# Patient Record
Sex: Female | Born: 1950 | Race: Asian | Hispanic: No | Marital: Single | Smoking: Current every day smoker
Health system: Southern US, Community
[De-identification: ages and names within clinical notes are randomized; demographics above are authoritative.]

## PROBLEM LIST (undated history)

## (undated) DIAGNOSIS — I2510 Atherosclerotic heart disease of native coronary artery without angina pectoris: Secondary | ICD-10-CM

## (undated) DIAGNOSIS — I5020 Unspecified systolic (congestive) heart failure: Secondary | ICD-10-CM

## (undated) DIAGNOSIS — K743 Primary biliary cirrhosis: Secondary | ICD-10-CM

## (undated) DIAGNOSIS — K7460 Unspecified cirrhosis of liver: Secondary | ICD-10-CM

## (undated) DIAGNOSIS — G510 Bell's palsy: Secondary | ICD-10-CM

## (undated) HISTORY — DX: Primary biliary cirrhosis: K74.3

## (undated) HISTORY — DX: Unspecified systolic (congestive) heart failure: I50.20

## (undated) HISTORY — PX: FRACTURE SURGERY: SHX138

## (undated) HISTORY — PX: CLAVICLE SURGERY: SHX598

---

## 2007-01-09 ENCOUNTER — Emergency Department (HOSPITAL_COMMUNITY): Admission: EM | Admit: 2007-01-09 | Discharge: 2007-01-09 | Payer: Self-pay | Admitting: Emergency Medicine

## 2007-01-13 ENCOUNTER — Ambulatory Visit (HOSPITAL_COMMUNITY): Admission: RE | Admit: 2007-01-13 | Discharge: 2007-01-13 | Payer: Self-pay | Admitting: Orthopedic Surgery

## 2010-12-03 NOTE — Consult Note (Signed)
NAMEEKTA, DANCER NO.:  [id number]   MEDICAL RECORD NO.:  [id number]          PATIENT TYPE:  EMS   LOCATION:  MAJO                         FACILITY:  [facility]   PHYSICIAN:  Artist Pais. Weingold, M.D.DATE OF BIRTH:  02/21/1951   DATE OF CONSULTATION:  DATE OF DISCHARGE:                                 CONSULTATION   Physician requesting consultation is Dr. Berenice Bouton   REASON FOR CONSULTATION:  Ms. Helon is a 60 year old right-hand dominant  female who fell on to her outstretched left upper extremity, presents  today with a displaced fracture distal radias, nondominant, left side.  She is 55.   She has no known drug allergies.   She is currently taking no medications.   No recent hospitalizations or surgery.   FAMILY MEDICAL HISTORY:  Noncontributory.   SOCIAL HISTORY:  Noncontributory.   PHYSICAL EXAMINATION:  GENERAL:  Well-nourished female , pleasant, alert  and oriented x3.  EXTREMITIES:  Examination of her upper extremities, she has pain,  swelling.  She is neurovascularly intact grossly with medial,  radial  and ulnar nerve function.  She complains of intermittent subjective  numbness and tingling in the medial distribution, but she has full  muscle function and sharp, dull sensation is intact.  Again, dorsal  swelling is noted with mild deformity.  Right hand exam is normal by  comparison.   X-ray showed a displaced fracture distal radias.   Patient was given a 2% plain lidocaine hematoma block, was placed in  finger-trap traction, was reduced.  Post reduction film showed accurate  reduction about the AP, but the lateral film still showed some residual  dorsal angulation.  She is discharged with Percocet for pain.  Follow up  in my office on Tuesday.  I am going to recommend to her we intervene  with surgical intervention to realign this displaced distal radias  fracture.  She will call me immediately with any signs of impending  compartment  syndrome; if not, I will see her in my office on Tuesday.      Artist Pais Mina Marble, M.D.  Electronically Signed     MAW/MEDQ  D:  01/09/2007  T:  01/10/2007  Job:  [id number]

## 2021-05-01 ENCOUNTER — Other Ambulatory Visit: Payer: Self-pay

## 2021-05-01 ENCOUNTER — Emergency Department (HOSPITAL_BASED_OUTPATIENT_CLINIC_OR_DEPARTMENT_OTHER): Payer: Self-pay

## 2021-05-01 ENCOUNTER — Emergency Department (HOSPITAL_BASED_OUTPATIENT_CLINIC_OR_DEPARTMENT_OTHER)
Admission: EM | Admit: 2021-05-01 | Discharge: 2021-05-01 | Disposition: A | Discharge status: Home / Self Care | Payer: Self-pay | Attending: Emergency Medicine | Admitting: Emergency Medicine

## 2021-05-01 ENCOUNTER — Encounter (HOSPITAL_BASED_OUTPATIENT_CLINIC_OR_DEPARTMENT_OTHER): Payer: Self-pay

## 2021-05-01 DIAGNOSIS — R188 Other ascites: Secondary | ICD-10-CM

## 2021-05-01 DIAGNOSIS — R339 Retention of urine, unspecified: Secondary | ICD-10-CM | POA: Insufficient documentation

## 2021-05-01 DIAGNOSIS — F17210 Nicotine dependence, cigarettes, uncomplicated: Secondary | ICD-10-CM | POA: Insufficient documentation

## 2021-05-01 DIAGNOSIS — K7031 Alcoholic cirrhosis of liver with ascites: Secondary | ICD-10-CM | POA: Insufficient documentation

## 2021-05-01 HISTORY — DX: Bell's palsy: G51.0

## 2021-05-01 LAB — HEPATIC FUNCTION PANEL
ALT: 20 U/L (ref 0–44)
AST: 65 U/L — ABNORMAL HIGH (ref 15–41)
Albumin: 1.7 g/dL — ABNORMAL LOW (ref 3.5–5.0)
Alkaline Phosphatase: 396 U/L — ABNORMAL HIGH (ref 38–126)
Bilirubin, Direct: 3.2 mg/dL — ABNORMAL HIGH (ref 0.0–0.2)
Indirect Bilirubin: 2.7 mg/dL — ABNORMAL HIGH (ref 0.3–0.9)
Total Bilirubin: 5.9 mg/dL — ABNORMAL HIGH (ref 0.3–1.2)
Total Protein: 7.4 g/dL (ref 6.5–8.1)

## 2021-05-01 LAB — BASIC METABOLIC PANEL
Anion gap: 6 (ref 5–15)
BUN: 13 mg/dL (ref 8–23)
CO2: 22 mmol/L (ref 22–32)
Calcium: 7.7 mg/dL — ABNORMAL LOW (ref 8.9–10.3)
Chloride: 105 mmol/L (ref 98–111)
Creatinine, Ser: 0.77 mg/dL (ref 0.44–1.00)
GFR, Estimated: >60 mL/min (ref >60)
Glucose, Bld: 89 mg/dL (ref 70–99)
Potassium: 3.8 mmol/L (ref 3.5–5.1)
Sodium: 133 mmol/L — ABNORMAL LOW (ref 135–145)

## 2021-05-01 LAB — CBC WITH DIFFERENTIAL/PLATELET
Abs Immature Granulocytes: 0.03 10*3/uL (ref 0.00–0.07)
Basophils Absolute: 0 10*3/uL (ref 0.0–0.1)
Basophils Relative: 1 %
Eosinophils Absolute: 0.1 10*3/uL (ref 0.0–0.5)
Eosinophils Relative: 1 %
HCT: 26.1 % — ABNORMAL LOW (ref 36.0–46.0)
Hemoglobin: 8.5 g/dL — ABNORMAL LOW (ref 12.0–15.0)
Immature Granulocytes: 0 %
Lymphocytes Relative: 25 %
Lymphs Abs: 2 10*3/uL (ref 0.7–4.0)
MCH: 30.7 pg (ref 26.0–34.0)
MCHC: 32.6 g/dL (ref 30.0–36.0)
MCV: 94.2 fL (ref 80.0–100.0)
Monocytes Absolute: 0.5 10*3/uL (ref 0.1–1.0)
Monocytes Relative: 6 %
Neutro Abs: 5.4 10*3/uL (ref 1.7–7.7)
Neutrophils Relative %: 67 %
Platelets: 241 10*3/uL (ref 150–400)
RBC: 2.77 MIL/uL — ABNORMAL LOW (ref 3.87–5.11)
RDW: 17.5 % — ABNORMAL HIGH (ref 11.5–15.5)
WBC: 8.1 10*3/uL (ref 4.0–10.5)
nRBC: 0 % (ref 0.0–0.2)

## 2021-05-01 LAB — URINALYSIS, ROUTINE W REFLEX MICROSCOPIC
Glucose, UA: NEGATIVE mg/dL
Hgb urine dipstick: NEGATIVE
Ketones, ur: NEGATIVE mg/dL
Leukocytes,Ua: NEGATIVE
Nitrite: NEGATIVE
Protein, ur: NEGATIVE mg/dL
Specific Gravity, Urine: 1.02 (ref 1.005–1.030)
pH: 5.5 (ref 5.0–8.0)

## 2021-05-01 MED ORDER — IOHEXOL 300 MG/ML  SOLN
100.0000 mL | Freq: Once | INTRAMUSCULAR | Status: AC | PRN
  Administered 2021-05-01: 100 mL via INTRAVENOUS

## 2021-05-01 NOTE — ED Notes (Signed)
Bladder scan shows >930ml, looks like 1500 or more visually on bladder scanner.

## 2021-05-01 NOTE — ED Notes (Signed)
Standard drainage bag changed to leg bag and Pt and grandson educated on use, draining, and cleaning.    D/c paperwork provided to grandson.  Verbalized all instructions and follow-up.

## 2021-05-01 NOTE — ED Notes (Signed)
.  dc

## 2021-05-01 NOTE — ED Triage Notes (Signed)
Per pt and grandson pt with bilat LE swelling and decreased UO x 1 week-denies taking diuretic-NAD-to triage in w/c

## 2021-05-01 NOTE — ED Notes (Signed)
Patient transported to CT 

## 2021-05-01 NOTE — ED Provider Notes (Signed)
[facility] EMERGENCY DEPARTMENT Provider Note   CSN: [id number] Arrival date & time: 05/01/21  1158     History Chief Complaint  Patient presents with   Leg Swelling   Urinary Retention    Hannah Blake is a 70 y.o. female.  Patient presents to the ED with her grandson. Patient with scant urination x 1 week. Lower abdominal distention. Patient denies pain with urination. No prior episodes of urinary retention. Bladder scan shows > 1 liter of urine. Bilateral lower extremity edema. Denies chest pain, shortness of breath. No back pain or extremity weakness. No significant past medical history. No current medications.  The history is provided by the patient and a relative. No language interpreter was used.  Dysuria Pain severity:  No pain Chronicity:  New Recent urinary tract infections: no   Ineffective treatments:  None tried Urinary symptoms comment:  Scant urine Associated symptoms: no fever, no nausea and no vomiting   Risk factors: no hx of pyelonephritis, no hx of urolithiasis, no recurrent urinary tract infections and no urinary catheter       Past Medical History:  Diagnosis Date   Bell's palsy     There are no problems to display for this patient.   Past Surgical History:  Procedure Laterality Date   CLAVICLE SURGERY       OB History   No obstetric history on file.     No family history on file.  Social History   Tobacco Use   Smoking status: Every Day    Types: Cigarettes   Smokeless tobacco: Never  Vaping Use   Vaping Use: Never used  Substance Use Topics   Alcohol use: Yes    Comment: weekly   Drug use: Never    Home Medications Prior to Admission medications   Not on File    Allergies    Patient has no known allergies.  Review of Systems   Review of Systems  Constitutional:  Negative for fever.  Cardiovascular:  Positive for leg swelling.  Gastrointestinal:  Negative for nausea and vomiting.  Genitourinary:  Positive  for dysuria.  All other systems reviewed and are negative.  Physical Exam Updated Vital Signs BP 116/70 (BP Location: Right Arm)   Pulse 95   Temp 97.8 F (36.6 C) (Oral)   Resp 18   Ht 5\' 2"  (1.575 m)   Wt 55.8 kg   SpO2 95%   BMI 22.50 kg/m   Physical Exam Constitutional:      Appearance: She is not ill-appearing.  HENT:     Head: Normocephalic.     Nose: Nose normal.     Mouth/Throat:     Mouth: Mucous membranes are moist.  Cardiovascular:     Rate and Rhythm: Normal rate and regular rhythm.  Pulmonary:     Effort: Pulmonary effort is normal.     Breath sounds: Normal breath sounds.  Abdominal:     General: There is distension.     Palpations: Abdomen is soft.  Genitourinary:    Vagina: No vaginal discharge.  Musculoskeletal:        General: Swelling present.  Skin:    General: Skin is warm and dry.  Neurological:     Mental Status: She is alert and oriented to person, place, and time.  Psychiatric:        Mood and Affect: Mood normal.        Behavior: Behavior normal.    ED Results / Procedures / Treatments  Labs (all labs ordered are listed, but only abnormal results are displayed) Labs Reviewed  CBC WITH DIFFERENTIAL/PLATELET - Abnormal; Notable for the following components:      Result Value   RBC 2.77 (*)    Hemoglobin 8.5 (*)    HCT 26.1 (*)    RDW 17.5 (*)    All other components within normal limits  BASIC METABOLIC PANEL - Abnormal; Notable for the following components:   Sodium 133 (*)    Calcium 7.7 (*)    All other components within normal limits  URINALYSIS, ROUTINE W REFLEX MICROSCOPIC - Abnormal; Notable for the following components:   Color, Urine AMBER (*)    Bilirubin Urine LARGE (*)    All other components within normal limits    EKG None  Radiology CT ABDOMEN PELVIS W CONTRAST  Result Date: 05/01/2021 CLINICAL DATA:  Urinary tension, decreased urine output for 1 week, BILATERAL lower extremity swelling, abdominal  distension, smoker EXAM: CT ABDOMEN AND PELVIS WITH CONTRAST TECHNIQUE: Multidetector CT imaging of the abdomen and pelvis was performed using the standard protocol following bolus administration of intravenous contrast. Sagittal and coronal MPR images reconstructed from axial data set. CONTRAST:  OMNIPAQUE IOHEXOL 300 MG/ML SOLN IV. No oral contrast. COMPARISON:  None FINDINGS: Lower chest: Minimal at bibasilar atelectasis Hepatobiliary: Slightly irregular contour of the liver suggesting cirrhosis. Single tiny low-attenuation nodule lateral segment LEFT lobe liver 5 mm diameter image 28. Dependent calculi in gallbladder. No additional hepatic mass or biliary dilatation. Pancreas: Normal appearance Spleen: Appears upper normal sized, elongated but thin, 10.6 x 4.8 x 14.6 cm (volume = 390 ml) Adrenals/Urinary Tract: Adrenal glands and kidneys normal appearance. No ureteral calcifications or hydronephrosis. Bladder decompressed by Foley catheter. Stomach/Bowel: Stomach decompressed, grossly unremarkable. Large and small bowel loops normal appearance. Appendix not identified. Vascular/Lymphatic: Atherosclerotic calcifications aorta and iliac arteries without aneurysm. Upper normal sized lymph nodes periportal and aortocaval, nonspecific. No definite adenopathy. Reproductive: Atrophic uterus and RIGHT ovary. LEFT ovary appears enlarged versus RIGHT ovary and contains a 2.9 cm diameter cyst, coexistent solid mass not excluded. Other: Significant ascites throughout abdomen and pelvis. Ascites extends into a paraesophageal hernia, small volume. Scattered subcutaneous edema. Musculoskeletal: Osseous demineralization. Superior endplate compression fractures of T6, T8, L2, and L5, age indeterminate. Levoconvex thoracolumbar scoliosis. IMPRESSION: Question cirrhotic liver with significant ascites throughout abdomen and pelvis. Cholelithiasis. Small paraesophageal hernia containing ascites. Age indeterminate superior  endplate compression fractures of T6, T8, L2, and L5. Asymmetric enlargement of LEFT ovary containing a 2.9 cm diameter cyst, unable to exclude solid soft tissue component/complicated cystic lesion; follow-up sonographic evaluation recommended. Aortic Atherosclerosis (ICD10-I70.0). Electronically Signed   By: Ulyses Southward M.D.   On: 05/01/2021 17:55    Procedures Procedures   Medications Ordered in ED Medications - No data to display  ED Course  I have reviewed the triage vital signs and the nursing notes.  Pertinent labs & imaging results that were available during my care of the patient were reviewed by me and considered in my medical decision making (see chart for details).  Foley catheter placed by nursing staff. Output of 300 cc.    MDM Rules/Calculators/A&P                           Patient presented for abdominal distention and difficulty urinating. Bladder scan revealed > 999 ml. Foley cath placed by nursing staff with mild difficulty, returning 300 cc of discolored urine containing  bilirubin. Patient indicates immediate relief of discomfort. Repeat bladder scan still reveals retention. No indication of UTI. CT scan of abdomen obtained. Results reviewed and reveal likely cirrhotic liver with ascites and gall stones. Patient is only an occasional consumer of alcohol. Patient without abdominal pain,nausea, vomiting. Stable vital signs. Patient does not want to stay for additional lab testing. Discussed with Dr. Rubin Payor. Will add liver function and hepatitis screening labs. Patient to follow-up with urology and gastroenterology. Care instructions and return precautions provided. Patient appear safe for discharge at this time. Final Clinical Impression(s) / ED Diagnoses Final diagnoses:  Urinary retention  Cirrhosis of liver with ascites, unspecified hepatic cirrhosis type [facility])    Rx / DC Orders ED Discharge Orders     None        Felicie Morn, NP 05/01/21 2308    Melene Plan, DO 05/04/21 0700

## 2021-05-01 NOTE — Discharge Instructions (Signed)
Please refer to the attached instructions. It is important to follow up with both urology and gastroenterology. Please contact both services tomorrow to schedule follow up appointments. Be sure to let the urologist office know that you have an indwelling urinary catheter.

## 2021-05-03 LAB — HEPATITIS C ANTIBODY: HCV Ab: 0.2 s/co ratio — AB (ref 0.0–0.9)

## 2021-05-03 LAB — HEPATITIS PANEL, ACUTE
Hep A IgM: NONREACTIVE
Hep B C IgM: NONREACTIVE
Hepatitis B Surface Ag: NONREACTIVE

## 2021-05-20 ENCOUNTER — Emergency Department (HOSPITAL_BASED_OUTPATIENT_CLINIC_OR_DEPARTMENT_OTHER): Payer: Medicare Other

## 2021-05-20 ENCOUNTER — Other Ambulatory Visit: Payer: Self-pay

## 2021-05-20 ENCOUNTER — Inpatient Hospital Stay (HOSPITAL_BASED_OUTPATIENT_CLINIC_OR_DEPARTMENT_OTHER)
Admission: EM | Admit: 2021-05-20 | Discharge: 2021-05-23 | DRG: 432 | Disposition: A | Discharge status: Home Health | Payer: Medicare Other | Attending: Internal Medicine | Admitting: Internal Medicine

## 2021-05-20 ENCOUNTER — Encounter (HOSPITAL_BASED_OUTPATIENT_CLINIC_OR_DEPARTMENT_OTHER): Payer: Self-pay

## 2021-05-20 DIAGNOSIS — E43 Unspecified severe protein-calorie malnutrition: Secondary | ICD-10-CM | POA: Diagnosis present

## 2021-05-20 DIAGNOSIS — D62 Acute posthemorrhagic anemia: Secondary | ICD-10-CM | POA: Diagnosis present

## 2021-05-20 DIAGNOSIS — T83511A Infection and inflammatory reaction due to indwelling urethral catheter, initial encounter: Secondary | ICD-10-CM | POA: Diagnosis present

## 2021-05-20 DIAGNOSIS — R310 Gross hematuria: Secondary | ICD-10-CM | POA: Diagnosis present

## 2021-05-20 DIAGNOSIS — E876 Hypokalemia: Secondary | ICD-10-CM | POA: Diagnosis not present

## 2021-05-20 DIAGNOSIS — I5023 Acute on chronic systolic (congestive) heart failure: Secondary | ICD-10-CM | POA: Diagnosis present

## 2021-05-20 DIAGNOSIS — R2681 Unsteadiness on feet: Secondary | ICD-10-CM

## 2021-05-20 DIAGNOSIS — K5900 Constipation, unspecified: Secondary | ICD-10-CM | POA: Diagnosis present

## 2021-05-20 DIAGNOSIS — N179 Acute kidney failure, unspecified: Secondary | ICD-10-CM | POA: Diagnosis present

## 2021-05-20 DIAGNOSIS — E46 Unspecified protein-calorie malnutrition: Secondary | ICD-10-CM | POA: Insufficient documentation

## 2021-05-20 DIAGNOSIS — E869 Volume depletion, unspecified: Secondary | ICD-10-CM | POA: Diagnosis present

## 2021-05-20 DIAGNOSIS — Y846 Urinary catheterization as the cause of abnormal reaction of the patient, or of later complication, without mention of misadventure at the time of the procedure: Secondary | ICD-10-CM | POA: Diagnosis present

## 2021-05-20 DIAGNOSIS — B9620 Unspecified Escherichia coli [E. coli] as the cause of diseases classified elsewhere: Secondary | ICD-10-CM | POA: Diagnosis present

## 2021-05-20 DIAGNOSIS — K7460 Unspecified cirrhosis of liver: Secondary | ICD-10-CM

## 2021-05-20 DIAGNOSIS — N390 Urinary tract infection, site not specified: Secondary | ICD-10-CM | POA: Diagnosis present

## 2021-05-20 DIAGNOSIS — I509 Heart failure, unspecified: Secondary | ICD-10-CM

## 2021-05-20 DIAGNOSIS — Z6822 Body mass index (BMI) 22.0-22.9, adult: Secondary | ICD-10-CM

## 2021-05-20 DIAGNOSIS — R338 Other retention of urine: Secondary | ICD-10-CM

## 2021-05-20 DIAGNOSIS — G510 Bell's palsy: Secondary | ICD-10-CM | POA: Diagnosis present

## 2021-05-20 DIAGNOSIS — E872 Acidosis, unspecified: Secondary | ICD-10-CM | POA: Diagnosis present

## 2021-05-20 DIAGNOSIS — R5381 Other malaise: Secondary | ICD-10-CM | POA: Diagnosis present

## 2021-05-20 DIAGNOSIS — F17210 Nicotine dependence, cigarettes, uncomplicated: Secondary | ICD-10-CM | POA: Diagnosis present

## 2021-05-20 DIAGNOSIS — Z20822 Contact with and (suspected) exposure to covid-19: Secondary | ICD-10-CM | POA: Diagnosis present

## 2021-05-20 DIAGNOSIS — K7031 Alcoholic cirrhosis of liver with ascites: Principal | ICD-10-CM | POA: Diagnosis present

## 2021-05-20 DIAGNOSIS — I5021 Acute systolic (congestive) heart failure: Secondary | ICD-10-CM

## 2021-05-20 DIAGNOSIS — R188 Other ascites: Secondary | ICD-10-CM

## 2021-05-20 DIAGNOSIS — B1920 Unspecified viral hepatitis C without hepatic coma: Secondary | ICD-10-CM | POA: Diagnosis present

## 2021-05-20 DIAGNOSIS — Z79899 Other long term (current) drug therapy: Secondary | ICD-10-CM

## 2021-05-20 DIAGNOSIS — R319 Hematuria, unspecified: Secondary | ICD-10-CM

## 2021-05-20 LAB — LIPASE, BLOOD: Lipase: 51 U/L (ref 11–51)

## 2021-05-20 LAB — RESP PANEL BY RT-PCR (FLU A&B, COVID) ARPGX2
Influenza A by PCR: NEGATIVE
Influenza B by PCR: NEGATIVE
SARS Coronavirus 2 by RT PCR: NEGATIVE

## 2021-05-20 LAB — COMPREHENSIVE METABOLIC PANEL
ALT: 24 U/L (ref 0–44)
AST: 72 U/L — ABNORMAL HIGH (ref 15–41)
Albumin: <1.5 g/dL — ABNORMAL LOW (ref 3.5–5.0)
Alkaline Phosphatase: 323 U/L — ABNORMAL HIGH (ref 38–126)
Anion gap: 9 (ref 5–15)
BUN: 28 mg/dL — ABNORMAL HIGH (ref 8–23)
CO2: 20 mmol/L — ABNORMAL LOW (ref 22–32)
Calcium: 7.7 mg/dL — ABNORMAL LOW (ref 8.9–10.3)
Chloride: 106 mmol/L (ref 98–111)
Creatinine, Ser: 1.8 mg/dL — ABNORMAL HIGH (ref 0.44–1.00)
GFR, Estimated: 30 mL/min — ABNORMAL LOW (ref >60)
Glucose, Bld: 108 mg/dL — ABNORMAL HIGH (ref 70–99)
Potassium: 3.9 mmol/L (ref 3.5–5.1)
Sodium: 135 mmol/L (ref 135–145)
Total Bilirubin: 6 mg/dL — ABNORMAL HIGH (ref 0.3–1.2)
Total Protein: 7.7 g/dL (ref 6.5–8.1)

## 2021-05-20 LAB — CREATININE, SERUM
Creatinine, Ser: 1.41 mg/dL — ABNORMAL HIGH (ref 0.44–1.00)
GFR, Estimated: 40 mL/min — ABNORMAL LOW (ref >60)

## 2021-05-20 LAB — URINALYSIS, MICROSCOPIC (REFLEX): RBC / HPF: >50 RBC/hpf (ref 0–5)

## 2021-05-20 LAB — CBC WITH DIFFERENTIAL/PLATELET
Abs Immature Granulocytes: 0.09 10*3/uL — ABNORMAL HIGH (ref 0.00–0.07)
Basophils Absolute: 0.1 10*3/uL (ref 0.0–0.1)
Basophils Relative: 0 %
Eosinophils Absolute: 0.1 10*3/uL (ref 0.0–0.5)
Eosinophils Relative: 1 %
HCT: 27.8 % — ABNORMAL LOW (ref 36.0–46.0)
Hemoglobin: 9 g/dL — ABNORMAL LOW (ref 12.0–15.0)
Immature Granulocytes: 1 %
Lymphocytes Relative: 17 %
Lymphs Abs: 2.3 10*3/uL (ref 0.7–4.0)
MCH: 31 pg (ref 26.0–34.0)
MCHC: 32.4 g/dL (ref 30.0–36.0)
MCV: 95.9 fL (ref 80.0–100.0)
Monocytes Absolute: 0.6 10*3/uL (ref 0.1–1.0)
Monocytes Relative: 4 %
Neutro Abs: 10.5 10*3/uL — ABNORMAL HIGH (ref 1.7–7.7)
Neutrophils Relative %: 77 %
Platelets: 184 10*3/uL (ref 150–400)
RBC: 2.9 MIL/uL — ABNORMAL LOW (ref 3.87–5.11)
RDW: 18.5 % — ABNORMAL HIGH (ref 11.5–15.5)
WBC: 13.6 10*3/uL — ABNORMAL HIGH (ref 4.0–10.5)
nRBC: 0 % (ref 0.0–0.2)

## 2021-05-20 LAB — LACTIC ACID, PLASMA
Lactic Acid, Venous: 1.5 mmol/L (ref 0.5–1.9)
Lactic Acid, Venous: 1.4 mmol/L (ref 0.5–1.9)

## 2021-05-20 LAB — URINALYSIS, ROUTINE W REFLEX MICROSCOPIC

## 2021-05-20 LAB — BODY FLUID CELL COUNT WITH DIFFERENTIAL
Eos, Fluid: 0 %
Lymphs, Fluid: 51 %
Monocyte-Macrophage-Serous Fluid: 32 % — ABNORMAL LOW (ref 50–90)
Neutrophil Count, Fluid: 17 % (ref 0–25)
Total Nucleated Cell Count, Fluid: 86 cu mm (ref 0–1000)

## 2021-05-20 LAB — PHOSPHORUS: Phosphorus: 4.2 mg/dL (ref 2.5–4.6)

## 2021-05-20 LAB — CBC
HCT: 27 % — ABNORMAL LOW (ref 36.0–46.0)
Hemoglobin: 8.6 g/dL — ABNORMAL LOW (ref 12.0–15.0)
MCH: 31.5 pg (ref 26.0–34.0)
MCHC: 31.9 g/dL (ref 30.0–36.0)
MCV: 98.9 fL (ref 80.0–100.0)
Platelets: 158 K/uL (ref 150–400)
RBC: 2.73 MIL/uL — ABNORMAL LOW (ref 3.87–5.11)
RDW: 18.8 % — ABNORMAL HIGH (ref 11.5–15.5)
WBC: 13.6 K/uL — ABNORMAL HIGH (ref 4.0–10.5)
nRBC: 0 % (ref 0.0–0.2)

## 2021-05-20 LAB — PROTIME-INR
INR: 1.4 — ABNORMAL HIGH (ref 0.8–1.2)
Prothrombin Time: 16.9 seconds — ABNORMAL HIGH (ref 11.4–15.2)

## 2021-05-20 LAB — BRAIN NATRIURETIC PEPTIDE: B Natriuretic Peptide: 689.8 pg/mL — ABNORMAL HIGH (ref 0.0–100.0)

## 2021-05-20 LAB — MAGNESIUM: Magnesium: 2.2 mg/dL (ref 1.7–2.4)

## 2021-05-20 MED ORDER — LIDOCAINE-EPINEPHRINE (PF) 2 %-1:200000 IJ SOLN
10.0000 mL | Freq: Once | INTRAMUSCULAR | Status: AC
  Administered 2021-05-20: 10 mL via INTRADERMAL
  Filled 2021-05-20: qty 20

## 2021-05-20 MED ORDER — ALBUMIN HUMAN 25 % IV SOLN
12.5000 g | Freq: Four times a day (QID) | INTRAVENOUS | Status: AC
  Administered 2021-05-21 (×3): 12.5 g via INTRAVENOUS
  Filled 2021-05-20 (×3): qty 50

## 2021-05-20 MED ORDER — PROCHLORPERAZINE EDISYLATE 10 MG/2ML IJ SOLN: 10.0000 mg | Freq: Four times a day (QID) | INTRAMUSCULAR | Status: DC | PRN

## 2021-05-20 MED ORDER — MELATONIN 3 MG PO TABS
3.0000 mg | ORAL_TABLET | Freq: Every evening | ORAL | Status: DC | PRN
  Administered 2021-05-21: 3 mg via ORAL
  Filled 2021-05-20: qty 1

## 2021-05-20 MED ORDER — ENSURE ENLIVE PO LIQD
237.0000 mL | Freq: Two times a day (BID) | ORAL | Status: DC
  Administered 2021-05-21 – 2021-05-23 (×4): 237 mL via ORAL

## 2021-05-20 MED ORDER — ENOXAPARIN SODIUM 30 MG/0.3ML IJ SOSY
30.0000 mg | PREFILLED_SYRINGE | INTRAMUSCULAR | Status: DC
  Administered 2021-05-21: 30 mg via SUBCUTANEOUS
  Filled 2021-05-20: qty 0.3

## 2021-05-20 MED ORDER — POLYETHYLENE GLYCOL 3350 17 G PO PACK: 17.0000 g | PACK | Freq: Every day | ORAL | Status: DC | PRN

## 2021-05-20 MED ORDER — SODIUM CHLORIDE 0.9 % IV SOLN
1.0000 g | Freq: Once | INTRAVENOUS | Status: AC
  Administered 2021-05-20: 1 g via INTRAVENOUS
  Filled 2021-05-20: qty 10

## 2021-05-20 MED ORDER — SODIUM CHLORIDE 0.9 % IV BOLUS
500.0000 mL | Freq: Once | INTRAVENOUS | Status: AC
  Administered 2021-05-20: 500 mL via INTRAVENOUS

## 2021-05-20 NOTE — ED Notes (Signed)
2nd Lactic acid not need per provider.

## 2021-05-20 NOTE — Care Plan (Signed)
70 yo F with scant prior healthcare but recent urinary retention with new foley, and recently diagnosed (in the ER?) cirrhosis presents with leg swelling, ascites and gross hematuria.  Looks like no medical care until she went to [facility] 3 weeks ago with urinary retention, sent home with Foley and Urology follow up (hasn't seen them yet).  Also incidentally noted to have cirrhosis, I think during that ER visit, was supposed to see GI.    Now returns with ascites and leg swelling.   In the ER, INR 1.4, Tbili 6 (stable from 3 weeks ago), Cr up to 1.8, albumin <1.5, and WBC 13K.  MELD 25.  Dr. Adela Lank spoke with GI Leonides Schanz who recommended tap, his note says 2L removed.  Cell count and culture sent.  GI will consult when she arrives.  HR 90-100, so lactic acid, Bcx and Ucx drawn, started on antibiotics.  To med surg bed.

## 2021-05-20 NOTE — ED Notes (Signed)
At bedside to establish IV access , obtain blood sep as per MD orders. Very pleasant, alert 70yo asian female on stretcher, appears in no distress upon initial encounter, did note LLE swelling, also noted indwelling catheter in place with leg bag. Alert and oriented, follows commands without issues.

## 2021-05-20 NOTE — ED Notes (Signed)
Resting quietly and comfortably, family at bedside, safety measures in place, awaiting test results and further orders.

## 2021-05-20 NOTE — ED Provider Notes (Addendum)
[place] EMERGENCY DEPARTMENT Provider Note   CSN: [id number] Arrival date & time: 05/20/21  1104     History Chief Complaint  Patient presents with   Hematuria   Leg Swelling    Hannah Blake is a 70 y.o. female.  70 yo  F with a chief complaint of lower extremity edema abdominal cramping.  This been going on for about a week or so.  She has been having some worsening symptoms after being seen in the ED for some leg swelling previously.  She has not followed up.  She does not have a doctor that she sees normally in the office.  She had urinary retention and had a Foley catheter placed.  She has not followed up with urology.  Feels that her urine is now gotten bloody.  Denies fevers denies flank pain.  Pain to the abdomen is usually worse when she sits up.  Feels it feels that makes her swelling worse.  Denies difficulty breathing denies chest pain or pressure.  The history is provided by the patient and a relative.  Hematuria This is a new problem. The current episode started more than 2 days ago. The problem occurs constantly. The problem has not changed since onset.Associated symptoms include abdominal pain. Pertinent negatives include no chest pain, no headaches and no shortness of breath. Nothing aggravates the symptoms. Nothing relieves the symptoms. She has tried nothing for the symptoms. The treatment provided no relief.      Past Medical History:  Diagnosis Date   Bell's palsy     There are no problems to display for this patient.   Past Surgical History:  Procedure Laterality Date   CLAVICLE SURGERY     FRACTURE SURGERY       OB History   No obstetric history on file.     No family history on file.  Social History   Tobacco Use   Smoking status: Every Day    Types: Cigarettes   Smokeless tobacco: Never  Vaping Use   Vaping Use: Never used  Substance Use Topics   Alcohol use: Not Currently    Comment: weekly   Drug use: Never    Home  Medications Prior to Admission medications   Not on File    Allergies    Patient has no known allergies.  Review of Systems   Review of Systems  Constitutional:  Negative for chills and fever.  HENT:  Negative for congestion and rhinorrhea.   Eyes:  Negative for redness and visual disturbance.  Respiratory:  Negative for shortness of breath and wheezing.   Cardiovascular:  Positive for leg swelling. Negative for chest pain and palpitations.  Gastrointestinal:  Positive for abdominal distention and abdominal pain. Negative for nausea and vomiting.  Genitourinary:  Positive for hematuria. Negative for dysuria and urgency.  Musculoskeletal:  Negative for arthralgias and myalgias.  Skin:  Negative for pallor and wound.  Neurological:  Negative for dizziness and headaches.   Physical Exam Updated Vital Signs BP 102/63   Pulse 92   Temp 98.6 F (37 C) (Oral)   Resp 13   Ht 5\' 3"  (1.6 m)   Wt 56.9 kg   SpO2 100%   BMI 22.21 kg/m   Physical Exam Vitals and nursing note reviewed.  Constitutional:      General: She is not in acute distress.    Appearance: She is well-developed. She is not diaphoretic.  HENT:     Head: Normocephalic and atraumatic.  Comments: Left facial nerve palsy Eyes:     Pupils: Pupils are equal, round, and reactive to light.  Cardiovascular:     Rate and Rhythm: Normal rate and regular rhythm.     Heart sounds: No murmur heard.   No friction rub. No gallop.  Pulmonary:     Effort: Pulmonary effort is normal.     Breath sounds: No wheezing or rales.  Abdominal:     General: There is distension.     Palpations: Abdomen is soft.     Tenderness: There is no abdominal tenderness.     Comments: No obvious tenderness.   Musculoskeletal:        General: No tenderness.     Cervical back: Normal range of motion and neck supple.  Skin:    General: Skin is warm and dry.  Neurological:     Mental Status: She is alert and oriented to person, place, and  time.  Psychiatric:        Behavior: Behavior normal.    ED Results / Procedures / Treatments   Labs (all labs ordered are listed, but only abnormal results are displayed) Labs Reviewed  URINALYSIS, ROUTINE W REFLEX MICROSCOPIC - Abnormal; Notable for the following components:      Result Value   Color, Urine RED (*)    APPearance TURBID (*)    Glucose, UA   (*)    Value: TEST NOT REPORTED DUE TO COLOR INTERFERENCE OF URINE PIGMENT   Hgb urine dipstick   (*)    Value: TEST NOT REPORTED DUE TO COLOR INTERFERENCE OF URINE PIGMENT   Bilirubin Urine   (*)    Value: TEST NOT REPORTED DUE TO COLOR INTERFERENCE OF URINE PIGMENT   Ketones, ur   (*)    Value: TEST NOT REPORTED DUE TO COLOR INTERFERENCE OF URINE PIGMENT   Protein, ur   (*)    Value: TEST NOT REPORTED DUE TO COLOR INTERFERENCE OF URINE PIGMENT   Nitrite   (*)    Value: TEST NOT REPORTED DUE TO COLOR INTERFERENCE OF URINE PIGMENT   Leukocytes,Ua   (*)    Value: TEST NOT REPORTED DUE TO COLOR INTERFERENCE OF URINE PIGMENT   All other components within normal limits  CBC WITH DIFFERENTIAL/PLATELET - Abnormal; Notable for the following components:   WBC 13.6 (*)    RBC 2.90 (*)    Hemoglobin 9.0 (*)    HCT 27.8 (*)    RDW 18.5 (*)    Neutro Abs 10.5 (*)    Abs Immature Granulocytes 0.09 (*)    All other components within normal limits  COMPREHENSIVE METABOLIC PANEL - Abnormal; Notable for the following components:   CO2 20 (*)    Glucose, Bld 108 (*)    BUN 28 (*)    Creatinine, Ser 1.80 (*)    Calcium 7.7 (*)    Albumin <1.5 (*)    AST 72 (*)    Alkaline Phosphatase 323 (*)    Total Bilirubin 6.0 (*)    GFR, Estimated 30 (*)    All other components within normal limits  PROTIME-INR - Abnormal; Notable for the following components:   Prothrombin Time 16.9 (*)    INR 1.4 (*)    All other components within normal limits  URINALYSIS, MICROSCOPIC (REFLEX) - Abnormal; Notable for the following components:    Bacteria, UA MANY (*)    All other components within normal limits  BODY FLUID CULTURE W GRAM STAIN  RESP  PANEL BY RT-PCR (FLU A&B, COVID) ARPGX2  LIPASE, BLOOD  LACTATE DEHYDROGENASE, PLEURAL OR PERITONEAL FLUID  GLUCOSE, PLEURAL OR PERITONEAL FLUID  PROTEIN, PLEURAL OR PERITONEAL FLUID  ALBUMIN, PLEURAL OR PERITONEAL FLUID   CYTOLOGY - NON PAP    EKG EKG Interpretation  Date/Time:  Monday May 20 2021 11:28:19 EDT Ventricular Rate:  97 PR Interval:  149 QRS Duration: 99 QT Interval:  417 QTC Calculation: 530 R Axis:   39 Text Interpretation: Sinus rhythm Abnrm T, consider ischemia, anterolateral lds Prolonged QT interval No old tracing to compare Confirmed by Melene Plan [phone]) on 05/20/2021 11:30:08 AM  Radiology DG Chest Port 1 View  Result Date: 05/20/2021 CLINICAL DATA:  Acute kidney injury, hematuria, lower extremity swelling EXAM: PORTABLE CHEST 1 VIEW COMPARISON:  None. FINDINGS: Low lung volumes. Mild enlargement of the cardiopericardial silhouette. Atherosclerotic aortic arch. Otherwise normal mediastinal contour. No pneumothorax. No pleural effusion. Moderate eventration of the right hemidiaphragm. No overt pulmonary edema. Streaky bibasilar lung opacities compatible with atelectasis. IMPRESSION: 1. Low lung volumes with streaky bibasilar lung opacities compatible with atelectasis. Moderate eventration of the right hemidiaphragm. 2. Mild enlargement of the cardiopericardial silhouette without overt pulmonary edema. Electronically Signed   By: Delbert Phenix M.D.   On: 05/20/2021 14:39    Procedures .Paracentesis  Date/Time: 05/20/2021 3:08 PM Performed by: Melene Plan, DO Authorized by: Melene Plan, DO   Consent:    Consent obtained:  Verbal   Consent given by:  Patient and healthcare agent   Risks, benefits, and alternatives were discussed: yes     Risks discussed:  Bleeding, bowel perforation, infection and pain   Alternatives discussed:  No treatment and  delayed treatment Universal protocol:    Procedure explained and questions answered to patient or proxy's satisfaction: yes     Immediately prior to procedure, a time out was called: yes     Patient identity confirmed:  Verbally with patient Pre-procedure details:    Procedure purpose:  Diagnostic   Preparation: Patient was prepped and draped in usual sterile fashion   Anesthesia:    Anesthesia method:  Topical application Procedure details:    Needle gauge:  18   Ultrasound guidance: yes     Puncture site:  R lower quadrant   Fluid removed amount:  2L   Fluid appearance:  Amber   Dressing:  Adhesive bandage Post-procedure details:    Procedure completion:  Tolerated well, no immediate complications    Medications Ordered in ED Medications  sodium chloride 0.9 % bolus 500 mL (has no administration in time range)  lidocaine-EPINEPHrine (XYLOCAINE W/EPI) 2 %-1:200000 (PF) injection 10 mL (10 mLs Intradermal Given by Other 05/20/21 1512)    ED Course  I have reviewed the triage vital signs and the nursing notes.  Pertinent labs & imaging results that were available during my care of the patient were reviewed by me and considered in my medical decision making (see chart for details).  Clinical Course as of 05/20/21 1514  Mon May 20, 2021  1504 New cirrhosis, new AKI, +/- hepatorenal, will be admitted, paracentesis done [MK]    Clinical Course User Index [MK] Kommor, [place], MD   MDM Rules/Calculators/A&P                           70 yo F with what sounds like undiagnosed cirrhosis.  Going on here recently.  No significant tenderness on abdominal exam for me.  Slight jaundice.  Will obtain a laboratory evaluation.  Discussed with G, Dr. Lorenso Courier, recommended paracentesis and then would see the patient in the hospital.  Patient does have an AKI here.  LFTs appear to be similar to her last visit a couple weeks ago.  Paracentesis with serous fluid without obvious cloudiness.  Took  off 2 L without significant issue.  Of note the patient used to be an everyday drinker would have at least 2 beers a day.  Stopped a couple months ago with the swelling.  Bedside ultrasound also did not show any obvious urinary retention.  Less than 200 cc noted in the bladder.   The patients results and plan were reviewed and discussed.   Any x-rays performed were independently reviewed by myself.   Differential diagnosis were considered with the presenting HPI.  Medications  sodium chloride 0.9 % bolus 500 mL (has no administration in time range)  lidocaine-EPINEPHrine (XYLOCAINE W/EPI) 2 %-1:200000 (PF) injection 10 mL (10 mLs Intradermal Given by Other 05/20/21 1512)    Vitals:   05/20/21 1124 05/20/21 1215 05/20/21 1245 05/20/21 1345  BP:  102/65 107/63 102/63  Pulse:  94 93 92  Resp:  14 15 13   Temp: 98.6 F (37 C)     TempSrc: Oral     SpO2:  96% 97% 100%  Weight:      Height:        Final diagnoses:  AKI (acute kidney injury) ([place])  Alcoholic cirrhosis of liver with ascites ([place])    Admission/ observation were discussed with the admitting physician, patient and/or family and they are comfortable with the plan.     Final Clinical Impression(s) / ED Diagnoses Final diagnoses:  AKI (acute kidney injury) ([place])  Alcoholic cirrhosis of liver with ascites [facility])    Rx / DC Orders ED Discharge Orders     None        Deno Etienne, DO 05/20/21 [place], DO 05/20/21 1522

## 2021-05-20 NOTE — ED Notes (Signed)
One (1) set of blood cultures obtained due to limited vascular access. BC x 1 to lab prior to Abx administration

## 2021-05-20 NOTE — ED Triage Notes (Addendum)
Pt arrives with c/o blood in urine X2 days and leg swelling. Pt states her legs are very heavy, visibly swollen. Family reports decreased urine output. Pt has left sided facial droop, family reports this is normal for her. Also c/o abdominal distention.

## 2021-05-20 NOTE — ED Notes (Signed)
Attempted to call report to floor, was told she to call this RN back. Informed [facility] approx arrival in 10 minutes.

## 2021-05-20 NOTE — H&P (Addendum)
History and Physical  Hannah Blake DOB: 03/01/1951 DOA: 05/20/2021  Referring physician: Direct admit from [facility] ED, accepted by Dr. Loleta Books, [facility]. PCP: Pcp, No  Outpatient Specialists: None. Patient coming from: Home.  Chief Complaint: Hematuria, legs swelling  HPI: Hannah Blake is a 70 y.o. female with medical history significant for Bell's palsy diagnosed at [place] ED on 03/17/2021, who presented to [facility] ED with complaints of gross hematuria via Foley catheter and worsening abdominal distention and bilateral lower extremity edema.  Patient was seen in the ED previously on 05/01/2021 for acute urinary retention and bilateral lower extremity edema.  During that visit a Foley catheter was inserted for urine retention greater than 1 L from bladder scan.  She was advised to follow-up with urology.  CT abdomen and pelvis with contrast was done and it showed possible cirrhotic liver with ascites and cholelithiasis.  At that time the patient did not want to stay for additional lab testing, she was advised to follow-up with GI.  2 days prior to this presentation she developed gross hematuria which she noticed in her Foley bag.  She also noted worsening abdominal distention and lower extremity edema.  She presented to the ED for further evaluation and management.  Upon presentation to the ED, she had a paracentesis with 2 L of fluid removed by EDP.  EDP discussed case with GI Dr. Lorenso Courier who will see in consultation.  She denies any abdominal pain, nausea or vomiting.  Endorses constipation.  Patient accepted by Dr. Loleta Books, [facility], as a direct admit to [facility] MedSurg unit.  ED Course:  Temperature 97.5.  BP 109/65, pulse 94, respiratory 20, with saturation 100% on room air.  Lab studies remarkable for BUN 28, creatinine 1.80 from 0.77 at baseline, GFR 30, albumin less than 1.5, AST 72, bilirubin 6.0.  WBC 13.6, hemoglobin 9.0, MCV 95, platelet count 184.  UA positive for pyuria.     Review of Systems: Review of systems as noted in the HPI. All other systems reviewed and are negative.   Past Medical History:  Diagnosis Date   Bell's palsy    Past Surgical History:  Procedure Laterality Date   CLAVICLE SURGERY     FRACTURE SURGERY      Social History:  reports that she has been smoking cigarettes. She has never used smokeless tobacco. She reports that she does not currently use alcohol. She reports that she does not use drugs.  Reports previously occasional alcohol use 1 beer per week.   No Known Allergies  Family history: Per her grandson Hannah Blake, no family history of cancer, or GI disease.  Home medications: None  Physical Exam: BP 109/65 (BP Location: Left Arm)   Pulse 94   Temp (!) 97.5 F (36.4 C) (Oral)   Resp 20   Ht 5\' 3"  (1.6 m)   Wt 56.7 kg   SpO2 100%   BMI 22.14 kg/m   General: 70 y.o. year-old female well developed well nourished in no acute distress.  Alert and oriented x3. Cardiovascular: Regular rate and rhythm with no rubs or gallops.  No thyromegaly or JVD noted.  3+ pitting edema in lower extremities bilaterally all the way up to her thighs.   Respiratory: Clear to auscultation with no wheezes or rales. Good inspiratory effort. Abdomen: Soft mildly distended nontender with bowel sounds present.  Post paracentesis. Muskuloskeletal: No cyanosis or clubbing.  3+ pitting edema noted in lower extremities bilaterally Neuro: CN II-XII intact,  strength, sensation, reflexes Skin: No ulcerative lesions noted or rashes Psychiatry: Judgement and insight appear normal. Mood is appropriate for condition and setting          Labs on Admission:  Basic Metabolic Panel: Recent Labs  Lab 05/20/21 1201  NA 135  K 3.9  CL 106  CO2 20*  GLUCOSE 108*  BUN 28*  CREATININE 1.80*  CALCIUM 7.7*   Liver Function Tests: Recent Labs  Lab 05/20/21 1201  AST 72*  ALT 24  ALKPHOS 323*  BILITOT 6.0*  PROT 7.7  ALBUMIN <1.5*   Recent Labs   Lab 05/20/21 1201  LIPASE 51   No results for input(s): AMMONIA in the last 168 hours. CBC: Recent Labs  Lab 05/20/21 1201  WBC 13.6*  NEUTROABS 10.5*  HGB 9.0*  HCT 27.8*  MCV 95.9  PLT 184   Cardiac Enzymes: No results for input(s): CKTOTAL, CKMB, CKMBINDEX, TROPONINI in the last 168 hours.  BNP (last 3 results) No results for input(s): BNP in the last 8760 hours.  ProBNP (last 3 results) No results for input(s): PROBNP in the last 8760 hours.  CBG: No results for input(s): GLUCAP in the last 168 hours.  Radiological Exams on Admission: DG Chest Port 1 View  Result Date: 05/20/2021 CLINICAL DATA:  Acute kidney injury, hematuria, lower extremity swelling EXAM: PORTABLE CHEST 1 VIEW COMPARISON:  None. FINDINGS: Low lung volumes. Mild enlargement of the cardiopericardial silhouette. Atherosclerotic aortic arch. Otherwise normal mediastinal contour. No pneumothorax. No pleural effusion. Moderate eventration of the right hemidiaphragm. No overt pulmonary edema. Streaky bibasilar lung opacities compatible with atelectasis. IMPRESSION: 1. Low lung volumes with streaky bibasilar lung opacities compatible with atelectasis. Moderate eventration of the right hemidiaphragm. 2. Mild enlargement of the cardiopericardial silhouette without overt pulmonary edema. Electronically Signed   By: Delbert PhenixJason A Poff M.D.   On: 05/20/2021 14:39    EKG: I independently viewed the EKG done and my findings are as followed: Sinus rhythm rate of 97.  Nonspecific ST changes.  QTc 530.  Assessment/Plan Present on Admission:  AKI (acute kidney injury) (HCC)  Active Problems:   AKI (acute kidney injury) (HCC)  AKI, suspect prerenal secondary to intravascular volume depletion Received IV contrast on 05/01/2021. Baseline creatinine 0.77 with GFR greater than 60 Presented with creatinine of 1.80 with GFR of 30 Avoid nephrotoxic agents, dehydration and hypotension. Give IV albumin infusion 12.5 g every 6  hours x 3 doses. Closely monitor urine output Check daily renal function.  Suspected cirrhosis with large ascites status post paracentesis on 05/20/2021 by EDP Dr. Adela LankFloyd. 2 L fluid removed from paracentesis Endorses occasional use of alcohol, about 1 beer per week. Meld score 24, 19.6% estimated 3 months mortality GI consulted by EDP Sodium restriction less than 2 g/day  Elevated liver chemistries in the setting of alcohol use and possible cirrhosis. Alkaline phosphatase 323 AST 72 Total bilirubin 6.0 Repeat CMP in the morning Obtain ammonia level in the morning  Presumptive urinary tract infection with gross hematuria  Follow urine culture for ID and sensitivities Continue Rocephin started in the ED  Acute urinary retention status post Foley catheter placement on 05/01/2021 Foley catheter was placed in the ED on 05/01/2021 for acute urinary retention of 1 L. Urology, Dr. Venetia Constableeines, consulted via epic secure chat.  Non anion gap metabolic acidosis in the setting of acute renal failure Presented with serum bicarb 20, anion gap 9 IV albumin 12.5 g every 6 hours x3 doses. Continue to closely monitor  Severe bilateral lower extremity edema suspect contributed by severe hypoalbuminemia Serum albumin level less than 1.5 BNP and bilateral lower extremity duplex ultrasound ordered to rule out other causes, pending.  Mild enlargement of the cardiopericardial silhouette Prolonged QTC 530 Obtain 2D echo, follow Optimize magnesium and potassium levels Avoid QTC prolonging agents Repeat twelve-lead EKG in the morning  Severe protein calorie malnutrition BMI 22 Severe muscle mass loss Albumin level less than 1.5. Dietitian consult Encourage oral intake.  Generalized weakness/physical debility PT OT assessment Fall precautions  Critical care time: 65 minutes     DVT prophylaxis: Subcu Lovenox daily  Code Status: Full code  Family Communication: Updated her grandson Hannah Blake,  via phone, all questions answered to the best of my ability.  Disposition Plan:  Accepted by Dr Maryfrances Bunnell, [facility], to MedSurg unit  Consults called: GI consulted by EDP.  Urology consulted via secure chat.  Admission status: Inpatient status.  Patient will require least 2 midnights for further evaluation and treatment of present condition.     Status is: Inpatient       Darlin Drop MD [facility] Pager [phone]  If 7PM-7AM, please contact night-coverage [email or web address] Password [email or web address]  05/20/2021, 10:30 PM

## 2021-05-21 ENCOUNTER — Inpatient Hospital Stay (HOSPITAL_COMMUNITY): Payer: Medicare Other

## 2021-05-21 DIAGNOSIS — N390 Urinary tract infection, site not specified: Secondary | ICD-10-CM

## 2021-05-21 DIAGNOSIS — T83511A Infection and inflammatory reaction due to indwelling urethral catheter, initial encounter: Secondary | ICD-10-CM

## 2021-05-21 DIAGNOSIS — E43 Unspecified severe protein-calorie malnutrition: Secondary | ICD-10-CM

## 2021-05-21 DIAGNOSIS — R9431 Abnormal electrocardiogram [ECG] [EKG]: Secondary | ICD-10-CM

## 2021-05-21 DIAGNOSIS — E46 Unspecified protein-calorie malnutrition: Secondary | ICD-10-CM | POA: Insufficient documentation

## 2021-05-21 DIAGNOSIS — R188 Other ascites: Secondary | ICD-10-CM

## 2021-05-21 DIAGNOSIS — K7460 Unspecified cirrhosis of liver: Secondary | ICD-10-CM

## 2021-05-21 DIAGNOSIS — R609 Edema, unspecified: Secondary | ICD-10-CM

## 2021-05-21 DIAGNOSIS — R338 Other retention of urine: Secondary | ICD-10-CM

## 2021-05-21 HISTORY — DX: Unspecified severe protein-calorie malnutrition: E43

## 2021-05-21 LAB — ALBUMIN, PLEURAL OR PERITONEAL FLUID: Albumin, Fluid: <1.5 g/dL

## 2021-05-21 LAB — MAGNESIUM: Magnesium: 2.2 mg/dL (ref 1.7–2.4)

## 2021-05-21 LAB — PHOSPHORUS: Phosphorus: 3.8 mg/dL (ref 2.5–4.6)

## 2021-05-21 LAB — COMPREHENSIVE METABOLIC PANEL
ALT: 21 U/L (ref 0–44)
AST: 54 U/L — ABNORMAL HIGH (ref 15–41)
Albumin: <1.5 g/dL — ABNORMAL LOW (ref 3.5–5.0)
Alkaline Phosphatase: 267 U/L — ABNORMAL HIGH (ref 38–126)
Anion gap: 6 (ref 5–15)
BUN: 29 mg/dL — ABNORMAL HIGH (ref 8–23)
CO2: 21 mmol/L — ABNORMAL LOW (ref 22–32)
Calcium: 7.6 mg/dL — ABNORMAL LOW (ref 8.9–10.3)
Chloride: 110 mmol/L (ref 98–111)
Creatinine, Ser: 1.17 mg/dL — ABNORMAL HIGH (ref 0.44–1.00)
GFR, Estimated: 50 mL/min — ABNORMAL LOW (ref >60)
Glucose, Bld: 104 mg/dL — ABNORMAL HIGH (ref 70–99)
Potassium: 3.9 mmol/L (ref 3.5–5.1)
Sodium: 137 mmol/L (ref 135–145)
Total Bilirubin: 5.5 mg/dL — ABNORMAL HIGH (ref 0.3–1.2)
Total Protein: 6.6 g/dL (ref 6.5–8.1)

## 2021-05-21 LAB — CBC
HCT: 24.4 % — ABNORMAL LOW (ref 36.0–46.0)
Hemoglobin: 7.8 g/dL — ABNORMAL LOW (ref 12.0–15.0)
MCH: 31.3 pg (ref 26.0–34.0)
MCHC: 32 g/dL (ref 30.0–36.0)
MCV: 98 fL (ref 80.0–100.0)
Platelets: 148 10*3/uL — ABNORMAL LOW (ref 150–400)
RBC: 2.49 MIL/uL — ABNORMAL LOW (ref 3.87–5.11)
RDW: 18.6 % — ABNORMAL HIGH (ref 11.5–15.5)
WBC: 13.5 10*3/uL — ABNORMAL HIGH (ref 4.0–10.5)
nRBC: 0 % (ref 0.0–0.2)

## 2021-05-21 LAB — ECHOCARDIOGRAM COMPLETE
Height: 63 in
P 1/2 time: 453 msec
S' Lateral: 2.9 cm
Weight: 2000.01 oz

## 2021-05-21 LAB — AMMONIA: Ammonia: 38 umol/L — ABNORMAL HIGH (ref 9–35)

## 2021-05-21 LAB — LACTATE DEHYDROGENASE, PLEURAL OR PERITONEAL FLUID: LD, Fluid: 52 U/L — ABNORMAL HIGH (ref 3–23)

## 2021-05-21 LAB — PROTEIN, PLEURAL OR PERITONEAL FLUID: Total protein, fluid: <3 g/dL

## 2021-05-21 LAB — GLUCOSE, PLEURAL OR PERITONEAL FLUID: Glucose, Fluid: 95 mg/dL

## 2021-05-21 MED ORDER — CEFTRIAXONE SODIUM 1 G IJ SOLR
1.0000 g | INTRAMUSCULAR | Status: AC
  Administered 2021-05-21 – 2021-05-22 (×2): 1 g via INTRAVENOUS
  Filled 2021-05-21 (×2): qty 10

## 2021-05-21 MED ORDER — ALUM & MAG HYDROXIDE-SIMETH 200-200-20 MG/5ML PO SUSP
30.0000 mL | Freq: Four times a day (QID) | ORAL | Status: DC | PRN
  Administered 2021-05-21 – 2021-05-23 (×3): 30 mL via ORAL
  Filled 2021-05-21 (×3): qty 30

## 2021-05-21 MED ORDER — CHLORHEXIDINE GLUCONATE CLOTH 2 % EX PADS
6.0000 | MEDICATED_PAD | Freq: Every day | CUTANEOUS | Status: DC
  Administered 2021-05-21 – 2021-05-23 (×3): 6 via TOPICAL

## 2021-05-21 MED ORDER — PROSOURCE PLUS PO LIQD
30.0000 mL | Freq: Two times a day (BID) | ORAL | Status: DC
  Administered 2021-05-21 – 2021-05-23 (×4): 30 mL via ORAL
  Filled 2021-05-21 (×4): qty 30

## 2021-05-21 MED ORDER — FUROSEMIDE 10 MG/ML IJ SOLN
20.0000 mg | Freq: Every day | INTRAMUSCULAR | Status: DC
  Administered 2021-05-21 – 2021-05-22 (×2): 20 mg via INTRAVENOUS
  Filled 2021-05-21 (×2): qty 2

## 2021-05-21 MED ORDER — ENOXAPARIN SODIUM 40 MG/0.4ML IJ SOSY
40.0000 mg | PREFILLED_SYRINGE | INTRAMUSCULAR | Status: DC
  Filled 2021-05-21: qty 0.4

## 2021-05-21 MED ORDER — ADULT MULTIVITAMIN W/MINERALS CH
1.0000 | ORAL_TABLET | Freq: Every day | ORAL | Status: DC
  Administered 2021-05-21 – 2021-05-23 (×3): 1 via ORAL
  Filled 2021-05-21 (×3): qty 1

## 2021-05-21 NOTE — Discharge Instructions (Signed)
Tips For Adding Protein Nutrition Therapy  Patients may be advised to increase the protein in their diet but not necessarily the calories as well. However, note that when adding protein to your diet, you will also be adding extra calories. The following suggestions may help add the extra protein while keeping the calories as low as possible.  Tips Add extra egg to one or more meals  Increase the portion of milk to drink and change to skim milk if able  Include [place] yogurt or cottage cheese for snack or part of a meal  Increase portion size of protein entre and decrease portion of starch/bread  Mix protein powder, nut butter, almond/nut milk, non-fat dry milk, or Greek yogurt to shakes and smoothies  Use these ingredients also in baked goods or other recipes Use double the amount of sandwich filling  Add protein foods to all snacks including cheese, nut butters, milk and yogurt Food Tips for Including Protein  Beans Cook and use dried peas, beans, and tofu in soups or add to casseroles, pastas, and grain dishes that also contain cheese or meat  Mash with cheese and milk  Use tofu to make smoothies  Commercial Protein Supplements Use nutritional supplements or protein powder sold at pharmacies and grocery stores  Use protein powder in milk drinks and desserts, such as pudding  Mix with ice cream, milk, and fruit or other flavorings for a high-protein milkshake  Cottage Cheese or [employer] Mix with or use to stuff fruits and vegetables  Add to casseroles, spaghetti, noodles, or egg dishes such as omelets, scrambled eggs, and souffls  Use gelatin, pudding-type desserts, cheesecake, and pancake or waffle batter  Use to stuff crepes, pasta shells, or manicotti  Puree and use as a substitute for sour cream  Eggs, Egg whites, and Egg Yolks Add chopped, hard-cooked eggs to salads and dressings, vegetables, casseroles, and creamed meats  Beat eggs into mashed potatoes, vegetable purees, and  sauces  Add extra egg whites to quiches, scrambled eggs, custards, puddings, pancake batter, or [place] toast wash/batter  Make a rich custard with egg yolks, double strength milk, and sugar  Add extra hard-cooked yolks to deviled egg filling and sandwich spreads  Hard or Semi-Soft Cheese (Cheddar, Barnabas Lister, [place]) Melt on sandwiches, bread, muffins, tortillas, hamburgers, hot dogs, other meats or fish, vegetables, eggs, or desserts such as stewed figs or pies  Grate and add to soups, sauces, casseroles, vegetable dishes, potatoes, rice noodles, or meatloaf  Serve as a snack with crackers or bagels  Ice cream, Yogurt, and Frozen Yogurt Add to milk drinks such as milkshakes  Add to cereals, fruits, gelatin desserts, and pies  Blend or whip with soft or cooked fruits  Sandwich ice cream or frozen yogurt between enriched cake slices, cookies, or graham crackers  Use seasoned yogurt as a dip for fruits, vegetables, or chips  Use yogurt in place of sour cream in casseroles  Meat and Fish Add chopped, cooked meat or fish to vegetables, salads, casseroles, soups, sauces, and biscuit dough  Use in omelets, souffls, quiches, and sandwich fillings  Add chicken and [place] to stuffing  Wrap in pie crust or biscuit dough as turnovers  Add to stuffed baked potatoes  Add pureed meat to soups  Milk Use in beverages and in cooking  Use in preparing foods, such as hot cereal, soups, cocoa, or pudding  Add cream sauces to vegetable and other dishes  Use evaporated milk, evaporated skim milk, or sweetened condensed  milk instead of milk or water in recipes.  Nonfat Dry Milk Add 1/3 cup of nonfat dry milk powdered milk to each cup of regular milk for "double strength" milk  Add to yogurt and milk drinks, such as pasteurized eggnog and milkshakes  Add to scrambled eggs and mashed potatoes  Use in casseroles, meatloaf, hot cereal, breads, muffins, sauces, cream soups, puddings and custards, and other milk-based  desserts  Nuts, Seeds, and Wheat Germ Add to casseroles, breads, muffins, pancakes, cookies, and waffles  Sprinkle on fruit, cereal, ice cream, yogurt, vegetables, salads, and toast as a crunchy topping  Use in place of breadcrumbs  Blend with parsley or spinach, herbs, and cream for a noodle, pasta, or vegetable sauce.  Roll banana in chopped nuts  Peanut Butter Spread on sandwiches, toast, muffins, crackers, waffles, pancakes, and fruit slices  Use as a dip for raw vegetables, such as carrots, cauliflower, and celery  Blend with milk drinks, smoothies, and other beverages  Swirl through soft ice cream or yogurt  Spread on a banana then roll in crushed, dry cereal or chopped nuts   Copyright 2020  Academy of Nutrition and Dietetics. All rights reserved p

## 2021-05-21 NOTE — Evaluation (Signed)
Occupational Therapy Evaluation Patient Details Name: Hannah Blake MRN: [id number] DOB: 07-15-1951 Today's Date: 05/21/2021   History of Present Illness Hannah Blake is a 70 y.o. female with medical history significant for Bell's palsy diagnosed at [facility] ED on 03/17/2021, who presented to [facility] ED with complaints of gross hematuria via Foley catheter and worsening abdominal distention and bilateral lower extremity edema. Upon presentation to the ED, she had a paracentesis with 2 L of fluid removed.   Clinical Impression   Patient evaluated by Occupational Therapy with no further acute OT needs identified. All education has been completed and the patient has no further questions.  See below for any follow-up Occupational Therapy or equipment needs. OT is signing off. Thank you for this referral.       Recommendations for follow up therapy are one component of a multi-disciplinary discharge planning process, led by the attending physician.  Recommendations may be updated based on patient status, additional functional criteria and insurance authorization.   Follow Up Recommendations  No OT follow up    Assistance Recommended at Discharge    Functional Status Assessment  Patient has had a recent decline in their functional status and demonstrates the ability to make significant improvements in function in a reasonable and predictable amount of time.  Equipment Recommendations  Tub/shower seat (Will defer to PT assessment for mobility aid.)    Recommendations for Other Services PT consult     Precautions / Restrictions Precautions Precautions: Fall Restrictions Weight Bearing Restrictions: No      Mobility Bed Mobility               General bed mobility comments: Pt sitting EOB    Transfers Overall transfer level: Needs assistance   Transfers: Sit to/from Stand Sit to Stand: Supervision           General transfer comment: No hands-on assistance, but pt furniture  cruises in room. Pt ambulated in room with Min Guard assist without RW, and with supervision with RW.  Grandson asking of recommendation of cane vs RW and was asked to wait for physical therapy evaluation for final recommendations.      Balance Overall balance assessment: History of Falls;Needs assistance Sitting-balance support: No upper extremity supported Sitting balance-Leahy Scale: Good       Standing balance-Leahy Scale: Fair                             ADL either performed or assessed with clinical judgement   ADL Overall ADL's : At baseline                                       General ADL Comments: Pt is impeeded by abdominal and Bil ankle swelling but otherwise performing ADLs at baseline per grandson.     Vision Baseline Vision/History: 1 Wears glasses;4 Cataracts Ability to See in Adequate Light: 3 Highly impaired Patient Visual Report: No change from baseline Additional Comments: Pt endorses LT eye almost total blindness and RT eye sginificantly impaired.     Perception Perception Perception: Within Functional Limits   Praxis Praxis Praxis: Intact    Pertinent Vitals/Pain Pain Assessment: No/denies pain     Hand Dominance Right   Extremity/Trunk Assessment Upper Extremity Assessment Upper Extremity Assessment: Overall WFL for tasks assessed   Lower Extremity Assessment Lower Extremity Assessment: Defer  to PT evaluation   Cervical / Trunk Assessment Cervical / Trunk Assessment: Normal   Communication Communication Communication: Prefers language other than English   Cognition Arousal/Alertness: Awake/alert Behavior During Therapy: WFL for tasks assessed/performed Overall Cognitive Status: Within Functional Limits for tasks assessed                                       General Comments       Exercises Other Exercises Other Exercises: Pt/grandson educated on simple safety adjustments to home to  compensate for low vision and reduce risk of falls. Grandson receptive and verbalized undertsdanding to all education.  Pt educated on elevation of BLEs ad lib.   Shoulder Instructions      Home Living Family/patient expects to be discharged to:: Private residence Living Arrangements: Children;Other relatives;Other (Comment) (Dtr and 2 grandsons aged 32 and 79) Available Help at Discharge: Available 24 hours/day;Available PRN/intermittently Type of Home: Apartment Home Access: Stairs to enter [employer] of Steps: Full flight to 2nd floor apartment. No elevator. Pt reports that she rarely leaves tha apartment. Entrance Stairs-Rails: Right Home Layout: One level     Bathroom Shower/Tub: Chief Strategy Officer: Standard     Home Equipment: None          Prior Functioning/Environment Prior Level of Function : History of Falls (last six months)             Mobility Comments: Adult Grandson reports that pt has fallen regularly since he was young stating that pt can be "clumsy".  Pt also with significantly impaired vision which may contribute.  Pt furniture cruises at baseline.  Pt does not like beds and sleeps on the floor. Pt has been able to stand from floor independently until about 1-2 months ago with BEll's Plasy onset. Since that time, pt crawls to couch to pull up on for assistance. ADLs Comments: Family assists with cooking/cleaning/laundry as needed and with transportation as pt does not drive. Pt performs her own BADLs with family supervision in home.        OT Problem List: Impaired vision/perception;Impaired balance (sitting and/or standing);Increased edema      OT Treatment/Interventions:      OT Goals(Current goals can be found in the care plan section) Acute Rehab OT Goals Patient Stated Goal: Pt's grandson stated that other than PT recommendations for AD, no concerns or needs related to OT OT Goal Formulation: All assessment and education  complete, DC therapy  OT Frequency:     Barriers to D/C:            Co-evaluation              AM-PAC OT "6 Clicks" Daily Activity     Outcome Measure Help from another person eating meals?: None Help from another person taking care of personal grooming?: A Little Help from another person toileting, which includes using toliet, bedpan, or urinal?: A Little Help from another person bathing (including washing, rinsing, drying)?: A Little Help from another person to put on and taking off regular upper body clothing?: A Little Help from another person to put on and taking off regular lower body clothing?: A Little 6 Click Score: 19   End of Session Equipment Utilized During Treatment: Rolling walker (2 wheels) Nurse Communication: Mobility status  Activity Tolerance: Patient tolerated treatment well Patient left: in chair;with nursing/sitter in room;with family/visitor present;with  chair alarm set;with call bell/phone within reach  OT Visit Diagnosis: History of falling (Z91.81);Repeated falls (R29.6);Low vision, both eyes (H54.2)                Time: [id number] OT Time Calculation (min): 26 min Charges:  OT General Charges $OT Visit: 1 Visit OT Evaluation $OT Eval Low Complexity: 1 Low OT Treatments $Self Care/Home Management : 8-22 mins  Anderson Malta, OT Acute Rehab Services Office: [phone] 05/21/2021  Julien Girt 05/21/2021, 10:09 AM

## 2021-05-21 NOTE — Consult Note (Signed)
Urology Consult   Physician requesting consult: Irene Pap MD  Reason for consult: Gross hematuria   History of Present Illness: Hannah Blake is a 70 y.o. with Bells palsy and recent history of urinary retention and massive abdominal ascites. Patient intially presented to the ED on 10/12 due to abdominal swelling and inability to urinate. On catheter placement there was 300cc urine out, bladder scan remained elevated so she underwent CT imaging which showed massive ascites. She remained with catheter since that time and has yet to follow up with urology. Patient represented to the ED due to abdominal swelling and gross hematuria from her catheter x 2 days. She underwent paracentesis in the ED with removal of 2L fluid. Prior to this her catheter was draining clear yellow. No fevers or chills at home. No nausea or vomitinng.   Catheter draining clear yellow on recheck after catheter exchange this morning.   Past Medical History:  Diagnosis Date   Bell's palsy     Past Surgical History:  Procedure Laterality Date   CLAVICLE SURGERY     FRACTURE SURGERY       Current Hospital Medications:  Home meds:  No current facility-administered medications on file prior to encounter.   No current outpatient medications on file prior to encounter.     Scheduled Meds:  enoxaparin (LOVENOX) injection  30 mg Subcutaneous Q24H   feeding supplement  237 mL Oral BID BM   Continuous Infusions:  albumin human 12.5 g (05/21/21 [id number])   cefTRIAXone (ROCEPHIN)  IV     PRN Meds:.alum & mag hydroxide-simeth, melatonin, polyethylene glycol, prochlorperazine  Allergies: No Known Allergies  No family history on file.  Social History:  reports that she has been smoking cigarettes. She has never used smokeless tobacco. She reports that she does not currently use alcohol. She reports that she does not use drugs.  ROS: A complete review of systems was performed.  All systems are negative except for  pertinent findings as noted.  Physical Exam:  Vital signs in last 24 hours: Temp:  [97.5 F (36.4 C)-98.6 F (37 C)] 98.1 F (36.7 C) (11/01 0329) Pulse Rate:  [83-102] 90 (11/01 0329) Resp:  [11-21] 18 (11/01 0329) BP: (92-117)/(44-84) 105/65 (11/01 0329) SpO2:  [95 %-100 %] 99 % (11/01 0329) Weight:  [56.7 kg-56.9 kg] 56.7 kg (11/01 0236) Constitutional:  Alert and oriented, No acute distress Cardiovascular: Regular rate and rhythm, No JVD Respiratory: Normal respiratory effort, Lungs clear bilaterally GI: Abdomen is soft, nontender, moderately distended GU: No CVA tenderness, 14Fr foley in place with pink tinged urine. Lymphatic: No lymphadenopathy Neurologic: Grossly intact, no focal deficits Psychiatric: Normal mood and affect  Laboratory Data:  Recent Labs    05/20/21 1201 05/20/21 2220 05/21/21 0447  WBC 13.6* 13.6* 13.5*  HGB 9.0* 8.6* 7.8*  HCT 27.8* 27.0* 24.4*  PLT 184 158 148*    Recent Labs    05/20/21 1201 05/20/21 2220 05/21/21 0447  NA 135  --  137  K 3.9  --  3.9  CL 106  --  110  GLUCOSE 108*  --  104*  BUN 28*  --  29*  CALCIUM 7.7*  --  7.6*  CREATININE 1.80* 1.41* 1.17*     Results for orders placed or performed during the hospital encounter of 05/20/21 (from the past 24 hour(s))  Urinalysis, Routine w reflex microscopic     Status: Abnormal   Collection Time: 05/20/21 12:01 PM  Result Value Ref Range  Color, Urine RED (A) YELLOW   APPearance TURBID (A) CLEAR   Specific Gravity, Urine  1.005 - 1.030    TEST NOT REPORTED DUE TO COLOR INTERFERENCE OF URINE PIGMENT   pH  5.0 - 8.0    TEST NOT REPORTED DUE TO COLOR INTERFERENCE OF URINE PIGMENT   Glucose, UA (A) NEGATIVE mg/dL    TEST NOT REPORTED DUE TO COLOR INTERFERENCE OF URINE PIGMENT   Hgb urine dipstick (A) NEGATIVE    TEST NOT REPORTED DUE TO COLOR INTERFERENCE OF URINE PIGMENT   Bilirubin Urine (A) NEGATIVE    TEST NOT REPORTED DUE TO COLOR INTERFERENCE OF URINE PIGMENT    Ketones, ur (A) NEGATIVE mg/dL    TEST NOT REPORTED DUE TO COLOR INTERFERENCE OF URINE PIGMENT   Protein, ur (A) NEGATIVE mg/dL    TEST NOT REPORTED DUE TO COLOR INTERFERENCE OF URINE PIGMENT   Nitrite (A) NEGATIVE    TEST NOT REPORTED DUE TO COLOR INTERFERENCE OF URINE PIGMENT   Leukocytes,Ua (A) NEGATIVE    TEST NOT REPORTED DUE TO COLOR INTERFERENCE OF URINE PIGMENT  CBC with Differential     Status: Abnormal   Collection Time: 05/20/21 12:01 PM  Result Value Ref Range   WBC 13.6 (H) 4.0 - 10.5 K/uL   RBC 2.90 (L) 3.87 - 5.11 MIL/uL   Hemoglobin 9.0 (L) 12.0 - 15.0 g/dL   HCT 27.8 (L) 36.0 - 46.0 %   MCV 95.9 80.0 - 100.0 fL   MCH 31.0 26.0 - 34.0 pg   MCHC 32.4 30.0 - 36.0 g/dL   RDW 18.5 (H) 11.5 - 15.5 %   Platelets 184 150 - 400 K/uL   nRBC 0.0 0.0 - 0.2 %   Neutrophils Relative % 77 %   Neutro Abs 10.5 (H) 1.7 - 7.7 K/uL   Lymphocytes Relative 17 %   Lymphs Abs 2.3 0.7 - 4.0 K/uL   Monocytes Relative 4 %   Monocytes Absolute 0.6 0.1 - 1.0 K/uL   Eosinophils Relative 1 %   Eosinophils Absolute 0.1 0.0 - 0.5 K/uL   Basophils Relative 0 %   Basophils Absolute 0.1 0.0 - 0.1 K/uL   Immature Granulocytes 1 %   Abs Immature Granulocytes 0.09 (H) 0.00 - 0.07 K/uL  Comprehensive metabolic panel     Status: Abnormal   Collection Time: 05/20/21 12:01 PM  Result Value Ref Range   Sodium 135 135 - 145 mmol/L   Potassium 3.9 3.5 - 5.1 mmol/L   Chloride 106 98 - 111 mmol/L   CO2 20 (L) 22 - 32 mmol/L   Glucose, Bld 108 (H) 70 - 99 mg/dL   BUN 28 (H) 8 - 23 mg/dL   Creatinine, Ser 1.80 (H) 0.44 - 1.00 mg/dL   Calcium 7.7 (L) 8.9 - 10.3 mg/dL   Total Protein 7.7 6.5 - 8.1 g/dL   Albumin <1.5 (L) 3.5 - 5.0 g/dL   AST 72 (H) 15 - 41 U/L   ALT 24 0 - 44 U/L   Alkaline Phosphatase 323 (H) 38 - 126 U/L   Total Bilirubin 6.0 (H) 0.3 - 1.2 mg/dL   GFR, Estimated 30 (L) >60 mL/min   Anion gap 9 5 - 15  Lipase, blood     Status: None   Collection Time: 05/20/21 12:01 PM  Result  Value Ref Range   Lipase 51 11 - 51 U/L  Protime-INR     Status: Abnormal   Collection Time: 05/20/21 12:01 PM  Result Value Ref Range   Prothrombin Time 16.9 (H) 11.4 - 15.2 seconds   INR 1.4 (H) 0.8 - 1.2  Urinalysis, Microscopic (reflex)     Status: Abnormal   Collection Time: 05/20/21 12:01 PM  Result Value Ref Range   RBC / HPF >50 0 - 5 RBC/hpf   WBC, UA 21-50 0 - 5 WBC/hpf   Bacteria, UA MANY (A) NONE SEEN   Squamous Epithelial / LPF 0-5 0 - 5  Lactate dehydrogenase (pleural or peritoneal fluid)     Status: Abnormal   Collection Time: 05/20/21  2:54 PM  Result Value Ref Range   LD, Fluid 52 (H) 3 - 23 U/L   Fluid Type-FLDH PERITONEAL CAVITY   Glucose, pleural or peritoneal fluid     Status: None   Collection Time: 05/20/21  2:54 PM  Result Value Ref Range   Glucose, Fluid 95 mg/dL   Fluid Type-FGLU PERITONEAL CAVITY   Protein, pleural or peritoneal fluid     Status: None   Collection Time: 05/20/21  2:54 PM  Result Value Ref Range   Total protein, fluid <3.0 g/dL   Fluid Type-FTP PERITONEAL CAVITY   Albumin, pleural or peritoneal fluid      Status: None   Collection Time: 05/20/21  2:54 PM  Result Value Ref Range   Albumin, Fluid <1.5 g/dL   Fluid Type-FALB PERITONEAL CAVITY   Body fluid cell count with differential     Status: Abnormal   Collection Time: 05/20/21  2:54 PM  Result Value Ref Range   Fluid Type-FCT PERITONEAL CAVITY    Color, Fluid YELLOW    Appearance, Fluid HAZY (A) CLEAR   Total Nucleated Cell Count, Fluid 86 0 - 1,000 cu mm   Neutrophil Count, Fluid 17 0 - 25 %   Lymphs, Fluid 51 %   Monocyte-Macrophage-Serous Fluid 32 (L) 50 - 90 %   Eos, Fluid 0 %  Resp Panel by RT-PCR (Flu A&B, Covid) Nasopharyngeal Swab     Status: None   Collection Time: 05/20/21  3:13 PM   Specimen: Nasopharyngeal Swab; Nasopharyngeal(NP) swabs in vial transport medium  Result Value Ref Range   SARS Coronavirus 2 by RT PCR NEGATIVE NEGATIVE   Influenza A by PCR  NEGATIVE NEGATIVE   Influenza B by PCR NEGATIVE NEGATIVE  Blood culture (routine x 2)     Status: None (Preliminary result)   Collection Time: 05/20/21  4:12 PM   Specimen: Left Antecubital; Blood  Result Value Ref Range   Specimen Description      LEFT ANTECUBITAL Performed at [facility], [address] [place], [place] [address]    Special Requests      BOTTLES DRAWN AEROBIC AND ANAEROBIC Blood Culture adequate volume Performed at [facility], [address] [place], [place] [address]    Culture      NO GROWTH < 12 HOURS Performed at [facility], [address], [place], [place] [address]    Report Status PENDING   Lactic acid, plasma     Status: None   Collection Time: 05/20/21  4:39 PM  Result Value Ref Range   Lactic Acid, Venous 1.5 0.5 - 1.9 mmol/L  Lactic acid, plasma     Status: None   Collection Time: 05/20/21 10:10 PM  Result Value Ref Range   Lactic Acid, Venous 1.4 0.5 - 1.9 mmol/L  CBC     Status: Abnormal   Collection Time:  05/20/21 10:20 PM  Result Value Ref Range   WBC 13.6 (H) 4.0 - 10.5 K/uL   RBC 2.73 (L) 3.87 - 5.11 MIL/uL   Hemoglobin 8.6 (L) 12.0 - 15.0 g/dL   HCT 09.3 (L) 81.8 - 29.9 %   MCV 98.9 80.0 - 100.0 fL   MCH 31.5 26.0 - 34.0 pg   MCHC 31.9 30.0 - 36.0 g/dL   RDW 37.1 (H) 69.6 - 78.9 %   Platelets 158 150 - 400 K/uL   nRBC 0.0 0.0 - 0.2 %  Creatinine, serum     Status: Abnormal   Collection Time: 05/20/21 10:20 PM  Result Value Ref Range   Creatinine, Ser 1.41 (H) 0.44 - 1.00 mg/dL   GFR, Estimated 40 (L) >60 mL/min  Brain natriuretic peptide     Status: Abnormal   Collection Time: 05/20/21 10:20 PM  Result Value Ref Range   B Natriuretic Peptide 689.8 (H) 0.0 - 100.0 pg/mL  Magnesium     Status: None   Collection Time: 05/20/21 10:20 PM  Result Value Ref Range   Magnesium 2.2 1.7 - 2.4 mg/dL  Phosphorus     Status: None   Collection Time: 05/20/21 10:20 PM  Result Value Ref Range   Phosphorus  4.2 2.5 - 4.6 mg/dL  Ammonia     Status: Abnormal   Collection Time: 05/21/21  4:47 AM  Result Value Ref Range   Ammonia 38 (H) 9 - 35 umol/L  CBC     Status: Abnormal   Collection Time: 05/21/21  4:47 AM  Result Value Ref Range   WBC 13.5 (H) 4.0 - 10.5 K/uL   RBC 2.49 (L) 3.87 - 5.11 MIL/uL   Hemoglobin 7.8 (L) 12.0 - 15.0 g/dL   HCT 38.1 (L) 01.7 - 51.0 %   MCV 98.0 80.0 - 100.0 fL   MCH 31.3 26.0 - 34.0 pg   MCHC 32.0 30.0 - 36.0 g/dL   RDW 25.8 (H) 52.7 - 78.2 %   Platelets 148 (L) 150 - 400 K/uL   nRBC 0.0 0.0 - 0.2 %  Comprehensive metabolic panel     Status: Abnormal   Collection Time: 05/21/21  4:47 AM  Result Value Ref Range   Sodium 137 135 - 145 mmol/L   Potassium 3.9 3.5 - 5.1 mmol/L   Chloride 110 98 - 111 mmol/L   CO2 21 (L) 22 - 32 mmol/L   Glucose, Bld 104 (H) 70 - 99 mg/dL   BUN 29 (H) 8 - 23 mg/dL   Creatinine, Ser 4.23 (H) 0.44 - 1.00 mg/dL   Calcium 7.6 (L) 8.9 - 10.3 mg/dL   Total Protein 6.6 6.5 - 8.1 g/dL   Albumin <5.3 (L) 3.5 - 5.0 g/dL   AST 54 (H) 15 - 41 U/L   ALT 21 0 - 44 U/L   Alkaline Phosphatase 267 (H) 38 - 126 U/L   Total Bilirubin 5.5 (H) 0.3 - 1.2 mg/dL   GFR, Estimated 50 (L) >60 mL/min   Anion gap 6 5 - 15  Magnesium     Status: None   Collection Time: 05/21/21  4:47 AM  Result Value Ref Range   Magnesium 2.2 1.7 - 2.4 mg/dL  Phosphorus     Status: None   Collection Time: 05/21/21  4:47 AM  Result Value Ref Range   Phosphorus 3.8 2.5 - 4.6 mg/dL   Recent Results (from the past 240 hour(s))  Resp Panel by RT-PCR (Flu A&B, Covid) Nasopharyngeal  Swab     Status: None   Collection Time: 05/20/21  3:13 PM   Specimen: Nasopharyngeal Swab; Nasopharyngeal(NP) swabs in vial transport medium  Result Value Ref Range Status   SARS Coronavirus 2 by RT PCR NEGATIVE NEGATIVE Final    Comment: (NOTE) SARS-CoV-2 target nucleic acids are NOT DETECTED.  The SARS-CoV-2 RNA is generally detectable in upper respiratory specimens during the  acute phase of infection. The lowest concentration of SARS-CoV-2 viral copies this assay can detect is 138 copies/mL. A negative result does not preclude SARS-Cov-2 infection and should not be used as the sole basis for treatment or other patient management decisions. A negative result may occur with  improper specimen collection/handling, submission of specimen other than nasopharyngeal swab, presence of viral mutation(s) within the areas targeted by this assay, and inadequate number of viral copies(<138 copies/mL). A negative result must be combined with clinical observations, patient history, and epidemiological information. The expected result is Negative.  Fact Sheet for Patients:  EntrepreneurPulse.com.au  Fact Sheet for Healthcare Providers:  IncredibleEmployment.be  This test is no t yet approved or cleared by the [place] FDA and  has been authorized for detection and/or diagnosis of SARS-CoV-2 by FDA under an Emergency Use Authorization (EUA). This EUA will remain  in effect (meaning this test can be used) for the duration of the COVID-19 declaration under Section 564(b)(1) of the Act, 21 U.S.C.section 360bbb-3(b)(1), unless the authorization is terminated  or revoked sooner.       Influenza A by PCR NEGATIVE NEGATIVE Final   Influenza B by PCR NEGATIVE NEGATIVE Final    Comment: (NOTE) The Xpert Xpress SARS-CoV-2/FLU/RSV plus assay is intended as an aid in the diagnosis of influenza from Nasopharyngeal swab specimens and should not be used as a sole basis for treatment. Nasal washings and aspirates are unacceptable for Xpert Xpress SARS-CoV-2/FLU/RSV testing.  Fact Sheet for Patients: EntrepreneurPulse.com.au  Fact Sheet for Healthcare Providers: IncredibleEmployment.be  This test is not yet approved or cleared by the [place] FDA and has been authorized for detection and/or  diagnosis of SARS-CoV-2 by FDA under an Emergency Use Authorization (EUA). This EUA will remain in effect (meaning this test can be used) for the duration of the COVID-19 declaration under Section 564(b)(1) of the Act, 21 U.S.C. section 360bbb-3(b)(1), unless the authorization is terminated or revoked.  Performed at [facility], [place] [address] [place], [place] [address]   Blood culture (routine x 2)     Status: None (Preliminary result)   Collection Time: 05/20/21  4:12 PM   Specimen: Left Antecubital; Blood  Result Value Ref Range Status   Specimen Description   Final    LEFT ANTECUBITAL Performed at [facility], [address] [place], [place] [address]    Special Requests   Final    BOTTLES DRAWN AEROBIC AND ANAEROBIC Blood Culture adequate volume Performed at [facility], [address] [place], [place] [address]    Culture   Final    NO GROWTH < 12 HOURS Performed at [facility], [place] [address], [place], [place] [address]    Report Status PENDING  Incomplete    Renal Function: Recent Labs    05/20/21 1201 05/20/21 2220 05/21/21 0447  CREATININE 1.80* 1.41* 1.17*   Estimated Creatinine Clearance: 36.5 mL/min (A) (by C-G formula based on SCr of 1.17 mg/dL (H)).  Radiologic Imaging: DG Chest Port 1 View  Result Date: 05/20/2021 CLINICAL  DATA:  Acute kidney injury, hematuria, lower extremity swelling EXAM: PORTABLE CHEST 1 VIEW COMPARISON:  None. FINDINGS: Low lung volumes. Mild enlargement of the cardiopericardial silhouette. Atherosclerotic aortic arch. Otherwise normal mediastinal contour. No pneumothorax. No pleural effusion. Moderate eventration of the right hemidiaphragm. No overt pulmonary edema. Streaky bibasilar lung opacities compatible with atelectasis. IMPRESSION: 1. Low lung volumes with streaky bibasilar lung opacities compatible with atelectasis. Moderate eventration of the right hemidiaphragm. 2. Mild enlargement of  the cardiopericardial silhouette without overt pulmonary edema. Electronically Signed   By: Ilona Sorrel M.D.   On: 05/20/2021 14:39    I independently reviewed the above imaging studies.  Impression/Recommendation: 70 y/o female with possible acute urinary retention and new hematuria with indwelling foley in place.   Urinary retention: Unclear if patient truly ever in urinary retention as documentation from initial placement shows 300cc of urine output on placement. Bladder scan was >1L but this was likely due to her ascites.  -Reasonable to attempt voiding trial while inpatient at discretion of primary team.  -If fails inpatient TOV, she will need to discharge with foley until outpatient follow up with urology   Gross hematuria: Started 2 days ago while indwelling foley catheter in place. CT obtained during initial ED visit showed no GU pathology. Etiology most likely from UTI versus foley trauma.  -Please obtain bladder ultrasound to assess for clot/ other intraluminal abnormality  -Follow up urine culture, growing gram negative rods -Catheter now draining clear yellow urine after exchanging it this morning    Xia Stohr 05/21/2021, 7:36 AM

## 2021-05-21 NOTE — Plan of Care (Signed)
Received consult for gross hematuria.  Patient's catheter was draining thin merlot urine. I was not able to easily flush catheter. Given this, existing catheter was removed. There was a small clot at the tip. Otherwise, urine was thin. A new 14Fr foley was replaced. This catheter flushed very easily and there was no additional clot burden. After gentle irrigation the catheter was draining clear pink tinged urine. I elected not to place a hematuria catheter for now.   Plan: -Full consult to follow -Please obtain a bladder ultrasound now to assess for clot burden -Notify urology if catheter stops draining

## 2021-05-21 NOTE — Progress Notes (Signed)
Initial Nutrition Assessment  DOCUMENTATION CODES:   Severe malnutrition in context of chronic illness  INTERVENTION:   -Ensure Enlive po BID, each supplement provides 350 kcal and 20 grams of protein  -Prosource Plus PO BID, each provides 100 kcals and 15g protein  -Multivitamin with minerals daily  -"High Protein" handout in discharge instructions  NUTRITION DIAGNOSIS:   Severe Malnutrition related to chronic illness as evidenced by energy intake < or equal to 75% for > or equal to 1 month, severe fat depletion, severe muscle depletion.  GOAL:   Patient will meet greater than or equal to 90% of their needs  MONITOR:   PO intake, Supplement acceptance, Labs, Weight trends, I & O's  REASON FOR ASSESSMENT:   Consult Assessment of nutrition requirement/status  ASSESSMENT:   70 y.o. female with medical history significant for Bell's palsy diagnosed at [facility] health ED on 03/17/2021, who presented to [facility] ED with complaints of gross hematuria via Foley catheter and worsening abdominal distention and bilateral lower extremity edema.  Patient in room with grandson at bedside. Pt sitting in chair with legs elevated d/t BLE edema. Per grandson, her edema has improved some since her paracentesis yesterday. Pt now waiting to have ultrasound before eating her breakfast.  Pt has had decreased intakes since her diagnosis of Bell's Palsy which has impacted her ability to swallow and chew. Has had difficulty with constipation and urinating so has not been able to tolerate big portions of foods. Typically her diet consists of rice, vegetables and meats like pork and beef. Ensure has been ordered, will order Prosource as well for added protein with little volume.  Per weight records, no weight changes noted.  Medications reviewed.  Labs reviewed.  NUTRITION - FOCUSED PHYSICAL EXAM:  Flowsheet Row Most Recent Value  Orbital Region Moderate depletion  Upper Arm Region Severe depletion   Thoracic and Lumbar Region Unable to assess  [distention]  Buccal Region Severe depletion  [lt sided Bell's Palsy]  Temple Region Severe depletion  Clavicle Bone Region Severe depletion  Clavicle and Acromion Bone Region Severe depletion  Scapular Bone Region Severe depletion  Dorsal Hand Severe depletion  Patellar Region Unable to assess  [edema]  Anterior Thigh Region Unable to assess  Posterior Calf Region Unable to assess  [edema]  Edema (RD Assessment) Moderate  Hair Reviewed  Eyes Reviewed  Mouth Reviewed  Skin Reviewed  [dry, jaundice]  Nails Reviewed       Diet Order:   Diet Order             Diet regular Room service appropriate? Yes; Fluid consistency: Thin  Diet effective now                   EDUCATION NEEDS:   Education needs have been addressed  Skin:  Skin Assessment: Reviewed RN Assessment  Last BM:  11/1  Height:   Ht Readings from Last 1 Encounters:  05/20/21 5\' 3"  (1.6 m)    Weight:   Wt Readings from Last 1 Encounters:  05/21/21 56.7 kg    BMI:  Body mass index is 22.14 kg/m.  Estimated Nutritional Needs:   Kcal:  1600-1800  Protein:  80-90g  Fluid:  1.6L/day   13/01/22, MS, RD, LDN Inpatient Clinical Dietitian Contact information available via Amion

## 2021-05-21 NOTE — Progress Notes (Signed)
PROGRESS NOTE    Hannah Blake  [id number] DOB: 1950-10-16 DOA: 05/20/2021 PCP: Merryl Hacker, No   Brief Narrative: Hannah Blake is a 70 y.o. female with a history of Bell's Palsy. Patient presented secondary to gross hematuria in setting of foley catheter. Evidence of significant fluid overload in setting of cirrhosis.   Assessment & Plan:   Principal Problem:   AKI (acute kidney injury) ([place]) Active Problems:   Protein-calorie malnutrition, severe   Cirrhosis of liver with ascites (H[facility])   UTI (urinary tract infection) due to urinary indwelling Foley catheter ([place])   Acute urinary retention   AKI Thought secondary to intravascular volume depletion. Patient given IV fluids on admission. Creatinine improved. Overall though, patient is fluid overloaded. Appears to be resolved.  Cirrhosis with ascites Fluid overload Patient underwent paracentesis in the ED on 10/31. [facility] consulted on admission. Ascites not consistent with SBP. Cytology pending. -Lasix 20 mg IV daily -2 g sodium diet with fluid restriction  Possible CAUTI Foley catheter present on admission. Urine culture obtained on admission and is significant for GNR. Ceftriaxone IV initiated on admission -Continue Ceftriaxone IV -Follow-up urine culture  Gross hematuria Urology consulted on admission.   Acute urinary retension Patient presented with a foley catheter which was previously placed on 10/12 by Urology. Replaced 11/1.  Non-anion gap metabolic acidosis Mild. In setting of AKI.  Bilateral lower extremity edema Severe. Likely related to presumed cirrhosis. Albumin is also undetectable on metabolic panel. Patient given albumin 12.5 g q6 hours x3. -Diuretic as mentioned above  Possible cardiomegaly Transthoracic Echocardiogram ordered and is pending  Prolonged QTc QTc of 530 msec on admission.  Severe malnutrition Dietitian consulted. Recommendations pending.  Bells Palsy Left sided. Stable.   DVT  prophylaxis: Lovenox Code Status:   Code Status: Full Code Family Communication: Grandson at bedside Disposition Plan: Discharge home likely in several days pending improvement of edema, AKI, transition to oral antibiotics, specialist recommendations for discharge   Consultants:  Gastroenterology Urology  Procedures:  FOLEY (11/1 >>  Antimicrobials: Ceftriaxone IV    Subjective: Interpreter: Lucianne Lei [phone]  Patient reports some increased abdominal distension. No chest pain or dyspnea.   Objective: Vitals:   05/21/21 0028 05/21/21 0236 05/21/21 0329 05/21/21 0737  BP: 104/65  105/65 98/65  Pulse: 94  90 96  Resp: 18  18   Temp: 97.6 F (36.4 C)  98.1 F (36.7 C) 97.9 F (36.6 C)  TempSrc: Axillary  Axillary Oral  SpO2: 96%  99%   Weight:  56.7 kg    Height:        Intake/Output Summary (Last 24 hours) at 05/21/2021 0743 Last data filed at 05/21/2021 0330 Gross per 24 hour  Intake 600 ml  Output 2350 ml  Net -1750 ml   Filed Weights   05/20/21 1118 05/20/21 2129 05/21/21 0236  Weight: 56.9 kg 56.7 kg 56.7 kg    Examination:  General exam: Appears calm and comfortable Respiratory system: Clear to auscultation. Respiratory effort normal. Cardiovascular system: S1 & S2 heard, RRR. 1/6 systolic murmur Gastrointestinal system: Abdomen is distended, soft and nontender. No organomegaly or masses felt. Normal bowel sounds heard. Central nervous system: Alert and oriented. No focal neurological deficits. Musculoskeletal: 2+ pitting edema up to hips. No calf tenderness Skin: No cyanosis. No rashes Psychiatry: Judgement and insight appear normal. Mood & affect appropriate.     Data Reviewed: I have personally reviewed following labs and imaging studies  CBC Lab Results  Component  Value Date   WBC 13.5 (H) 05/21/2021   RBC 2.49 (L) 05/21/2021   HGB 7.8 (L) 05/21/2021   HCT 24.4 (L) 05/21/2021   MCV 98.0 05/21/2021   MCH 31.3 05/21/2021   PLT 148 (L) 05/21/2021    MCHC 32.0 05/21/2021   RDW 18.6 (H) 05/21/2021   LYMPHSABS 2.3 05/20/2021   MONOABS 0.6 05/20/2021   EOSABS 0.1 05/20/2021   BASOSABS 0.1 [id number]     Last metabolic panel Lab Results  Component Value Date   NA 137 05/21/2021   K 3.9 05/21/2021   CL 110 05/21/2021   CO2 21 (L) 05/21/2021   BUN 29 (H) 05/21/2021   CREATININE 1.17 (H) 05/21/2021   GLUCOSE 104 (H) 05/21/2021   GFRNONAA 50 (L) 05/21/2021   CALCIUM 7.6 (L) 05/21/2021   PHOS 3.8 05/21/2021   PROT 6.6 05/21/2021   ALBUMIN <1.5 (L) 05/21/2021   BILITOT 5.5 (H) 05/21/2021   ALKPHOS 267 (H) 05/21/2021   AST 54 (H) 05/21/2021   ALT 21 05/21/2021   ANIONGAP 6 05/21/2021    CBG (last 3)  No results for input(s): GLUCAP in the last 72 hours.   GFR: Estimated Creatinine Clearance: 36.5 mL/min (A) (by C-G formula based on SCr of 1.17 mg/dL (H)).  Coagulation Profile: Recent Labs  Lab 05/20/21 1201  INR 1.4*    Recent Results (from the past 240 hour(s))  Resp Panel by RT-PCR (Flu A&B, Covid) Nasopharyngeal Swab     Status: None   Collection Time: 05/20/21  3:13 PM   Specimen: Nasopharyngeal Swab; Nasopharyngeal(NP) swabs in vial transport medium  Result Value Ref Range Status   SARS Coronavirus 2 by RT PCR NEGATIVE NEGATIVE Final    Comment: (NOTE) SARS-CoV-2 target nucleic acids are NOT DETECTED.  The SARS-CoV-2 RNA is generally detectable in upper respiratory specimens during the acute phase of infection. The lowest concentration of SARS-CoV-2 viral copies this assay can detect is 138 copies/mL. A negative result does not preclude SARS-Cov-2 infection and should not be used as the sole basis for treatment or other patient management decisions. A negative result may occur with  improper specimen collection/handling, submission of specimen other than nasopharyngeal swab, presence of viral mutation(s) within the areas targeted by this assay, and inadequate number of viral copies(<138 copies/mL). A  negative result must be combined with clinical observations, patient history, and epidemiological information. The expected result is Negative.  Fact Sheet for Patients:  EntrepreneurPulse.com.au  Fact Sheet for Healthcare Providers:  IncredibleEmployment.be  This test is no t yet approved or cleared by the [place] FDA and  has been authorized for detection and/or diagnosis of SARS-CoV-2 by FDA under an Emergency Use Authorization (EUA). This EUA will remain  in effect (meaning this test can be used) for the duration of the COVID-19 declaration under Section 564(b)(1) of the Act, 21 U.S.C.section 360bbb-3(b)(1), unless the authorization is terminated  or revoked sooner.       Influenza A by PCR NEGATIVE NEGATIVE Final   Influenza B by PCR NEGATIVE NEGATIVE Final    Comment: (NOTE) The Xpert Xpress SARS-CoV-2/FLU/RSV plus assay is intended as an aid in the diagnosis of influenza from Nasopharyngeal swab specimens and should not be used as a sole basis for treatment. Nasal washings and aspirates are unacceptable for Xpert Xpress SARS-CoV-2/FLU/RSV testing.  Fact Sheet for Patients: EntrepreneurPulse.com.au  Fact Sheet for Healthcare Providers: IncredibleEmployment.be  This test is not yet approved or cleared by the [place] FDA and has been  authorized for detection and/or diagnosis of SARS-CoV-2 by FDA under an Emergency Use Authorization (EUA). This EUA will remain in effect (meaning this test can be used) for the duration of the COVID-19 declaration under Section 564(b)(1) of the Act, 21 U.S.C. section 360bbb-3(b)(1), unless the authorization is terminated or revoked.  Performed at [facility], [address], [place], [place] [address]   Blood culture (routine x 2)     Status: None (Preliminary result)   Collection Time: 05/20/21  4:12 PM   Specimen: Left Antecubital; Blood   Result Value Ref Range Status   Specimen Description   Final    LEFT ANTECUBITAL Performed at [facility], [address] [place], [place] [address]    Special Requests   Final    BOTTLES DRAWN AEROBIC AND ANAEROBIC Blood Culture adequate volume Performed at [facility], [address] [place], [place] [address]    Culture   Final    NO GROWTH < 12 HOURS Performed at [facility], [address] [place], [place] [address]    Report Status PENDING  Incomplete        Radiology Studies: DG Chest Port 1 View  Result Date: 05/20/2021 CLINICAL DATA:  Acute kidney injury, hematuria, lower extremity swelling EXAM: PORTABLE CHEST 1 VIEW COMPARISON:  None. FINDINGS: Low lung volumes. Mild enlargement of the cardiopericardial silhouette. Atherosclerotic aortic arch. Otherwise normal mediastinal contour. No pneumothorax. No pleural effusion. Moderate eventration of the right hemidiaphragm. No overt pulmonary edema. Streaky bibasilar lung opacities compatible with atelectasis. IMPRESSION: 1. Low lung volumes with streaky bibasilar lung opacities compatible with atelectasis. Moderate eventration of the right hemidiaphragm. 2. Mild enlargement of the cardiopericardial silhouette without overt pulmonary edema. Electronically Signed   By: Delbert Phenix M.D.   On: 05/20/2021 14:39        Scheduled Meds:  enoxaparin (LOVENOX) injection  30 mg Subcutaneous Q24H   feeding supplement  237 mL Oral BID BM   Continuous Infusions:  albumin human 12.5 g (05/21/21 3335)   cefTRIAXone (ROCEPHIN)  IV       LOS: 1 day     Jacquelin Hawking, MD [facility] 05/21/2021, 7:43 AM  If 7PM-7AM, please contact night-coverage [email or web address]

## 2021-05-21 NOTE — Progress Notes (Signed)
   05/21/21 1930  Assess: MEWS Score  Temp 98.2 F (36.8 C)  BP 98/63 (Nurse notified Miguel Rota 05/21/21)  Pulse Rate (!) 106 (Nurse notified McKayla Moore 05/21/21)  Resp 16  SpO2 97 %  O2 Device Room Air  Assess: MEWS Score  MEWS Temp 0  MEWS Systolic 1  MEWS Pulse 1  MEWS RR 0  MEWS LOC 0  MEWS Score 2  MEWS Score Color Yellow  Assess: if the MEWS score is Yellow or Red  Were vital signs taken at a resting state? Yes  Focused Assessment No change from prior assessment  Does the patient meet 2 or more of the SIRS criteria? No  MEWS guidelines implemented *See Row Information* Yes  Treat  MEWS Interventions Other (Comment) (no interventions, patient stable and no change from prior assessment)  Take Vital Signs  Increase Vital Sign Frequency  Yellow: Q 2hr X 2 then Q 4hr X 2, if remains yellow, continue Q 4hrs  Escalate  MEWS: Escalate Yellow: discuss with charge nurse/RN and consider discussing with provider and RRT  Notify: Charge Nurse/RN  Name of Charge Nurse/RN Notified Orlena Sheldon RN  Date Charge Nurse/RN Notified 05/21/21  Time Charge Nurse/RN Notified 1935  Document  Patient Outcome Other (Comment) (no changes from prior assessment, patient is stable)  Progress note created (see row info) Yes  Assess: SIRS CRITERIA  SIRS Temperature  0  SIRS Pulse 1  SIRS Respirations  0  SIRS WBC 0  SIRS Score Sum  1   Patient in yellow MEWS due to elevated heart rate. No interventions needed. Patient is stable and there is no change from prior assessment, patient alert and oriented x 4. Yellow MEWS guidelines implemented and will continue to monitor patient.

## 2021-05-21 NOTE — Evaluation (Signed)
Physical Therapy Evaluation Patient Details Name: Hannah Blake MRN: [id number] DOB: [birth year] Today's Date: 05/21/2021  History of Present Illness  70 y.o. female with medical history significant for Bell's palsy diagnosed at [facility] ED on 03/17/2021, who presented to [facility] ED with complaints of gross hematuria via Foley catheter and worsening abdominal distention and bilateral lower extremity edema. Upon presentation to the ED, she had a paracentesis with 2 L of fluid removed.  Clinical Impression  On eval, pt required Min A for mobility. She walked ~120 feet with 1 HHA. Pt is unsteady and at risk for falls. Pt presents with general weakness, decreased activity tolerance, and impaired gait and balance. Family was present during session and the assisted with translating as needed. Will plan to follow and progress activity as tolerated. PT recommendation is for HHPT f/u and RW for ambulation safety.        Recommendations for follow up therapy are one component of a multi-disciplinary discharge planning process, led by the attending physician.  Recommendations may be updated based on patient status, additional functional criteria and insurance authorization.  Follow Up Recommendations Home health PT    Assistance Recommended at Discharge Intermittent Supervision/Assistance  Functional Status Assessment Patient has had a recent decline in their functional status and demonstrates the ability to make significant improvements in function in a reasonable and predictable amount of time.  Equipment Recommendations  Rolling walker (2 wheels)    Recommendations for Other Services       Precautions / Restrictions Precautions Precautions: Fall Restrictions Weight Bearing Restrictions: No      Mobility  Bed Mobility Overal bed mobility: Needs Assistance Bed Mobility: Supine to Sit;Sit to Supine     Supine to sit: Min assist Sit to supine: Min assist   General bed mobility comments:  family assisted    Transfers Overall transfer level: Needs assistance Equipment used: None Transfers: Sit to/from Stand Sit to Stand: Min guard           General transfer comment: Min guard for safety. Unsteady.    Ambulation/Gait Ambulation/Gait assistance: Min assist Gait Distance (Feet): 120 Feet Assistive device: 1 person hand held assist Gait Pattern/deviations: Step-through pattern;Decreased stride length     General Gait Details: Unsteady. 1 HHA support provided vs "furniture walking". Pt tolerated distance well. Denied dizziness  Stairs            Wheelchair Mobility    Modified Rankin (Stroke Patients Only)       Balance Overall balance assessment: Needs assistance;History of Falls Sitting-balance support: No upper extremity supported Sitting balance-Leahy Scale: Good     Standing balance support: During functional activity Standing balance-Leahy Scale: Poor                               Pertinent Vitals/Pain Pain Assessment: No/denies pain    Home Living Family/patient expects to be discharged to:: Private residence Living Arrangements: Children;Other relatives;Other (Comment) (daughter; 2 grandsons) Available Help at Discharge: Available 24 hours/day;Available PRN/intermittently Type of Home: House Home Access: Stairs to enter Entrance Stairs-Rails: Right Entrance Stairs-Number of Steps: Full flight to 2nd floor apartment. No elevator. Pt reports that she rarely leaves tha apartment.   Home Layout: One level Home Equipment: None      Prior Function Prior Level of Function : History of Falls (last six months)             Mobility Comments: Adult Grandson  reports that pt has fallen regularly since he was young stating that pt can be "clumsy".  Pt also with significantly impaired vision which may contribute.  Pt furniture cruises at baseline.  Pt does not like beds and sleeps on the floor. Pt has been able to stand from  floor independently until about 1-2 months ago with BEll's Plasy onset. Since that time, pt crawls to couch to pull up on for assistance. ADLs Comments: Family assists with cooking/cleaning/laundry as needed and with transportation as pt does not drive. Pt performs her own BADLs with family supervision in home.     Hand Dominance   Dominant Hand: Right    Extremity/Trunk Assessment   Upper Extremity Assessment Upper Extremity Assessment: Defer to OT evaluation    Lower Extremity Assessment Lower Extremity Assessment: Generalized weakness (bil LE edema)    Cervical / Trunk Assessment Cervical / Trunk Assessment: Normal  Communication   Communication: Prefers language other than English ([place] understands and speaks a little English;family tends to translate)  Cognition Arousal/Alertness: Awake/alert Behavior During Therapy: WFL for tasks assessed/performed Overall Cognitive Status: Difficult to assess                                          General Comments      Exercises Other Exercises Other Exercises: Pt/grandson educated on simple safety adjustments to home to compensate for low vision and reduce risk of falls. Grandson receptive and verbalized undertsdanding to all education.  Pt educated on elevation of BLEs ad lib.   Assessment/Plan    PT Assessment Patient needs continued PT services  PT Problem List Decreased strength;Decreased mobility;Decreased activity tolerance;Decreased balance;Decreased knowledge of use of DME       PT Treatment Interventions DME instruction;Gait training;Therapeutic activities;Patient/family education;Therapeutic exercise;Balance training;Functional mobility training    PT Goals (Current goals can be found in the Care Plan section)  Acute Rehab PT Goals Patient Stated Goal: to feel better PT Goal Formulation: With patient/family Time For Goal Achievement: 06/04/21 Potential to Achieve Goals: Good    Frequency  Min 3X/week   Barriers to discharge        Co-evaluation               AM-PAC PT "6 Clicks" Mobility  Outcome Measure Help needed turning from your back to your side while in a flat bed without using bedrails?: A Little Help needed moving from lying on your back to sitting on the side of a flat bed without using bedrails?: A Little Help needed moving to and from a bed to a chair (including a wheelchair)?: A Little Help needed standing up from a chair using your arms (e.g., wheelchair or bedside chair)?: A Little Help needed to walk in hospital room?: A Little Help needed climbing 3-5 steps with a railing? : A Little 6 Click Score: 18    End of Session Equipment Utilized During Treatment: Gait belt Activity Tolerance: Patient tolerated treatment well Patient left: in bed;with call bell/phone within reach;with family/visitor present   PT Visit Diagnosis: Unsteadiness on feet (R26.81);History of falling (Z91.81);Muscle weakness (generalized) (M62.81);Difficulty in walking, not elsewhere classified (R26.2)    Time: 0350-0938 PT Time Calculation (min) (ACUTE ONLY): 15 min   Charges:   PT Evaluation $PT Eval Moderate Complexity: 1 Mod        Faye Ramsay, PT Acute Rehabilitation  Office: [phone] Pager: [phone]

## 2021-05-21 NOTE — Progress Notes (Signed)
  Echocardiogram 2D Echocardiogram has been performed.  Hannah Blake 05/21/2021, 1:10 PM

## 2021-05-21 NOTE — Progress Notes (Signed)
Bilateral lower extremity venous duplex has been completed. Preliminary results can be found in CV Proc through chart review.   05/21/21 8:54 AM Olen Cordial RVT

## 2021-05-22 DIAGNOSIS — I5021 Acute systolic (congestive) heart failure: Secondary | ICD-10-CM

## 2021-05-22 LAB — CBC
HCT: 22.1 % — ABNORMAL LOW (ref 36.0–46.0)
Hemoglobin: 7.1 g/dL — ABNORMAL LOW (ref 12.0–15.0)
MCH: 30.7 pg (ref 26.0–34.0)
MCHC: 32.1 g/dL (ref 30.0–36.0)
MCV: 95.7 fL (ref 80.0–100.0)
Platelets: 131 10*3/uL — ABNORMAL LOW (ref 150–400)
RBC: 2.31 MIL/uL — ABNORMAL LOW (ref 3.87–5.11)
RDW: 18.7 % — ABNORMAL HIGH (ref 11.5–15.5)
WBC: 10.4 10*3/uL (ref 4.0–10.5)
nRBC: 0 % (ref 0.0–0.2)

## 2021-05-22 LAB — CYTOLOGY - NON PAP

## 2021-05-22 LAB — URINE CULTURE: Culture: >=100000 — AB

## 2021-05-22 LAB — BASIC METABOLIC PANEL
Anion gap: 5 (ref 5–15)
BUN: 25 mg/dL — ABNORMAL HIGH (ref 8–23)
CO2: 22 mmol/L (ref 22–32)
Calcium: 7.3 mg/dL — ABNORMAL LOW (ref 8.9–10.3)
Chloride: 106 mmol/L (ref 98–111)
Creatinine, Ser: 1.28 mg/dL — ABNORMAL HIGH (ref 0.44–1.00)
GFR, Estimated: 45 mL/min — ABNORMAL LOW (ref >60)
Glucose, Bld: 94 mg/dL (ref 70–99)
Potassium: 3.3 mmol/L — ABNORMAL LOW (ref 3.5–5.1)
Sodium: 133 mmol/L — ABNORMAL LOW (ref 135–145)

## 2021-05-22 LAB — PATHOLOGIST SMEAR REVIEW

## 2021-05-22 MED ORDER — FUROSEMIDE 40 MG PO TABS
40.0000 mg | ORAL_TABLET | Freq: Every day | ORAL | Status: DC
  Administered 2021-05-22 – 2021-05-23 (×2): 40 mg via ORAL
  Filled 2021-05-22 (×2): qty 1

## 2021-05-22 MED ORDER — CEFAZOLIN SODIUM-DEXTROSE 1-4 GM/50ML-% IV SOLN
1.0000 g | Freq: Three times a day (TID) | INTRAVENOUS | Status: DC
  Filled 2021-05-22 (×2): qty 50

## 2021-05-22 MED ORDER — FUROSEMIDE 8 MG/ML PO SOLN: 40.0000 mg | Freq: Every day | ORAL | Status: DC

## 2021-05-22 MED ORDER — SPIRONOLACTONE 25 MG PO TABS
50.0000 mg | ORAL_TABLET | Freq: Every day | ORAL | Status: DC
  Administered 2021-05-22 – 2021-05-23 (×2): 50 mg via ORAL
  Filled 2021-05-22 (×2): qty 2

## 2021-05-22 NOTE — Progress Notes (Addendum)
PROGRESS NOTE    Hannah Blake  IHK:[id number] DOB: 1950/11/09 DOA: 05/20/2021 PCP: Oneita Hurt, No   Brief Narrative: Hannah Blake is a 70 y.o. female with a history of Bell's Palsy. Patient presented secondary to gross hematuria in setting of foley catheter. Evidence of significant fluid overload in setting of cirrhosis.   Assessment & Plan:   Principal Problem:   AKI (acute kidney injury) (HCC) Active Problems:   Protein-calorie malnutrition, severe   Cirrhosis of liver with ascites (HCC)   UTI (urinary tract infection) due to urinary indwelling Foley catheter (HCC)   Acute urinary retention   AKI Thought secondary to intravascular volume depletion. Patient given IV fluids on admission. Creatinine improved. Overall though, patient is fluid overloaded. .  Cirrhosis with ascites Fluid overload Patient underwent paracentesis in the ED on 10/31. [facility] consulted on admission. Ascites not consistent with SBP. Associated bilirubin of 5.5 and INR of 1.4. Cytology pending. -Lasix 20 mg IV daily -2 g sodium diet with fluid restriction -Follow-up cytology -GI consulted; recommendations pending -Strict in/out  CAUTI Foley catheter present on admission. Urine culture obtained on admission and is significant for E. Coli. Ceftriaxone IV initiated on admission -Continue Ceftriaxone IV; will need prolonged course for complicated UTI  Gross hematuria Urology consulted on admission. Hematuria is improved this morning  Acute anemia Recent baseline of 8.5 from 10/12. Hemoglobin down to 7.1 in setting of hematuria. -CBC in AM -Transfuse for hemoglobin <7 -Repeat CBC if recurrent hematuria  Acute urinary retension Patient presented with a foley catheter which was previously placed on 10/12 by Urology. Replaced 11/1.  Non-anion gap metabolic acidosis Mild. In setting of AKI.  Bilateral lower extremity edema Severe. Likely related to presumed cirrhosis. Albumin is also undetectable on  metabolic panel. Patient given albumin 12.5 g q6 hours x3. -Diuretic as mentioned above  HFrEF Transthoracic Echocardiogram with reduced EF. Unsure of exact percentage at this time. -Cardiology consult for recommendations prior to discharge/outpatient follow-up  Prolonged QTc QTc of 530 msec on admission.  Severe malnutrition Dietitian consulted. Recommendations pending.  Bells Palsy Left sided. Stable.   DVT prophylaxis: Lovenox Code Status:   Code Status: Full Code Family Communication: Grandson at bedside Disposition Plan: Discharge home likely in several days pending improvement of edema, AKI, transition to oral antibiotics, specialist recommendations for discharge   Consultants:  [facility] gastroenterology Urology  Procedures:  FOLEY (11/1 >> TRANSTHORACIC ECHOCARDIOGRAM (05/21/2021) IMPRESSIONS     1. LVEF is 45 to 50% with hypokinesis/akinesis of the distal  inferior,distal lateral,distal inferoseptal walls . Left ventricular  ejection fraction, by estimation, is 40 to 45%. The left ventricle has  mildly decreased function. Indeterminate diastolic  filling due to E-A fusion.   2. Right ventricular systolic function is normal. The right ventricular  size is normal.   3. Left atrial size was mildly dilated.   4. The mitral valve is normal in structure. Trivial mitral valve  regurgitation.   5. The aortic valve is normal in structure. Aortic valve regurgitation is  mild.   Antimicrobials: Ceftriaxone IV    Subjective: Interpreter: Morey Hummingbird #[id number]  Patient reports some increased abdominal distension. No chest pain or dyspnea.   Objective: Vitals:   05/21/21 2330 05/22/21 0330 05/22/21 0335 05/22/21 0736  BP: 98/62 (!) 95/47  101/64  Pulse: 98 89  97  Resp: 16 16  18   Temp: 98.1 F (36.7 C) (!) 97.4 F (36.3 C)  98.1 F (36.7 C)  TempSrc: Oral Oral  Oral  SpO2: 97% 97%  97%  Weight:   56.5 kg   Height:        Intake/Output Summary (Last 24 hours) at  05/22/2021 1027 Last data filed at 05/21/2021 1500 Gross per 24 hour  Intake 121.73 ml  Output --  Net 121.73 ml    Filed Weights   05/20/21 2129 05/21/21 0236 05/22/21 0335  Weight: 56.7 kg 56.7 kg 56.5 kg    Examination:  General exam: Appears calm and comfortable Respiratory system: Clear to auscultation. Respiratory effort normal. Cardiovascular system: S1 & S2 heard, RRR. Systolic murmur Gastrointestinal system: Abdomen is nondistended, soft and nontender. No organomegaly or masses felt. Normal bowel sounds heard. Central nervous system: Alert and oriented. No focal neurological deficits. Musculoskeletal: 2+ BLE edema. No calf tenderness Skin: No cyanosis. No rashes Psychiatry: Judgement and insight appear normal. Mood & affect appropriate.     Data Reviewed: I have personally reviewed following labs and imaging studies  CBC Lab Results  Component Value Date   WBC 10.4 05/22/2021   RBC 2.31 (L) 05/22/2021   HGB 7.1 (L) 05/22/2021   HCT 22.1 (L) 05/22/2021   MCV 95.7 05/22/2021   MCH 30.7 05/22/2021   PLT 131 (L) 05/22/2021   MCHC 32.1 05/22/2021   RDW 18.7 (H) 05/22/2021   LYMPHSABS 2.3 05/20/2021   MONOABS 0.6 05/20/2021   EOSABS 0.1 05/20/2021   BASOSABS 0.1 [id number]     Last metabolic panel Lab Results  Component Value Date   NA 133 (L) 05/22/2021   K 3.3 (L) 05/22/2021   CL 106 05/22/2021   CO2 22 05/22/2021   BUN 25 (H) 05/22/2021   CREATININE 1.28 (H) 05/22/2021   GLUCOSE 94 05/22/2021   GFRNONAA 45 (L) 05/22/2021   CALCIUM 7.3 (L) 05/22/2021   PHOS 3.8 05/21/2021   PROT 6.6 05/21/2021   ALBUMIN <1.5 (L) 05/21/2021   BILITOT 5.5 (H) 05/21/2021   ALKPHOS 267 (H) 05/21/2021   AST 54 (H) 05/21/2021   ALT 21 05/21/2021   ANIONGAP 5 05/22/2021    CBG (last 3)  No results for input(s): GLUCAP in the last 72 hours.   GFR: Estimated Creatinine Clearance: 33.3 mL/min (A) (by C-G formula based on SCr of 1.28 mg/dL (H)).  Coagulation  Profile: Recent Labs  Lab 05/20/21 1201  INR 1.4*     Recent Results (from the past 240 hour(s))  Urine Culture     Status: Abnormal   Collection Time: 05/20/21 12:01 PM   Specimen: Urine, Random  Result Value Ref Range Status   Specimen Description   Final    URINE, RANDOM Performed at [facility], [address] [place],  [address]    Special Requests   Final    NONE Performed at [facility], [address] [place], [place] [address]    Culture >=100,000 COLONIES/mL ESCHERICHIA COLI (A)  Final   Report Status 05/22/2021 FINAL  Final   Organism ID, Bacteria ESCHERICHIA COLI (A)  Final      Susceptibility   Escherichia coli - MIC*    AMPICILLIN >=32 RESISTANT Resistant     CEFAZOLIN 8 SENSITIVE Sensitive     CEFEPIME <=0.12 SENSITIVE Sensitive     CEFTRIAXONE <=0.25 SENSITIVE Sensitive     CIPROFLOXACIN <=0.25 SENSITIVE Sensitive     GENTAMICIN <=1 SENSITIVE Sensitive     IMIPENEM <=0.25 SENSITIVE Sensitive     NITROFURANTOIN <=16 SENSITIVE Sensitive     TRIMETH/SULFA <=  20 SENSITIVE Sensitive     AMPICILLIN/SULBACTAM >=32 RESISTANT Resistant     PIP/TAZO 64 INTERMEDIATE Intermediate     * >=100,000 COLONIES/mL ESCHERICHIA COLI  Resp Panel by RT-PCR (Flu A&B, Covid) Nasopharyngeal Swab     Status: None   Collection Time: 05/20/21  3:13 PM   Specimen: Nasopharyngeal Swab; Nasopharyngeal(NP) swabs in vial transport medium  Result Value Ref Range Status   SARS Coronavirus 2 by RT PCR NEGATIVE NEGATIVE Final    Comment: (NOTE) SARS-CoV-2 target nucleic acids are NOT DETECTED.  The SARS-CoV-2 RNA is generally detectable in upper respiratory specimens during the acute phase of infection. The lowest concentration of SARS-CoV-2 viral copies this assay can detect is 138 copies/mL. A negative result does not preclude SARS-Cov-2 infection and should not be used as the sole basis for treatment or other patient management decisions. A negative  result may occur with  improper specimen collection/handling, submission of specimen other than nasopharyngeal swab, presence of viral mutation(s) within the areas targeted by this assay, and inadequate number of viral copies(<138 copies/mL). A negative result must be combined with clinical observations, patient history, and epidemiological information. The expected result is Negative.  Fact Sheet for Patients:  BloggerCourse.comhttps://www.fda.gov/media/152166/download  Fact Sheet for Healthcare Providers:  SeriousBroker.ithttps://www.fda.gov/media/152162/download  This test is no t yet approved or cleared by the [place] FDA and  has been authorized for detection and/or diagnosis of SARS-CoV-2 by FDA under an Emergency Use Authorization (EUA). This EUA will remain  in effect (meaning this test can be used) for the duration of the COVID-19 declaration under Section 564(b)(1) of the Act, 21 U.S.C.section 360bbb-3(b)(1), unless the authorization is terminated  or revoked sooner.       Influenza A by PCR NEGATIVE NEGATIVE Final   Influenza B by PCR NEGATIVE NEGATIVE Final    Comment: (NOTE) The Xpert Xpress SARS-CoV-2/FLU/RSV plus assay is intended as an aid in the diagnosis of influenza from Nasopharyngeal swab specimens and should not be used as a sole basis for treatment. Nasal washings and aspirates are unacceptable for Xpert Xpress SARS-CoV-2/FLU/RSV testing.  Fact Sheet for Patients: BloggerCourse.comhttps://www.fda.gov/media/152166/download  Fact Sheet for Healthcare Providers: SeriousBroker.ithttps://www.fda.gov/media/152162/download  This test is not yet approved or cleared by the [place] FDA and has been authorized for detection and/or diagnosis of SARS-CoV-2 by FDA under an Emergency Use Authorization (EUA). This EUA will remain in effect (meaning this test can be used) for the duration of the COVID-19 declaration under Section 564(b)(1) of the Act, 21 U.S.C. section 360bbb-3(b)(1), unless the authorization is  terminated or revoked.  Performed at [facility], [address] [place], [place] [address]   Blood culture (routine x 2)     Status: None (Preliminary result)   Collection Time: 05/20/21  4:12 PM   Specimen: Left Antecubital; Blood  Result Value Ref Range Status   Specimen Description   Final    LEFT ANTECUBITAL Performed at [facility] [place], [address] [place], [place] [address]    Special Requests   Final    BOTTLES DRAWN AEROBIC AND ANAEROBIC Blood Culture adequate volume Performed at [facility] [place], [address] [place], [place] [address]    Culture   Final    NO GROWTH 2 DAYS Performed at [facility], [address] [place], [place] [address]    Report Status PENDING  Incomplete  Blood culture (routine x 2)     Status: None (Preliminary result)  Collection Time: 05/20/21 10:10 PM   Specimen: BLOOD RIGHT WRIST  Result Value Ref Range Status   Specimen Description   Final    BLOOD RIGHT WRIST Performed at [facility] [address], [place], [place] [address]    Special Requests   Final    BOTTLES DRAWN AEROBIC ONLY Blood Culture adequate volume Performed at [facility] [address], [place], [place] [address]    Culture   Final    NO GROWTH 1 DAY Performed at [facility], [place] [address], [place], [place] [address]    Report Status PENDING  Incomplete         Radiology Studies: DG Chest Port 1 View  Result Date: 05/20/2021 CLINICAL DATA:  Acute kidney injury, hematuria, lower extremity swelling EXAM: PORTABLE CHEST 1 VIEW COMPARISON:  None. FINDINGS: Low lung volumes. Mild enlargement of the cardiopericardial silhouette. Atherosclerotic aortic arch. Otherwise normal mediastinal contour. No pneumothorax. No pleural effusion. Moderate eventration of the right hemidiaphragm. No overt pulmonary edema. Streaky bibasilar lung opacities compatible with atelectasis. IMPRESSION: 1.  Low lung volumes with streaky bibasilar lung opacities compatible with atelectasis. Moderate eventration of the right hemidiaphragm. 2. Mild enlargement of the cardiopericardial silhouette without overt pulmonary edema. Electronically Signed   By: Ilona Sorrel M.D.   On: 05/20/2021 14:39   ECHOCARDIOGRAM COMPLETE  Result Date: 05/21/2021    ECHOCARDIOGRAM REPORT   Patient Name:   ANGELIGUE [facility]APPLEYEA [facility] Date of Exam: 05/21/2021 Medical Rec #:  [id number]  Height:       63.0 in Accession #:    [id number] Weight:       125.0 lb Date of Birth:  August 03, 1950  BSA:          1.584 m Patient Age:    22 years   BP:           104/63 mmHg Patient Gender: F          HR:           99 bpm. Exam Location:  Inpatient Procedure: 2D Echo, Cardiac Doppler and Color Doppler Indications:    Abnormal ECG R94.31  History:        Patient has no prior history of Echocardiogram examinations.                 Arrythmias:Prolonged QTC. Suspected cirrhosis with large ascites                 status post paracentesis. Severe bilateral lower extremity                 edema.  Sonographer:    Darlina Sicilian RDCS Referring Phys: [id number] [place]  1. LVEF is 45 to 50% with hypokinesis/akinesis of the distal inferior,distal lateral,distal inferoseptal walls . Left ventricular ejection fraction, by estimation, is 40 to 45%. The left ventricle has mildly decreased function. Indeterminate diastolic filling due to E-A fusion.  2. Right ventricular systolic function is normal. The right ventricular size is normal.  3. Left atrial size was mildly dilated.  4. The mitral valve is normal in structure. Trivial mitral valve regurgitation.  5. The aortic valve is normal in structure. Aortic valve regurgitation is mild. FINDINGS  Left Ventricle: LVEF is 45 to 50% with hypokinesis/akinesis of the distal inferior,distal lateral,distal inferoseptal walls. Left ventricular ejection fraction, by estimation, is 40 to 45%. The left ventricle has mildly decreased  function. The left ventricular internal cavity size was normal  in size. There is no left ventricular hypertrophy. Indeterminate diastolic filling due to E-A fusion. Right Ventricle: The right ventricular size is normal. Right vetricular wall thickness was not assessed. Right ventricular systolic function is normal. Left Atrium: Left atrial size was mildly dilated. Right Atrium: Right atrial size was normal in size. Pericardium: Trivial pericardial effusion is present. Mitral Valve: The mitral valve is normal in structure. Trivial mitral valve regurgitation. Tricuspid Valve: The tricuspid valve is normal in structure. Tricuspid valve regurgitation is trivial. Aortic Valve: The aortic valve is normal in structure. Aortic valve regurgitation is mild. Aortic regurgitation PHT measures 453 msec. Pulmonic Valve: The pulmonic valve was normal in structure. Pulmonic valve regurgitation is not visualized. Aorta: The aortic root and ascending aorta are structurally normal, with no evidence of dilitation. IAS/Shunts: No atrial level shunt detected by color flow Doppler.  LEFT VENTRICLE PLAX 2D LVIDd:         4.00 cm LVIDs:         2.90 cm LV PW:         0.60 cm LV IVS:        1.00 cm LVOT diam:     2.10 cm LV SV:         53 LV SV Index:   34 LVOT Area:     3.46 cm  RIGHT VENTRICLE RV S prime:     15.50 cm/s TAPSE (M-mode): 1.8 cm LEFT ATRIUM             Index        RIGHT ATRIUM          Index LA diam:        2.90 cm 1.83 cm/m   RA Area:     6.11 cm LA Vol (A2C):   19.4 ml 12.25 ml/m  RA Volume:   8.33 ml  5.26 ml/m LA Vol (A4C):   21.9 ml 13.83 ml/m LA Biplane Vol: 22.6 ml 14.27 ml/m  AORTIC VALVE LVOT Vmax:   83.90 cm/s LVOT Vmean:  57.400 cm/s LVOT VTI:    0.154 m AI PHT:      453 msec  AORTA Ao Root diam: 3.50 cm Ao STJ diam:  2.6 cm Ao Asc diam:  3.70 cm  SHUNTS Systemic VTI:  0.15 m Systemic Diam: 2.10 cm Dorris Carnes MD Electronically signed by Dorris Carnes MD Signature Date/Time: 05/21/2021/1:27:00 PM    Final     VAS [place] LOWER EXTREMITY VENOUS (DVT)  Result Date: 05/21/2021  Lower Venous DVT Study Patient Name:  KISSIE DANT [facility]  Date of Exam:   05/21/2021 Medical Rec #: [id number]   Accession #:    [id number] Date of Birth: 1951-05-05   Patient Gender: F Patient Age:   57 years Exam Location:  [facility] Procedure:      VAS [place] LOWER EXTREMITY VENOUS (DVT) Referring Phys: Archie Patten HALL --------------------------------------------------------------------------------  Indications: Edema.  Risk Factors: None identified. Limitations: Poor ultrasound/tissue interface. Comparison Study: No prior studies. Performing Technologist: Oliver Hum RVT  Examination Guidelines: A complete evaluation includes B-mode imaging, spectral Doppler, color Doppler, and power Doppler as needed of all accessible portions of each vessel. Bilateral testing is considered an integral part of a complete examination. Limited examinations for reoccurring indications may be performed as noted. The reflux portion of the exam is performed with the patient in reverse Trendelenburg.  +---------+---------------+---------+-----------+----------+--------------+ RIGHT    CompressibilityPhasicitySpontaneityPropertiesThrombus Aging +---------+---------------+---------+-----------+----------+--------------+ CFV      Full  Yes      Yes                                 +---------+---------------+---------+-----------+----------+--------------+ SFJ      Full                                                        +---------+---------------+---------+-----------+----------+--------------+ FV Prox  Full                                                        +---------+---------------+---------+-----------+----------+--------------+ FV Mid   Full                                                        +---------+---------------+---------+-----------+----------+--------------+ FV DistalFull                                                         +---------+---------------+---------+-----------+----------+--------------+ PFV      Full                                                        +---------+---------------+---------+-----------+----------+--------------+ POP      Full           Yes      Yes                                 +---------+---------------+---------+-----------+----------+--------------+ PTV      Full                                                        +---------+---------------+---------+-----------+----------+--------------+ PERO     Full                                                        +---------+---------------+---------+-----------+----------+--------------+   +---------+---------------+---------+-----------+----------+--------------+ LEFT     CompressibilityPhasicitySpontaneityPropertiesThrombus Aging +---------+---------------+---------+-----------+----------+--------------+ CFV      Full           Yes      Yes                                 +---------+---------------+---------+-----------+----------+--------------+ SFJ  Full                                                        +---------+---------------+---------+-----------+----------+--------------+ FV Prox  Full                                                        +---------+---------------+---------+-----------+----------+--------------+ FV Mid   Full                                                        +---------+---------------+---------+-----------+----------+--------------+ FV DistalFull                                                        +---------+---------------+---------+-----------+----------+--------------+ PFV      Full                                                        +---------+---------------+---------+-----------+----------+--------------+ POP      Full           Yes      Yes                                  +---------+---------------+---------+-----------+----------+--------------+ PTV      Full                                                        +---------+---------------+---------+-----------+----------+--------------+ PERO     Full                                                        +---------+---------------+---------+-----------+----------+--------------+     Summary: RIGHT: - There is no evidence of deep vein thrombosis in the lower extremity.  - No cystic structure found in the popliteal fossa.  LEFT: - There is no evidence of deep vein thrombosis in the lower extremity.  - No cystic structure found in the popliteal fossa.  *See table(s) above for measurements and observations. Electronically signed by Deitra Mayo MD on 05/21/2021 at 2:01:37 PM.    Final         Scheduled Meds:  (feeding supplement) PROSource Plus  30 mL Oral BID BM   Chlorhexidine Gluconate Cloth  6 each Topical Daily   feeding supplement  237 mL Oral BID BM  furosemide  20 mg Intravenous Daily   multivitamin with minerals  1 tablet Oral Daily   Continuous Infusions:  cefTRIAXone (ROCEPHIN)  IV 1 g (05/21/21 1646)     LOS: 2 days     Cordelia Poche, MD [facility] 05/22/2021, 10:27 AM  If 7PM-7AM, please contact night-coverage [email or web address]

## 2021-05-22 NOTE — Consult Note (Addendum)
Referring Provider: Dr. Irene Pap   Primary Care Physician:  Pcp, No Primary Gastroenterologist:  Althia Forts  Reason for Consultation:  Suspected Cirrhosis with ascites  HPI: Hannah Blake is a 70 y.o. female with medical history significant for Bell's palsy diagnosed at [place] ED on 03/17/2021, who presented to [facility] ED with complaints of gross hematuria via Foley catheter and worsening abdominal distention and bilateral lower extremity edema.   A CT scan on at previous admission on 05/01/2021 was significant for Slightly irregular contour of the liver suggesting cirrhosis with ascites and cholelithiasis.  She was discharged and instructed to follow up with GI.  At current hospitalization she presented with worsening abdominal distention and lower extremity edema.   [employer] interpreter, Chrissie Noa [phone], was used, grandson in room. Patient states she is never had an episode like this prior. 2 months ago she was diagnosed with Bell's palsy, weight at that time was 107, then patient states she started to have swelling in abdomen and legs weight went to 125.  Patient will have epigastric discomfort, with bloating, may be better with food. Patient denies nausea, vomiting, dysphagia. Patient was having difficulty urinating and having bowel movement prior to this admission.  Stool appeared to be more ash colored, denies melena or hematochezia. Patient was having some wheezing and shortness of breath while her abdomen was swollen.  Denies chest pain. Patient stopped drinking 2 months ago however prior to that was drinking at least 1-2 beers for over 30 years. Patient denies history of blood transfusion, IV drug use, family history of liver disease. Patient is never had an endoscopy or colonoscopy. Denies family history of colon cancer.  Upon presentation to the ED, she had a paracentesis with 2 L of fluid removed by EDP.  Ascites not consistent with SBP. Patient was started on Lasix 20mg  IV  daily and given sodium restricted diet. Endorses occasional use of alcohol, about 1 beer per week.  Meld score 24, 19.6% estimated 3 months mortality.   Past Medical History:  Diagnosis Date   Bell's palsy     Past Surgical History:  Procedure Laterality Date   CLAVICLE SURGERY     FRACTURE SURGERY      Prior to Admission medications   Not on File    Scheduled Meds:  (feeding supplement) PROSource Plus  30 mL Oral BID BM   Chlorhexidine Gluconate Cloth  6 each Topical Daily   enoxaparin (LOVENOX) injection  40 mg Subcutaneous Q24H   feeding supplement  237 mL Oral BID BM   furosemide  20 mg Intravenous Daily   multivitamin with minerals  1 tablet Oral Daily   Continuous Infusions:  cefTRIAXone (ROCEPHIN)  IV 1 g (05/21/21 1646)   PRN Meds:.alum & mag hydroxide-simeth, melatonin, polyethylene glycol, prochlorperazine  Allergies as of 05/20/2021   (No Known Allergies)    No family history on file.  Social History   Socioeconomic History   Marital status: Single    Spouse name: Not on file   Number of children: Not on file   Years of education: Not on file   Highest education level: Not on file  Occupational History   Not on file  Tobacco Use   Smoking status: Every Day    Types: Cigarettes   Smokeless tobacco: Never  Vaping Use   Vaping Use: Never used  Substance and Sexual Activity   Alcohol use: Not Currently    Comment: weekly   Drug use: Never   Sexual activity:  Not on file  Other Topics Concern   Not on file  Social History Narrative   Not on file   Social Determinants of Health   Financial Resource Strain: Not on file  Food Insecurity: Not on file  Transportation Needs: Not on file  Physical Activity: Not on file  Stress: Not on file  Social Connections: Not on file  Intimate Partner Violence: Not on file    Review of Systems:  Review of Systems  Constitutional:  Negative for chills, fever and weight loss.  Respiratory:  Positive for  wheezing. Negative for cough.   Cardiovascular:  Negative for chest pain.  Gastrointestinal:  Positive for constipation. Negative for abdominal pain, blood in stool, diarrhea, heartburn, melena, nausea and vomiting.  Genitourinary:  Positive for hematuria.  Musculoskeletal:  Negative for falls.  Skin:  Negative for rash.  Neurological:  Negative for loss of consciousness.  Psychiatric/Behavioral:  Negative for memory loss.     Physical Exam: Vital signs: Vitals:   05/22/21 0330 05/22/21 0736  BP: (!) 95/47 101/64  Pulse: 89 97  Resp: 16 18  Temp: (!) 97.4 F (36.3 C) 98.1 F (36.7 C)  SpO2: 97% 97%   Last BM Date: 05/21/21  General Alert, in NAD. + scleral icterus. Lung: Right lower lobe coarse breath sounds, no wheezing Heart: Regular rate and rhythm; no murmurs,  Abdomen: Soft. Nontender. distended. sluggish bowel sounds. No rebound or guarding. + shifting dullness in the flanks.  Skin: + jaundice. No palmar erythema or spider angioma. Neuro Alert and oriented x4;  grossly normal neurologically; no asterixis or clonus.  GI:  Lab Results: Recent Labs    05/20/21 2220 05/21/21 0447 05/22/21 0454  WBC 13.6* 13.5* 10.4  HGB 8.6* 7.8* 7.1*  HCT 27.0* 24.4* 22.1*  PLT 158 148* 131*   BMET Recent Labs    05/20/21 1201 05/20/21 2220 05/21/21 0447 05/22/21 0454  NA 135  --  137 133*  K 3.9  --  3.9 3.3*  CL 106  --  110 106  CO2 20*  --  21* 22  GLUCOSE 108*  --  104* 94  BUN 28*  --  29* 25*  CREATININE 1.80* 1.41* 1.17* 1.28*  CALCIUM 7.7*  --  7.6* 7.3*   LFT Recent Labs    05/21/21 0447  PROT 6.6  ALBUMIN <1.5*  AST 54*  ALT 21  ALKPHOS 267*  BILITOT 5.5*   PT/INR Recent Labs    05/20/21 1201  LABPROT 16.9*  INR 1.4*    Studies/Results: DG Chest Port 1 View  Result Date: 05/20/2021 CLINICAL DATA:  Acute kidney injury, hematuria, lower extremity swelling EXAM: PORTABLE CHEST 1 VIEW COMPARISON:  None. FINDINGS: Low lung volumes. Mild  enlargement of the cardiopericardial silhouette. Atherosclerotic aortic arch. Otherwise normal mediastinal contour. No pneumothorax. No pleural effusion. Moderate eventration of the right hemidiaphragm. No overt pulmonary edema. Streaky bibasilar lung opacities compatible with atelectasis. IMPRESSION: 1. Low lung volumes with streaky bibasilar lung opacities compatible with atelectasis. Moderate eventration of the right hemidiaphragm. 2. Mild enlargement of the cardiopericardial silhouette without overt pulmonary edema. Electronically Signed   By: Delbert Phenix M.D.   On: 05/20/2021 14:39   ECHOCARDIOGRAM COMPLETE  Result Date: 05/21/2021    ECHOCARDIOGRAM REPORT   Patient Name:   SHEYLA ZAFFINO [facility] Date of Exam: 05/21/2021 Medical Rec #:  [id number]  Height:       63.0 in Accession #:    [id number] Weight:  125.0 lb Date of Birth:  1951-01-19  BSA:          1.584 m Patient Age:    33 years   BP:           104/63 mmHg Patient Gender: F          HR:           99 bpm. Exam Location:  Inpatient Procedure: 2D Echo, Cardiac Doppler and Color Doppler Indications:    Abnormal ECG R94.31  History:        Patient has no prior history of Echocardiogram examinations.                 Arrythmias:Prolonged QTC. Suspected cirrhosis with large ascites                 status post paracentesis. Severe bilateral lower extremity                 edema.  Sonographer:    Darlina Sicilian RDCS Referring Phys: [id number] [place]  1. LVEF is 45 to 50% with hypokinesis/akinesis of the distal inferior,distal lateral,distal inferoseptal walls . Left ventricular ejection fraction, by estimation, is 40 to 45%. The left ventricle has mildly decreased function. Indeterminate diastolic filling due to E-A fusion.  2. Right ventricular systolic function is normal. The right ventricular size is normal.  3. Left atrial size was mildly dilated.  4. The mitral valve is normal in structure. Trivial mitral valve regurgitation.  5. The aortic  valve is normal in structure. Aortic valve regurgitation is mild. FINDINGS  Left Ventricle: LVEF is 45 to 50% with hypokinesis/akinesis of the distal inferior,distal lateral,distal inferoseptal walls. Left ventricular ejection fraction, by estimation, is 40 to 45%. The left ventricle has mildly decreased function. The left ventricular internal cavity size was normal in size. There is no left ventricular hypertrophy. Indeterminate diastolic filling due to E-A fusion. Right Ventricle: The right ventricular size is normal. Right vetricular wall thickness was not assessed. Right ventricular systolic function is normal. Left Atrium: Left atrial size was mildly dilated. Right Atrium: Right atrial size was normal in size. Pericardium: Trivial pericardial effusion is present. Mitral Valve: The mitral valve is normal in structure. Trivial mitral valve regurgitation. Tricuspid Valve: The tricuspid valve is normal in structure. Tricuspid valve regurgitation is trivial. Aortic Valve: The aortic valve is normal in structure. Aortic valve regurgitation is mild. Aortic regurgitation PHT measures 453 msec. Pulmonic Valve: The pulmonic valve was normal in structure. Pulmonic valve regurgitation is not visualized. Aorta: The aortic root and ascending aorta are structurally normal, with no evidence of dilitation. IAS/Shunts: No atrial level shunt detected by color flow Doppler.  LEFT VENTRICLE PLAX 2D LVIDd:         4.00 cm LVIDs:         2.90 cm LV PW:         0.60 cm LV IVS:        1.00 cm LVOT diam:     2.10 cm LV SV:         53 LV SV Index:   34 LVOT Area:     3.46 cm  RIGHT VENTRICLE RV S prime:     15.50 cm/s TAPSE (M-mode): 1.8 cm LEFT ATRIUM             Index        RIGHT ATRIUM          Index LA diam:  2.90 cm 1.83 cm/m   RA Area:     6.11 cm LA Vol (A2C):   19.4 ml 12.25 ml/m  RA Volume:   8.33 ml  5.26 ml/m LA Vol (A4C):   21.9 ml 13.83 ml/m LA Biplane Vol: 22.6 ml 14.27 ml/m  AORTIC VALVE LVOT Vmax:   83.90  cm/s LVOT Vmean:  57.400 cm/s LVOT VTI:    0.154 m AI PHT:      453 msec  AORTA Ao Root diam: 3.50 cm Ao STJ diam:  2.6 cm Ao Asc diam:  3.70 cm  SHUNTS Systemic VTI:  0.15 m Systemic Diam: 2.10 cm Dorris Carnes MD Electronically signed by Dorris Carnes MD Signature Date/Time: 05/21/2021/1:27:00 PM    Final    VAS [place] LOWER EXTREMITY VENOUS (DVT)  Result Date: 05/21/2021  Lower Venous DVT Study Patient Name:  AMER JAYNES [facility]  Date of Exam:   05/21/2021 Medical Rec #: [id number]   Accession #:    [id number] Date of Birth: 1951/06/07   Patient Gender: F Patient Age:   32 years Exam Location:  [facility] Procedure:      VAS [place] LOWER EXTREMITY VENOUS (DVT) Referring Phys: Archie Patten HALL --------------------------------------------------------------------------------  Indications: Edema.  Risk Factors: None identified. Limitations: Poor ultrasound/tissue interface. Comparison Study: No prior studies. Performing Technologist: Oliver Hum RVT  Examination Guidelines: A complete evaluation includes B-mode imaging, spectral Doppler, color Doppler, and power Doppler as needed of all accessible portions of each vessel. Bilateral testing is considered an integral part of a complete examination. Limited examinations for reoccurring indications may be performed as noted. The reflux portion of the exam is performed with the patient in reverse Trendelenburg.  +---------+---------------+---------+-----------+----------+--------------+ RIGHT    CompressibilityPhasicitySpontaneityPropertiesThrombus Aging +---------+---------------+---------+-----------+----------+--------------+ CFV      Full           Yes      Yes                                 +---------+---------------+---------+-----------+----------+--------------+ SFJ      Full                                                        +---------+---------------+---------+-----------+----------+--------------+ FV Prox  Full                                                         +---------+---------------+---------+-----------+----------+--------------+ FV Mid   Full                                                        +---------+---------------+---------+-----------+----------+--------------+ FV DistalFull                                                        +---------+---------------+---------+-----------+----------+--------------+ PFV  Full                                                        +---------+---------------+---------+-----------+----------+--------------+ POP      Full           Yes      Yes                                 +---------+---------------+---------+-----------+----------+--------------+ PTV      Full                                                        +---------+---------------+---------+-----------+----------+--------------+ PERO     Full                                                        +---------+---------------+---------+-----------+----------+--------------+   +---------+---------------+---------+-----------+----------+--------------+ LEFT     CompressibilityPhasicitySpontaneityPropertiesThrombus Aging +---------+---------------+---------+-----------+----------+--------------+ CFV      Full           Yes      Yes                                 +---------+---------------+---------+-----------+----------+--------------+ SFJ      Full                                                        +---------+---------------+---------+-----------+----------+--------------+ FV Prox  Full                                                        +---------+---------------+---------+-----------+----------+--------------+ FV Mid   Full                                                        +---------+---------------+---------+-----------+----------+--------------+ FV DistalFull                                                         +---------+---------------+---------+-----------+----------+--------------+ PFV      Full                                                        +---------+---------------+---------+-----------+----------+--------------+  POP      Full           Yes      Yes                                 +---------+---------------+---------+-----------+----------+--------------+ PTV      Full                                                        +---------+---------------+---------+-----------+----------+--------------+ PERO     Full                                                        +---------+---------------+---------+-----------+----------+--------------+     Summary: RIGHT: - There is no evidence of deep vein thrombosis in the lower extremity.  - No cystic structure found in the popliteal fossa.  LEFT: - There is no evidence of deep vein thrombosis in the lower extremity.  - No cystic structure found in the popliteal fossa.  *See table(s) above for measurements and observations. Electronically signed by Deitra Mayo MD on 05/21/2021 at 2:01:37 PM.    Final     Impression and Plan Decompensated cirrhosis WBC 10.4 HGB 7.1 MCV 95.7 Platelets 131 AST 54 ALT 21 Alkphos 267 TBili 5.5 GFR 45  INR 1.4 MELD-Na score: 22 at 05/22/2021  4:54 AM MELD score: 19 at 05/22/2021  4:54 AM Calculated from: Serum Creatinine: 1.28 mg/dL at 05/22/2021  4:54 AM Serum Sodium: 133 mmol/L at 05/22/2021  4:54 AM Total Bilirubin: 5.5 mg/dL at 05/21/2021  4:47 AM INR(ratio): 1.4 at 05/20/2021 12:01 PM Age: 69 years Get labs to evaluate allergy, possibly due to prolonged history of alcohol use 1-2 beers per day over 30 years.  Also get PCR hepatitis C with genotype since patient had indeterminate hepatitis C antibody.  Ascites: No SBP on analysis, likely from cirrhosis. Pending cell cytology Will start furosemide 40 mg daily along with spironolactone 50 mg   No evidence of Hepatic Encephalopathy:   13  Patient has never had EGD No evidence of melena/hematochezia/hematemesis. Can set up outpatient.      LOS: 2 days   Vladimir Crofts  PA-C 05/22/2021, 8:32 AM  Contact #  [phone]

## 2021-05-22 NOTE — Consult Note (Addendum)
Cardiology Consultation:   Patient ID: Hannah Blake MRN: [id number]; DOB: April 06, 1951  Admit date: 05/20/2021 Date of Consult: 05/22/2021  PCP:  Oneita Hurt No   [facility] Providers Cardiologist:  Little Ishikawa, MD      (NEW)     Patient Profile:   Hannah Blake is a 70 y.o. female with a hx of Bell's palsy, tobacco and alcohol abuse who is being seen 05/22/2021 for the evaluation of CHF at the request of Dr. Caleb Popp.   Patient was diagnosed with Bell's palsy August 2022 at [facility]. She was seen in ER 10/12 with abdominal swelling and unable to urinate.  She was diagnosed with urinary retention.  She was placed on Foley catheter and discharged with plan to follow-up with urology in outpatient setting.  Also found to have liver cirrhosis with significant ascites on CT of abdomen pelvis.  Patient preferred outpatient GI evaluation.  History of Present Illness:   Ms. Ivancic presented 10/31 with worsening abdominal swelling, lower extremity edema and gross hematuria in Foley bag.  In ER, patient underwent paracentesis with 2 L of fluid removal.  Urine drug screen positive for pyuria.  Hemoglobin 9.0.  Albumin less than 1.5.  Creatinine of 1.8.  Patient was admitted and given gentle hydration initially.  He was also seen by urology.  The patient was given IV Lasix yesterday and today. Scr improved to 1.8>>1.41>>1.17>>1.28 today.  Patient was seen by GI this morning and started on p.o. Lasix 40 mg daily and spironolactone 50 mg daily.  Her breathing has improved since admitted.  Net INO -2.8 L.  Family member at bedside.  History obtained from reviewing chart and interpreter.  Patient reports longstanding tobacco smoking.  She used to smoke approximately 1 pack/day but recently cut back to about 1/4 pack/day.  She drinks alcohol.  Unable to tell how much.  Denies chest pain, palpitation, dizziness, syncope or melena.  Echo 05/21/2021 1. LVEF is 45 to 50% with hypokinesis/akinesis of the distal   inferior,distal lateral,distal inferoseptal walls . Left ventricular  ejection fraction, by estimation, is 40 to 45%. The left ventricle has  mildly decreased function. Indeterminate diastolic  filling due to E-A fusion.   2. Right ventricular systolic function is normal. The right ventricular  size is normal.   3. Left atrial size was mildly dilated.   4. The mitral valve is normal in structure. Trivial mitral valve  regurgitation.   5. The aortic valve is normal in structure. Aortic valve regurgitation is  mild.   Past Medical History:  Diagnosis Date   Bell's palsy     Past Surgical History:  Procedure Laterality Date   CLAVICLE SURGERY     FRACTURE SURGERY       Inpatient Medications: Scheduled Meds:  (feeding supplement) PROSource Plus  30 mL Oral BID BM   Chlorhexidine Gluconate Cloth  6 each Topical Daily   feeding supplement  237 mL Oral BID BM   furosemide  40 mg Oral Daily   multivitamin with minerals  1 tablet Oral Daily   spironolactone  50 mg Oral Daily   Continuous Infusions:  cefTRIAXone (ROCEPHIN)  IV 1 g (05/21/21 1646)   PRN Meds: alum & mag hydroxide-simeth, melatonin, polyethylene glycol, prochlorperazine  Allergies:   No Known Allergies  Social History:   Social History   Socioeconomic History   Marital status: Single    Spouse name: Not on file   Number of children: Not on file  Years of education: Not on file   Highest education level: Not on file  Occupational History   Not on file  Tobacco Use   Smoking status: Every Day    Types: Cigarettes   Smokeless tobacco: Never  Vaping Use   Vaping Use: Never used  Substance and Sexual Activity   Alcohol use: Not Currently    Comment: weekly   Drug use: Never   Sexual activity: Not on file  Other Topics Concern   Not on file  Social History Narrative   Not on file   Social Determinants of Health   Financial Resource Strain: Not on file  Food Insecurity: Not on file   Transportation Needs: Not on file  Physical Activity: Not on file  Stress: Not on file  Social Connections: Not on file  Intimate Partner Violence: Not on file    Family History:   Patient denies family history of CAD  ROS:  Please see the history of present illness.  All other ROS reviewed and negative.     Physical Exam/Data:   Vitals:   05/21/21 2330 05/22/21 0330 05/22/21 0335 05/22/21 0736  BP: 98/62 (!) 95/47  101/64  Pulse: 98 89  97  Resp: $Remo'16 16  18  'BXkmd$ Temp: 98.1 F (36.7 C) (!) 97.4 F (36.3 C)  98.1 F (36.7 C)  TempSrc: Oral Oral  Oral  SpO2: 97% 97%  97%  Weight:   56.5 kg   Height:        Intake/Output Summary (Last 24 hours) at 05/22/2021 1245 Last data filed at 05/22/2021 0853 Gross per 24 hour  Intake 121.73 ml  Output 2400 ml  Net -2278.27 ml   Last 3 Weights 05/22/2021 05/21/2021 05/20/2021  Weight (lbs) 124 lb 9 oz 125 lb 125 lb  Weight (kg) 56.5 kg 56.7 kg 56.7 kg     Body mass index is 22.06 kg/m.  General:  Thin frail female in no acute distress HEENT: normal Neck: no JVD Vascular: No carotid bruits; Distal pulses 2+ bilaterally Cardiac:  normal S1, S2; RRR; no murmur  Lungs:  clear to auscultation bilaterally, no wheezing, rhonchi or rales  Abd: soft, nontender, no hepatomegaly  Ext: Trace to 1 +edema Musculoskeletal:  No deformities, BUE and BLE strength normal and equal Skin: warm and dry  Neuro: no focal abnormalities noted, bell's palsy Psych:  Normal affect   EKG:  The EKG was personally reviewed and demonstrates:  SR, TWI in lead I and aVL Telemetry:  Telemetry was personally reviewed and demonstrates:  Not on tele   Relevant CV Studies: As above  Laboratory Data:  High Sensitivity Troponin:  No results for input(s): TROPONINIHS in the last 720 hours.   Chemistry Recent Labs  Lab 05/20/21 1201 05/20/21 2220 05/21/21 0447 05/22/21 0454  NA 135  --  137 133*  K 3.9  --  3.9 3.3*  CL 106  --  110 106  CO2 20*  --  21* 22   GLUCOSE 108*  --  104* 94  BUN 28*  --  29* 25*  CREATININE 1.80* 1.41* 1.17* 1.28*  CALCIUM 7.7*  --  7.6* 7.3*  MG  --  2.2 2.2  --   GFRNONAA 30* 40* 50* 45*  ANIONGAP 9  --  6 5    Recent Labs  Lab 05/20/21 1201 05/21/21 0447  PROT 7.7 6.6  ALBUMIN <1.5* <1.5*  AST 72* 54*  ALT 24 21  ALKPHOS 323* 267*  BILITOT  6.0* 5.5*   Lipids No results for input(s): CHOL, TRIG, HDL, LABVLDL, LDLCALC, CHOLHDL in the last 168 hours.  Hematology Recent Labs  Lab 05/20/21 2220 05/21/21 0447 05/22/21 0454  WBC 13.6* 13.5* 10.4  RBC 2.73* 2.49* 2.31*  HGB 8.6* 7.8* 7.1*  HCT 27.0* 24.4* 22.1*  MCV 98.9 98.0 95.7  MCH 31.5 31.3 30.7  MCHC 31.9 32.0 32.1  RDW 18.8* 18.6* 18.7*  PLT 158 148* 131*   Thyroid No results for input(s): TSH, FREET4 in the last 168 hours.  BNP Recent Labs  Lab 05/20/21 2220  BNP 689.8*    DDimer No results for input(s): DDIMER in the last 168 hours.   Radiology/Studies:  DG Chest Port 1 View  Result Date: 05/20/2021 CLINICAL DATA:  Acute kidney injury, hematuria, lower extremity swelling EXAM: PORTABLE CHEST 1 VIEW COMPARISON:  None. FINDINGS: Low lung volumes. Mild enlargement of the cardiopericardial silhouette. Atherosclerotic aortic arch. Otherwise normal mediastinal contour. No pneumothorax. No pleural effusion. Moderate eventration of the right hemidiaphragm. No overt pulmonary edema. Streaky bibasilar lung opacities compatible with atelectasis. IMPRESSION: 1. Low lung volumes with streaky bibasilar lung opacities compatible with atelectasis. Moderate eventration of the right hemidiaphragm. 2. Mild enlargement of the cardiopericardial silhouette without overt pulmonary edema. Electronically Signed   By: Ilona Sorrel M.D.   On: 05/20/2021 14:39   ECHOCARDIOGRAM COMPLETE  Result Date: 05/21/2021    ECHOCARDIOGRAM REPORT   Patient Name:   MARKEISHA MANCIAS [facility] Date of Exam: 05/21/2021 Medical Rec #:  [id number]  Height:       63.0 in Accession #:    [id number]  Weight:       125.0 lb Date of Birth:  [birth year]  BSA:          1.584 m Patient Age:    36 years   BP:           104/63 mmHg Patient Gender: F          HR:           99 bpm. Exam Location:  Inpatient Procedure: 2D Echo, Cardiac Doppler and Color Doppler Indications:    Abnormal ECG R94.31  History:        Patient has no prior history of Echocardiogram examinations.                 Arrythmias:Prolonged QTC. Suspected cirrhosis with large ascites                 status post paracentesis. Severe bilateral lower extremity                 edema.  Sonographer:    Darlina Sicilian RDCS Referring Phys: [id number] [place]  1. LVEF is 45 to 50% with hypokinesis/akinesis of the distal inferior,distal lateral,distal inferoseptal walls . Left ventricular ejection fraction, by estimation, is 40 to 45%. The left ventricle has mildly decreased function. Indeterminate diastolic filling due to E-A fusion.  2. Right ventricular systolic function is normal. The right ventricular size is normal.  3. Left atrial size was mildly dilated.  4. The mitral valve is normal in structure. Trivial mitral valve regurgitation.  5. The aortic valve is normal in structure. Aortic valve regurgitation is mild. FINDINGS  Left Ventricle: LVEF is 45 to 50% with hypokinesis/akinesis of the distal inferior,distal lateral,distal inferoseptal walls. Left ventricular ejection fraction, by estimation, is 40 to 45%. The left ventricle has mildly decreased function. The left ventricular internal cavity size was normal in  size. There is no left ventricular hypertrophy. Indeterminate diastolic filling due to E-A fusion. Right Ventricle: The right ventricular size is normal. Right vetricular wall thickness was not assessed. Right ventricular systolic function is normal. Left Atrium: Left atrial size was mildly dilated. Right Atrium: Right atrial size was normal in size. Pericardium: Trivial pericardial effusion is present. Mitral Valve: The mitral  valve is normal in structure. Trivial mitral valve regurgitation. Tricuspid Valve: The tricuspid valve is normal in structure. Tricuspid valve regurgitation is trivial. Aortic Valve: The aortic valve is normal in structure. Aortic valve regurgitation is mild. Aortic regurgitation PHT measures 453 msec. Pulmonic Valve: The pulmonic valve was normal in structure. Pulmonic valve regurgitation is not visualized. Aorta: The aortic root and ascending aorta are structurally normal, with no evidence of dilitation. IAS/Shunts: No atrial level shunt detected by color flow Doppler.  LEFT VENTRICLE PLAX 2D LVIDd:         4.00 cm LVIDs:         2.90 cm LV PW:         0.60 cm LV IVS:        1.00 cm LVOT diam:     2.10 cm LV SV:         53 LV SV Index:   34 LVOT Area:     3.46 cm  RIGHT VENTRICLE RV S prime:     15.50 cm/s TAPSE (M-mode): 1.8 cm LEFT ATRIUM             Index        RIGHT ATRIUM          Index LA diam:        2.90 cm 1.83 cm/m   RA Area:     6.11 cm LA Vol (A2C):   19.4 ml 12.25 ml/m  RA Volume:   8.33 ml  5.26 ml/m LA Vol (A4C):   21.9 ml 13.83 ml/m LA Biplane Vol: 22.6 ml 14.27 ml/m  AORTIC VALVE LVOT Vmax:   83.90 cm/s LVOT Vmean:  57.400 cm/s LVOT VTI:    0.154 m AI PHT:      453 msec  AORTA Ao Root diam: 3.50 cm Ao STJ diam:  2.6 cm Ao Asc diam:  3.70 cm  SHUNTS Systemic VTI:  0.15 m Systemic Diam: 2.10 cm Dorris Carnes MD Electronically signed by Dorris Carnes MD Signature Date/Time: 05/21/2021/1:27:00 PM    Final    VAS [place] LOWER EXTREMITY VENOUS (DVT)  Result Date: 05/21/2021  Lower Venous DVT Study Patient Name:  SHAKETA SERAFIN [facility]  Date of Exam:   05/21/2021 Medical Rec #: [id number]   Accession #:    [id number] Date of Birth: Dec 20, 1950   Patient Gender: F Patient Age:   51 years Exam Location:  [facility] Procedure:      VAS [place] LOWER EXTREMITY VENOUS (DVT) Referring Phys: Archie Patten HALL --------------------------------------------------------------------------------  Indications: Edema.  Risk Factors:  None identified. Limitations: Poor ultrasound/tissue interface. Comparison Study: No prior studies. Performing Technologist: Oliver Hum RVT  Examination Guidelines: A complete evaluation includes B-mode imaging, spectral Doppler, color Doppler, and power Doppler as needed of all accessible portions of each vessel. Bilateral testing is considered an integral part of a complete examination. Limited examinations for reoccurring indications may be performed as noted. The reflux portion of the exam is performed with the patient in reverse Trendelenburg.  +---------+---------------+---------+-----------+----------+--------------+ RIGHT    CompressibilityPhasicitySpontaneityPropertiesThrombus Aging +---------+---------------+---------+-----------+----------+--------------+ CFV      Full  Yes      Yes                                 +---------+---------------+---------+-----------+----------+--------------+ SFJ      Full                                                        +---------+---------------+---------+-----------+----------+--------------+ FV Prox  Full                                                        +---------+---------------+---------+-----------+----------+--------------+ FV Mid   Full                                                        +---------+---------------+---------+-----------+----------+--------------+ FV DistalFull                                                        +---------+---------------+---------+-----------+----------+--------------+ PFV      Full                                                        +---------+---------------+---------+-----------+----------+--------------+ POP      Full           Yes      Yes                                 +---------+---------------+---------+-----------+----------+--------------+ PTV      Full                                                         +---------+---------------+---------+-----------+----------+--------------+ PERO     Full                                                        +---------+---------------+---------+-----------+----------+--------------+   +---------+---------------+---------+-----------+----------+--------------+ LEFT     CompressibilityPhasicitySpontaneityPropertiesThrombus Aging +---------+---------------+---------+-----------+----------+--------------+ CFV      Full           Yes      Yes                                 +---------+---------------+---------+-----------+----------+--------------+ SFJ  Full                                                        +---------+---------------+---------+-----------+----------+--------------+ FV Prox  Full                                                        +---------+---------------+---------+-----------+----------+--------------+ FV Mid   Full                                                        +---------+---------------+---------+-----------+----------+--------------+ FV DistalFull                                                        +---------+---------------+---------+-----------+----------+--------------+ PFV      Full                                                        +---------+---------------+---------+-----------+----------+--------------+ POP      Full           Yes      Yes                                 +---------+---------------+---------+-----------+----------+--------------+ PTV      Full                                                        +---------+---------------+---------+-----------+----------+--------------+ PERO     Full                                                        +---------+---------------+---------+-----------+----------+--------------+     Summary: RIGHT: - There is no evidence of deep vein thrombosis in the lower extremity.  - No cystic structure found in  the popliteal fossa.  LEFT: - There is no evidence of deep vein thrombosis in the lower extremity.  - No cystic structure found in the popliteal fossa.  *See table(s) above for measurements and observations. Electronically signed by Deitra Mayo MD on 05/21/2021 at 2:01:37 PM.    Final      Assessment and Plan:   Acute combined CHF - BNP 698 -Echocardiogram showed LV function of 40 to 45% with hypokinesis/akinesis of the distal inferior, distal lateral and distal inferior septal walls.  Intermediate diastolic filling  pressure.  Normal RV function. -Patient underwent paracentesis with 2 L fluid removal -Initially treated with fluids and then IV diuresis with 2.8 L negative I& O -Now on Lasix 40 mg daily and spironolactone 50 mg daily - EKG with TWI in lead I and aVL -Review meds and plan with MD  2.  Liver cirrhosis with ascites -Per GI  3. Anemia - Results for BIJOU, EASLER (MRN [id number]) as of 05/22/2021 13:35  Ref. Range 05/01/2021 15:10 05/20/2021 12:01 05/20/2021 22:20 05/21/2021 04:47 05/22/2021 04:54  Hemoglobin Latest Ref Range: 12.0 - 15.0 g/dL 8.5 (L) 9.0 (L) 8.6 (L) 7.8 (L) 7.1 (L)  - Per Primary    Dr. Alverda Skeans to see.     For questions or updates, please contact [place] Please consult [email or web address] for contact info under    Jarrett Soho, PA  05/22/2021 12:45 PM   Patient seen and examined.  Agree with above documentation.  Ms. Langenderfer is a 70 year old female with a history of Bell's palsy, tobacco use, alcohol use who we are consulted by Dr. Lonny Prude for evaluation of heart failure.  She presented to the ED 10/12 with inability to urinate and abdominal distention.  Foley catheter was placed and she was discharged with urology follow-up.  CT abdomen pelvis concerning for liver cirrhosis.  She was referred to GI as an outpatient.  Subsequently presented 10/31 with worsening abdominal and lower extremity swelling.  Also having hematuria.  Labs are  notable for albumin less than 1.5, AST 72, ALT 24, alk phos 323, creatinine 1.8 (increased from 0.77 on 10/12), WBC 13.6, hemoglobin 9.0, BNP 690, lactate 1.4, platelets 184, INR 1.4.  EKG shows sinus rhythm, rate 97, T wave inversions in leads I, aVL.  Chest x-ray showed atelectasis, enlargement of cardiopericardial silhouette without overt pulmonary edema.  She underwent paracentesis with 2 L of fluid removed.  Renal function improved with diuresis.  She was seen by GI and switched to p.o. Lasix 40 mg daily and spironolactone 50 mg daily today.  Echocardiogram showed EF 45 to 50% with hypokinesis/akinesis of distal inferior/inferoseptal/lateral wall, normal RV function, mild AI.  On exam, patient is alert and oriented, regular rate and rhythm, no murmurs, lungs CTAB, trace LE edema.  For her volume overload, suspect multifactorial with cirrhosis and heart failure contributing.  She has diuresed well and currently appears fairly euvolemic.  Continue p.o. diuretics per GI.  For her systolic heart failure, there are regional wall motion abnormalities, concerning for ischemic cardiomyopathy.  She is not a good candidate for cardiac catheterization at this time given her anemia.  In addition, she denies any chest pain.  Recommend medical management for now, can consider ischemic evaluation as outpatient once anemia improved.  GI planning outpatient EGD/colonoscopy.  Given her soft BP, will hold off on further GDMT at this time, will add as tolerated.  Donato Heinz, MD

## 2021-05-23 ENCOUNTER — Encounter (HOSPITAL_COMMUNITY): Payer: Self-pay | Admitting: Internal Medicine

## 2021-05-23 ENCOUNTER — Inpatient Hospital Stay (HOSPITAL_COMMUNITY): Payer: Medicare Other

## 2021-05-23 LAB — CBC
HCT: 21.9 % — ABNORMAL LOW (ref 36.0–46.0)
Hemoglobin: 7.1 g/dL — ABNORMAL LOW (ref 12.0–15.0)
MCH: 31.7 pg (ref 26.0–34.0)
MCHC: 32.4 g/dL (ref 30.0–36.0)
MCV: 97.8 fL (ref 80.0–100.0)
Platelets: 131 10*3/uL — ABNORMAL LOW (ref 150–400)
RBC: 2.24 MIL/uL — ABNORMAL LOW (ref 3.87–5.11)
RDW: 19.2 % — ABNORMAL HIGH (ref 11.5–15.5)
WBC: 9.6 10*3/uL (ref 4.0–10.5)
nRBC: 0 % (ref 0.0–0.2)

## 2021-05-23 LAB — ENA+DNA/DS+ANTICH+CENTRO+JO...
Anti JO-1: <0.2 AI (ref 0.0–0.9)
Centromere Ab Screen: <0.2 AI (ref 0.0–0.9)
Chromatin Ab SerPl-aCnc: 0.2 AI (ref 0.0–0.9)
ENA SM Ab Ser-aCnc: <0.2 AI (ref 0.0–0.9)
Ribonucleic Protein: 2.2 AI — ABNORMAL HIGH (ref 0.0–0.9)
SSA (Ro) (ENA) Antibody, IgG: <0.2 AI (ref 0.0–0.9)
SSB (La) (ENA) Antibody, IgG: <0.2 AI (ref 0.0–0.9)
Scleroderma (Scl-70) (ENA) Antibody, IgG: <0.2 AI (ref 0.0–0.9)
ds DNA Ab: 1 IU/mL (ref 0–9)

## 2021-05-23 LAB — BASIC METABOLIC PANEL
Anion gap: 7 (ref 5–15)
BUN: 23 mg/dL (ref 8–23)
CO2: 25 mmol/L (ref 22–32)
Calcium: 7.4 mg/dL — ABNORMAL LOW (ref 8.9–10.3)
Chloride: 101 mmol/L (ref 98–111)
Creatinine, Ser: 1.08 mg/dL — ABNORMAL HIGH (ref 0.44–1.00)
GFR, Estimated: 55 mL/min — ABNORMAL LOW (ref >60)
Glucose, Bld: 99 mg/dL (ref 70–99)
Potassium: 3 mmol/L — ABNORMAL LOW (ref 3.5–5.1)
Sodium: 133 mmol/L — ABNORMAL LOW (ref 135–145)

## 2021-05-23 LAB — MITOCHONDRIAL ANTIBODIES: Mitochondrial M2 Ab, IgG: 105.8 Units — ABNORMAL HIGH (ref 0.0–20.0)

## 2021-05-23 LAB — HCV INTERPRETATION

## 2021-05-23 LAB — ANTI-SMOOTH MUSCLE ANTIBODY, IGG: F-Actin IgG: 22 Units — ABNORMAL HIGH (ref 0–19)

## 2021-05-23 LAB — HCV AB W REFLEX TO QUANT PCR: HCV Ab: 0.1 s/co ratio (ref 0.0–0.9)

## 2021-05-23 LAB — ANA W/REFLEX IF POSITIVE: Anti Nuclear Antibody (ANA): POSITIVE — AB

## 2021-05-23 MED ORDER — POTASSIUM CHLORIDE CRYS ER 20 MEQ PO TBCR: 20.0000 meq | EXTENDED_RELEASE_TABLET | Freq: Every day | ORAL | 0 refills | Status: DC

## 2021-05-23 MED ORDER — FUROSEMIDE 40 MG PO TABS: 40.0000 mg | ORAL_TABLET | Freq: Every day | ORAL | Status: DC

## 2021-05-23 MED ORDER — SPIRONOLACTONE 50 MG PO TABS: 50.0000 mg | ORAL_TABLET | Freq: Every day | ORAL | 0 refills | Status: DC

## 2021-05-23 MED ORDER — POTASSIUM CHLORIDE CRYS ER 20 MEQ PO TBCR
40.0000 meq | EXTENDED_RELEASE_TABLET | Freq: Once | ORAL | Status: AC
  Administered 2021-05-23: 40 meq via ORAL
  Filled 2021-05-23: qty 2

## 2021-05-23 MED ORDER — CEPHALEXIN 500 MG PO CAPS: 500.0000 mg | ORAL_CAPSULE | Freq: Three times a day (TID) | ORAL | 0 refills | Status: AC

## 2021-05-23 NOTE — Progress Notes (Addendum)
Progress Note  Patient Name: Hannah Blake Date of Encounter: 05/23/2021  [facility] Cardiologist: Donato Heinz, MD   Subjective   [place] interpreter used Pt says breathing well, at home she moves slowly around the house. Has been up to BR but that is all.   Inpatient Medications    Scheduled Meds:  (feeding supplement) PROSource Plus  30 mL Oral BID BM   Chlorhexidine Gluconate Cloth  6 each Topical Daily   feeding supplement  237 mL Oral BID BM   furosemide  40 mg Oral Daily   multivitamin with minerals  1 tablet Oral Daily   spironolactone  50 mg Oral Daily   Continuous Infusions:   ceFAZolin (ANCEF) IV     PRN Meds: alum & mag hydroxide-simeth, melatonin, polyethylene glycol, prochlorperazine   Vital Signs    Vitals:   05/22/21 1310 05/22/21 1939 05/23/21 0132 05/23/21 0353  BP: (!) 96/55 (!) 91/57  (!) 93/54  Pulse: 88 92  99  Resp: 20 18  18   Temp: 97.8 F (36.6 C) 98 F (36.7 C)  99 F (37.2 C)  TempSrc: Oral Axillary  Oral  SpO2: 97% 99%  98%  Weight:  59.1 kg 59.1 kg   Height:        Intake/Output Summary (Last 24 hours) at 05/23/2021 1038 Last data filed at 05/23/2021 [id number] Gross per 24 hour  Intake 716 ml  Output 3600 ml  Net -2884 ml   Last 3 Weights 05/23/2021 05/22/2021 05/22/2021  Weight (lbs) 130 lb 4.7 oz 130 lb 4.7 oz 124 lb 9 oz  Weight (kg) 59.1 kg 59.1 kg 56.5 kg      Telemetry    Not on - Personally Reviewed  ECG    None today - Personally Reviewed  Physical Exam   GEN:  Frail, elderly female, No acute distress.   Neck: JVD to jaw w/ respirophasic variations Cardiac: RRR, no murmurs, rubs, or gallops.  Respiratory: rales bases bilaterally. GI: firm on R, nontender, +distended, +liver enlargement MS: trace LE edema; No deformity. Neuro:  Nonfocal  Psych: Normal affect   Labs    High Sensitivity Troponin:  No results for input(s): TROPONINIHS in the last 720 hours.   Chemistry Recent Labs  Lab  05/20/21 1201 05/20/21 2220 05/21/21 0447 05/22/21 0454 05/23/21 0459  NA 135  --  137 133* 133*  K 3.9  --  3.9 3.3* 3.0*  CL 106  --  110 106 101  CO2 20*  --  21* 22 25  GLUCOSE 108*  --  104* 94 99  BUN 28*  --  29* 25* 23  CREATININE 1.80* 1.41* 1.17* 1.28* 1.08*  CALCIUM 7.7*  --  7.6* 7.3* 7.4*  MG  --  2.2 2.2  --   --   PROT 7.7  --  6.6  --   --   ALBUMIN <1.5*  --  <1.5*  --   --   AST 72*  --  54*  --   --   ALT 24  --  21  --   --   ALKPHOS 323*  --  267*  --   --   BILITOT 6.0*  --  5.5*  --   --   GFRNONAA 30* 40* 50* 45* 55*  ANIONGAP 9  --  6 5 7     Lipids No results for input(s): CHOL, TRIG, HDL, LABVLDL, LDLCALC, CHOLHDL in the last 168 hours.  Hematology Recent  Labs  Lab 05/21/21 0447 05/22/21 0454 05/23/21 0459  WBC 13.5* 10.4 9.6  RBC 2.49* 2.31* 2.24*  HGB 7.8* 7.1* 7.1*  HCT 24.4* 22.1* 21.9*  MCV 98.0 95.7 97.8  MCH 31.3 30.7 31.7  MCHC 32.0 32.1 32.4  RDW 18.6* 18.7* 19.2*  PLT 148* 131* 131*   Thyroid No results for input(s): TSH, FREET4 in the last 168 hours.  BNP Recent Labs  Lab 05/20/21 2220  BNP 689.8*    DDimer No results for input(s): DDIMER in the last 168 hours.   Urinalysis    Component Value Date/Time   COLORURINE RED (A) 05/20/2021 1201   APPEARANCEUR TURBID (A) 05/20/2021 1201   LABSPEC  05/20/2021 1201    TEST NOT REPORTED DUE TO COLOR INTERFERENCE OF URINE PIGMENT   PHURINE  05/20/2021 1201    TEST NOT REPORTED DUE TO COLOR INTERFERENCE OF URINE PIGMENT   GLUCOSEU (A) 05/20/2021 1201    TEST NOT REPORTED DUE TO COLOR INTERFERENCE OF URINE PIGMENT   HGBUR (A) 05/20/2021 1201    TEST NOT REPORTED DUE TO COLOR INTERFERENCE OF URINE PIGMENT   BILIRUBINUR (A) 05/20/2021 1201    TEST NOT REPORTED DUE TO COLOR INTERFERENCE OF URINE PIGMENT   KETONESUR (A) 05/20/2021 1201    TEST NOT REPORTED DUE TO COLOR INTERFERENCE OF URINE PIGMENT   PROTEINUR (A) 05/20/2021 1201    TEST NOT REPORTED DUE TO COLOR INTERFERENCE OF  URINE PIGMENT   NITRITE (A) 05/20/2021 1201    TEST NOT REPORTED DUE TO COLOR INTERFERENCE OF URINE PIGMENT   LEUKOCYTESUR (A) 05/20/2021 1201    TEST NOT REPORTED DUE TO COLOR INTERFERENCE OF URINE PIGMENT   Radiology    ECHOCARDIOGRAM COMPLETE  Result Date: 05/21/2021    ECHOCARDIOGRAM REPORT   Patient Name:   Hannah Blake [facility] Date of Exam: 05/21/2021 Medical Rec #:  [id number]  Height:       63.0 in Accession #:    [id number] Weight:       125.0 lb Date of Birth:  1951/01/31  BSA:          1.584 m Patient Age:    70 years   BP:           104/63 mmHg Patient Gender: F          HR:           99 bpm. Exam Location:  Inpatient Procedure: 2D Echo, Cardiac Doppler and Color Doppler Indications:    Abnormal ECG R94.31  History:        Patient has no prior history of Echocardiogram examinations.                 Arrythmias:Prolonged QTC. Suspected cirrhosis with large ascites                 status post paracentesis. Severe bilateral lower extremity                 edema.  Sonographer:    Leta Jungling RDCS Referring Phys: [id number] CAROLE N HALL IMPRESSIONS  1. LVEF is 45 to 50% with hypokinesis/akinesis of the distal inferior,distal lateral,distal inferoseptal walls . Left ventricular ejection fraction, by estimation, is 40 to 45%. The left ventricle has mildly decreased function. Indeterminate diastolic filling due to E-A fusion.  2. Right ventricular systolic function is normal. The right ventricular size is normal.  3. Left atrial size was mildly dilated.  4. The mitral valve is normal in structure. Trivial  mitral valve regurgitation.  5. The aortic valve is normal in structure. Aortic valve regurgitation is mild. FINDINGS  Left Ventricle: LVEF is 45 to 50% with hypokinesis/akinesis of the distal inferior,distal lateral,distal inferoseptal walls. Left ventricular ejection fraction, by estimation, is 40 to 45%. The left ventricle has mildly decreased function. The left ventricular internal cavity size was normal in  size. There is no left ventricular hypertrophy. Indeterminate diastolic filling due to E-A fusion. Right Ventricle: The right ventricular size is normal. Right vetricular wall thickness was not assessed. Right ventricular systolic function is normal. Left Atrium: Left atrial size was mildly dilated. Right Atrium: Right atrial size was normal in size. Pericardium: Trivial pericardial effusion is present. Mitral Valve: The mitral valve is normal in structure. Trivial mitral valve regurgitation. Tricuspid Valve: The tricuspid valve is normal in structure. Tricuspid valve regurgitation is trivial. Aortic Valve: The aortic valve is normal in structure. Aortic valve regurgitation is mild. Aortic regurgitation PHT measures 453 msec. Pulmonic Valve: The pulmonic valve was normal in structure. Pulmonic valve regurgitation is not visualized. Aorta: The aortic root and ascending aorta are structurally normal, with no evidence of dilitation. IAS/Shunts: No atrial level shunt detected by color flow Doppler.  LEFT VENTRICLE PLAX 2D LVIDd:         4.00 cm LVIDs:         2.90 cm LV PW:         0.60 cm LV IVS:        1.00 cm LVOT diam:     2.10 cm LV SV:         53 LV SV Index:   34 LVOT Area:     3.46 cm  RIGHT VENTRICLE RV S prime:     15.50 cm/s TAPSE (M-mode): 1.8 cm LEFT ATRIUM             Index        RIGHT ATRIUM          Index LA diam:        2.90 cm 1.83 cm/m   RA Area:     6.11 cm LA Vol (A2C):   19.4 ml 12.25 ml/m  RA Volume:   8.33 ml  5.26 ml/m LA Vol (A4C):   21.9 ml 13.83 ml/m LA Biplane Vol: 22.6 ml 14.27 ml/m  AORTIC VALVE LVOT Vmax:   83.90 cm/s LVOT Vmean:  57.400 cm/s LVOT VTI:    0.154 m AI PHT:      453 msec  AORTA Ao Root diam: 3.50 cm Ao STJ diam:  2.6 cm Ao Asc diam:  3.70 cm  SHUNTS Systemic VTI:  0.15 m Systemic Diam: 2.10 cm Dorris Carnes MD Electronically signed by Dorris Carnes MD Signature Date/Time: 05/21/2021/1:27:00 PM    Final     Cardiac Studies   Echo 05/21/2021 1. LVEF is 45 to 50% with  hypokinesis/akinesis of the distal  inferior,distal lateral,distal inferoseptal walls . Left ventricular  ejection fraction, by estimation, is 40 to 45%. The left ventricle has  mildly decreased function. Indeterminate diastolic filling due to E-A fusion.   2. Right ventricular systolic function is normal. The right ventricular  size is normal.   3. Left atrial size was mildly dilated.   4. The mitral valve is normal in structure. Trivial mitral valve  regurgitation.   5. The aortic valve is normal in structure. Aortic valve regurgitation is  mild.   Patient Profile     70 y.o. female  hx of Bell's  palsy, tobacco and alcohol abuse who was admitted 10/31 with worsening abdominal swelling, lower extremity edema and gross hematuria in Foley bag.  In ER, patient underwent paracentesis with 2 L of fluid removal.   Cards saw 11/02 for CHF.  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 Ascites - s/p paracentesis w/ 2 L out in the ER -mild ascites on [place]  3. Hypokalemia - Kdur 40 meq ordered, will order another 40 meq  Otherwise, per IM  [facility] will sign off.   Medication Recommendations:  No changes Other recommendations (labs, testing, etc):  BMET within 1 week Follow up as an outpatient: We will schedule follow-up in [facility]    For questions or updates, please contact [place] Please consult [email or web address] for contact info under        Signed, Rosaria Ferries, PA-C  05/23/2021, 10:38 AM    Patient seen and examined.  Agree with above documentation.  On exam, patient is alert and oriented, regular rate and rhythm, no murmurs, lungs CTAB, no LE edema.  Volume status appears improved, continue diuretics per GI.  BP remains soft, no room to add GDMT.  Hemoglobin remains low.  She denies any chest pain.  Recommend outpatient follow-up (she prefers to  follow-up in [facility], will arrange).  Given regional wall motion abnormalities on echo, can consider ischemic evaluation once hemoglobin improved.  Can fold in GDMT as BP tolerates.  Donato Heinz, MD

## 2021-05-23 NOTE — Discharge Summary (Addendum)
1  Physician Discharge Summary  Hannah Blake:[id number] DOB: 1951-01-17 DOA: 05/20/2021  PCP: Oneita Hurt, No  Admit date: 05/20/2021 Discharge date: 05/23/2021  Discharge disposition: Home   Recommendations for Outpatient Follow-Up:   Follow-up with PCP within 1 week of discharge Follow-up with gastroenterologist as scheduled on June 26, 2021 Outpatient follow-up with cardiologist (office will call to schedule) Follow-up with urologist for voiding trial.   Discharge Diagnosis:   Principal Problem:   AKI (acute kidney injury) (HCC) Active Problems:   Protein-calorie malnutrition, severe   Cirrhosis of liver with ascites (HCC)   UTI (urinary tract infection) due to urinary indwelling Foley catheter (HCC)   Acute urinary retention   Acute systolic heart failure (HCC)    Discharge Condition: Stable.  Diet recommendation:  Diet Order             Diet - low sodium heart healthy           Diet 2 gram sodium Room service appropriate? Yes; Fluid consistency: Thin; Fluid restriction: 1500 mL Fluid  Diet effective now                     Code Status: Full Code     Hospital Course:   Ms. [name] is a 70 year old woman with medical history significant for Bell's palsy diagnosed in August 2022, recent placement of Foley catheter for urinary retention (about 3 weeks prior to admission), who presented to the hospital because of gross hematuria, abdominal distention and bilateral lower extremity edema.  Recent CT abdomen pelvis on 05/01/2021 showed liver cirrhosis, cholelithiasis and ascites.  Foley catheter was replaced in the ED and 300 cc of urine was removed.  She was admitted to the hospital for AKI, catheter associated UTI, liver cirrhosis complicated by fluid overload including ascites.  She underwent paracentesis in the ED.  There was no evidence of SBP.  Initially, she was given IV fluids for volume depletion in the ED.  She was treated with empiric IV antibiotics, IV  Lasix.  She also had hypokalemia that was repleted.  2D echo showed EF estimated at 45 to 50% and indeterminate diastolic dysfunction.  Probably acute on chronic systolic CHF contributed to fluid overload.  Cardiology was consulted and outpatient follow-up for ischemic work-up was recommended.  Discharge plan was discussed with patient and her grandson, Casimiro Needle, at the bedside.  Medical Consultants:   Urologist Cardiologist Gastroenterologist   Discharge Exam:    Vitals:   05/22/21 1310 05/22/21 1939 05/23/21 0132 05/23/21 0353  BP: (!) 96/55 (!) 91/57  (!) 93/54  Pulse: 88 92  99  Resp: 20 18  18   Temp: 97.8 F (36.6 C) 98 F (36.7 C)  99 F (37.2 C)  TempSrc: Oral Axillary  Oral  SpO2: 97% 99%  98%  Weight:  59.1 kg 59.1 kg   Height:         GEN: NAD SKIN: Warm and dry EYES: No pallor or icterus ENT: MMM CV: RRR PULM: CTA B ABD: soft, ND, NT, +BS CNS: AAO x 3, left-sided Bell's palsy EXT: No edema or tenderness   The results of significant diagnostics from this hospitalization (including imaging, microbiology, ancillary and laboratory) are listed below for reference.     Procedures and Diagnostic Studies:   DG Chest Port 1 View  Result Date: 05/20/2021 CLINICAL DATA:  Acute kidney injury, hematuria, lower extremity swelling EXAM: PORTABLE CHEST 1 VIEW COMPARISON:  None. FINDINGS: Low lung volumes. Mild enlargement  of the cardiopericardial silhouette. Atherosclerotic aortic arch. Otherwise normal mediastinal contour. No pneumothorax. No pleural effusion. Moderate eventration of the right hemidiaphragm. No overt pulmonary edema. Streaky bibasilar lung opacities compatible with atelectasis. IMPRESSION: 1. Low lung volumes with streaky bibasilar lung opacities compatible with atelectasis. Moderate eventration of the right hemidiaphragm. 2. Mild enlargement of the cardiopericardial silhouette without overt pulmonary edema. Electronically Signed   By: Delbert Phenix M.D.    On: 05/20/2021 14:39   ECHOCARDIOGRAM COMPLETE  Result Date: 05/21/2021    ECHOCARDIOGRAM REPORT   Patient Name:   Hannah Blake [facility] Date of Exam: 05/21/2021 Medical Rec #:  [id number]  Height:       63.0 in Accession #:    [id number] Weight:       125.0 lb Date of Birth:  31-Jul-1950  BSA:          1.584 m Patient Age:    70 years   BP:           104/63 mmHg Patient Gender: F          HR:           99 bpm. Exam Location:  Inpatient Procedure: 2D Echo, Cardiac Doppler and Color Doppler Indications:    Abnormal ECG R94.31  History:        Patient has no prior history of Echocardiogram examinations.                 Arrythmias:Prolonged QTC. Suspected cirrhosis with large ascites                 status post paracentesis. Severe bilateral lower extremity                 edema.  Sonographer:    Leta Jungling RDCS Referring Phys: [id number] CAROLE N HALL IMPRESSIONS  1. LVEF is 45 to 50% with hypokinesis/akinesis of the distal inferior,distal lateral,distal inferoseptal walls . Left ventricular ejection fraction, by estimation, is 40 to 45%. The left ventricle has mildly decreased function. Indeterminate diastolic filling due to E-A fusion.  2. Right ventricular systolic function is normal. The right ventricular size is normal.  3. Left atrial size was mildly dilated.  4. The mitral valve is normal in structure. Trivial mitral valve regurgitation.  5. The aortic valve is normal in structure. Aortic valve regurgitation is mild. FINDINGS  Left Ventricle: LVEF is 45 to 50% with hypokinesis/akinesis of the distal inferior,distal lateral,distal inferoseptal walls. Left ventricular ejection fraction, by estimation, is 40 to 45%. The left ventricle has mildly decreased function. The left ventricular internal cavity size was normal in size. There is no left ventricular hypertrophy. Indeterminate diastolic filling due to E-A fusion. Right Ventricle: The right ventricular size is normal. Right vetricular wall thickness was not assessed.  Right ventricular systolic function is normal. Left Atrium: Left atrial size was mildly dilated. Right Atrium: Right atrial size was normal in size. Pericardium: Trivial pericardial effusion is present. Mitral Valve: The mitral valve is normal in structure. Trivial mitral valve regurgitation. Tricuspid Valve: The tricuspid valve is normal in structure. Tricuspid valve regurgitation is trivial. Aortic Valve: The aortic valve is normal in structure. Aortic valve regurgitation is mild. Aortic regurgitation PHT measures 453 msec. Pulmonic Valve: The pulmonic valve was normal in structure. Pulmonic valve regurgitation is not visualized. Aorta: The aortic root and ascending aorta are structurally normal, with no evidence of dilitation. IAS/Shunts: No atrial level shunt detected by color flow Doppler.  LEFT VENTRICLE PLAX 2D LVIDd:  4.00 cm LVIDs:         2.90 cm LV PW:         0.60 cm LV IVS:        1.00 cm LVOT diam:     2.10 cm LV SV:         53 LV SV Index:   34 LVOT Area:     3.46 cm  RIGHT VENTRICLE RV S prime:     15.50 cm/s TAPSE (M-mode): 1.8 cm LEFT ATRIUM             Index        RIGHT ATRIUM          Index LA diam:        2.90 cm 1.83 cm/m   RA Area:     6.11 cm LA Vol (A2C):   19.4 ml 12.25 ml/m  RA Volume:   8.33 ml  5.26 ml/m LA Vol (A4C):   21.9 ml 13.83 ml/m LA Biplane Vol: 22.6 ml 14.27 ml/m  AORTIC VALVE LVOT Vmax:   83.90 cm/s LVOT Vmean:  57.400 cm/s LVOT VTI:    0.154 m AI PHT:      453 msec  AORTA Ao Root diam: 3.50 cm Ao STJ diam:  2.6 cm Ao Asc diam:  3.70 cm  SHUNTS Systemic VTI:  0.15 m Systemic Diam: 2.10 cm Dorris Carnes MD Electronically signed by Dorris Carnes MD Signature Date/Time: 05/21/2021/1:27:00 PM    Final    VAS [place] LOWER EXTREMITY VENOUS (DVT)  Result Date: 05/21/2021  Lower Venous DVT Study Patient Name:  JANELLY FAZEL [facility]  Date of Exam:   05/21/2021 Medical Rec #: [id number]   Accession #:    [id number] Date of Birth: 31-Dec-1950   Patient Gender: F Patient Age:   40 years Exam  Location:  [facility] Procedure:      VAS [place] LOWER EXTREMITY VENOUS (DVT) Referring Phys: Archie Patten HALL --------------------------------------------------------------------------------  Indications: Edema.  Risk Factors: None identified. Limitations: Poor ultrasound/tissue interface. Comparison Study: No prior studies. Performing Technologist: Oliver Hum RVT  Examination Guidelines: A complete evaluation includes B-mode imaging, spectral Doppler, color Doppler, and power Doppler as needed of all accessible portions of each vessel. Bilateral testing is considered an integral part of a complete examination. Limited examinations for reoccurring indications may be performed as noted. The reflux portion of the exam is performed with the patient in reverse Trendelenburg.  +---------+---------------+---------+-----------+----------+--------------+ RIGHT    CompressibilityPhasicitySpontaneityPropertiesThrombus Aging +---------+---------------+---------+-----------+----------+--------------+ CFV      Full           Yes      Yes                                 +---------+---------------+---------+-----------+----------+--------------+ SFJ      Full                                                        +---------+---------------+---------+-----------+----------+--------------+ FV Prox  Full                                                        +---------+---------------+---------+-----------+----------+--------------+  FV Mid   Full                                                        +---------+---------------+---------+-----------+----------+--------------+ FV DistalFull                                                        +---------+---------------+---------+-----------+----------+--------------+ PFV      Full                                                        +---------+---------------+---------+-----------+----------+--------------+ POP      Full            Yes      Yes                                 +---------+---------------+---------+-----------+----------+--------------+ PTV      Full                                                        +---------+---------------+---------+-----------+----------+--------------+ PERO     Full                                                        +---------+---------------+---------+-----------+----------+--------------+   +---------+---------------+---------+-----------+----------+--------------+ LEFT     CompressibilityPhasicitySpontaneityPropertiesThrombus Aging +---------+---------------+---------+-----------+----------+--------------+ CFV      Full           Yes      Yes                                 +---------+---------------+---------+-----------+----------+--------------+ SFJ      Full                                                        +---------+---------------+---------+-----------+----------+--------------+ FV Prox  Full                                                        +---------+---------------+---------+-----------+----------+--------------+ FV Mid   Full                                                        +---------+---------------+---------+-----------+----------+--------------+  FV DistalFull                                                        +---------+---------------+---------+-----------+----------+--------------+ PFV      Full                                                        +---------+---------------+---------+-----------+----------+--------------+ POP      Full           Yes      Yes                                 +---------+---------------+---------+-----------+----------+--------------+ PTV      Full                                                        +---------+---------------+---------+-----------+----------+--------------+ PERO     Full                                                         +---------+---------------+---------+-----------+----------+--------------+     Summary: RIGHT: - There is no evidence of deep vein thrombosis in the lower extremity.  - No cystic structure found in the popliteal fossa.  LEFT: - There is no evidence of deep vein thrombosis in the lower extremity.  - No cystic structure found in the popliteal fossa.  *See table(s) above for measurements and observations. Electronically signed by Deitra Mayo MD on 05/21/2021 at 2:01:37 PM.    Final      Labs:   Basic Metabolic Panel: Recent Labs  Lab 05/20/21 1201 05/20/21 2220 05/21/21 0447 05/22/21 0454 05/23/21 0459  NA 135  --  137 133* 133*  K 3.9  --  3.9 3.3* 3.0*  CL 106  --  110 106 101  CO2 20*  --  21* 22 25  GLUCOSE 108*  --  104* 94 99  BUN 28*  --  29* 25* 23  CREATININE 1.80* 1.41* 1.17* 1.28* 1.08*  CALCIUM 7.7*  --  7.6* 7.3* 7.4*  MG  --  2.2 2.2  --   --   PHOS  --  4.2 3.8  --   --    GFR Estimated Creatinine Clearance: 39.5 mL/min (A) (by C-G formula based on SCr of 1.08 mg/dL (H)). Liver Function Tests: Recent Labs  Lab 05/20/21 1201 05/21/21 0447  AST 72* 54*  ALT 24 21  ALKPHOS 323* 267*  BILITOT 6.0* 5.5*  PROT 7.7 6.6  ALBUMIN <1.5* <1.5*   Recent Labs  Lab 05/20/21 1201  LIPASE 51   Recent Labs  Lab 05/21/21 0447  AMMONIA 38*   Coagulation profile Recent Labs  Lab 05/20/21 1201  INR 1.4*    CBC: Recent Labs  Lab 05/20/21  1201 05/20/21 2220 05/21/21 0447 05/22/21 0454 05/23/21 0459  WBC 13.6* 13.6* 13.5* 10.4 9.6  NEUTROABS 10.5*  --   --   --   --   HGB 9.0* 8.6* 7.8* 7.1* 7.1*  HCT 27.8* 27.0* 24.4* 22.1* 21.9*  MCV 95.9 98.9 98.0 95.7 97.8  PLT 184 158 148* 131* 131*   Cardiac Enzymes: No results for input(s): CKTOTAL, CKMB, CKMBINDEX, TROPONINI in the last 168 hours. BNP: Invalid input(s): POCBNP CBG: No results for input(s): GLUCAP in the last 168 hours. D-Dimer No results for input(s): DDIMER in the last 72  hours. Hgb A1c No results for input(s): HGBA1C in the last 72 hours. Lipid Profile No results for input(s): CHOL, HDL, LDLCALC, TRIG, CHOLHDL, LDLDIRECT in the last 72 hours. Thyroid function studies No results for input(s): TSH, T4TOTAL, T3FREE, THYROIDAB in the last 72 hours.  Invalid input(s): FREET3 Anemia work up No results for input(s): VITAMINB12, FOLATE, FERRITIN, TIBC, IRON, RETICCTPCT in the last 72 hours. Microbiology Recent Results (from the past 240 hour(s))  Urine Culture     Status: Abnormal   Collection Time: 05/20/21 12:01 PM   Specimen: Urine, Random  Result Value Ref Range Status   Specimen Description   Final    URINE, RANDOM Performed at [facility], [address] [place], [place] [address]    Special Requests   Final    NONE Performed at [facility], [address] [place], [place] [address]    Culture >=100,000 COLONIES/mL ESCHERICHIA COLI (A)  Final   Report Status 05/22/2021 FINAL  Final   Organism ID, Bacteria ESCHERICHIA COLI (A)  Final      Susceptibility   Escherichia coli - MIC*    AMPICILLIN >=32 RESISTANT Resistant     CEFAZOLIN 8 SENSITIVE Sensitive     CEFEPIME <=0.12 SENSITIVE Sensitive     CEFTRIAXONE <=0.25 SENSITIVE Sensitive     CIPROFLOXACIN <=0.25 SENSITIVE Sensitive     GENTAMICIN <=1 SENSITIVE Sensitive     IMIPENEM <=0.25 SENSITIVE Sensitive     NITROFURANTOIN <=16 SENSITIVE Sensitive     TRIMETH/SULFA <=20 SENSITIVE Sensitive     AMPICILLIN/SULBACTAM >=32 RESISTANT Resistant     PIP/TAZO 64 INTERMEDIATE Intermediate     * >=100,000 COLONIES/mL ESCHERICHIA COLI  Resp Panel by RT-PCR (Flu A&B, Covid) Nasopharyngeal Swab     Status: None   Collection Time: 05/20/21  3:13 PM   Specimen: Nasopharyngeal Swab; Nasopharyngeal(NP) swabs in vial transport medium  Result Value Ref Range Status   SARS Coronavirus 2 by RT PCR NEGATIVE NEGATIVE Final    Comment: (NOTE) SARS-CoV-2 target nucleic acids are NOT  DETECTED.  The SARS-CoV-2 RNA is generally detectable in upper respiratory specimens during the acute phase of infection. The lowest concentration of SARS-CoV-2 viral copies this assay can detect is 138 copies/mL. A negative result does not preclude SARS-Cov-2 infection and should not be used as the sole basis for treatment or other patient management decisions. A negative result may occur with  improper specimen collection/handling, submission of specimen other than nasopharyngeal swab, presence of viral mutation(s) within the areas targeted by this assay, and inadequate number of viral copies(<138 copies/mL). A negative result must be combined with clinical observations, patient history, and epidemiological information. The expected result is Negative.  Fact Sheet for Patients:  EntrepreneurPulse.com.au  Fact Sheet for Healthcare Providers:  IncredibleEmployment.be  This test is no t yet approved or cleared by the [place] and  has been authorized for detection and/or diagnosis of SARS-CoV-2 by FDA under an Emergency Use Authorization (EUA). This EUA will remain  in effect (meaning this test can be used) for the duration of the COVID-19 declaration under Section 564(b)(1) of the Act, 21 U.S.C.section 360bbb-3(b)(1), unless the authorization is terminated  or revoked sooner.       Influenza A by PCR NEGATIVE NEGATIVE Final   Influenza B by PCR NEGATIVE NEGATIVE Final    Comment: (NOTE) The Xpert Xpress SARS-CoV-2/FLU/RSV plus assay is intended as an aid in the diagnosis of influenza from Nasopharyngeal swab specimens and should not be used as a sole basis for treatment. Nasal washings and aspirates are unacceptable for Xpert Xpress SARS-CoV-2/FLU/RSV testing.  Fact Sheet for Patients: EntrepreneurPulse.com.au  Fact Sheet for Healthcare Providers: IncredibleEmployment.be  This test is not yet  approved or cleared by the [place] FDA and has been authorized for detection and/or diagnosis of SARS-CoV-2 by FDA under an Emergency Use Authorization (EUA). This EUA will remain in effect (meaning this test can be used) for the duration of the COVID-19 declaration under Section 564(b)(1) of the Act, 21 U.S.C. section 360bbb-3(b)(1), unless the authorization is terminated or revoked.  Performed at [facility], [place] [address] [place], [place] [address]   Blood culture (routine x 2)     Status: None (Preliminary result)   Collection Time: 05/20/21  4:12 PM   Specimen: Left Antecubital; Blood  Result Value Ref Range Status   Specimen Description   Final    LEFT ANTECUBITAL Performed at [facility], [address] [place], [place] [address]    Special Requests   Final    BOTTLES DRAWN AEROBIC AND ANAEROBIC Blood Culture adequate volume Performed at [facility], [address] [place], [place] [address]    Culture   Final    NO GROWTH 3 DAYS Performed at [facility], [place] [address] [place], [place] [address]    Report Status PENDING  Incomplete  Blood culture (routine x 2)     Status: None (Preliminary result)   Collection Time: 05/20/21 10:10 PM   Specimen: BLOOD RIGHT WRIST  Result Value Ref Range Status   Specimen Description   Final    BLOOD RIGHT WRIST Performed at [place] [address] [place], [place] [address]    Special Requests   Final    BOTTLES DRAWN AEROBIC ONLY Blood Culture adequate volume Performed at [place] [address] [place], [place] [address]    Culture   Final    NO GROWTH 2 DAYS Performed at [place] [address] [place],  [address]    Report Status PENDING  Incomplete     Discharge Instructions:   Discharge Instructions     Diet - low sodium heart healthy   Complete by: As directed    Discharge instructions   Complete  by: As directed    Follow-up with gastroenterologist as scheduled on June 26, 2021   Face-to-face encounter (required for Medicare/Medicaid patients)   Complete by: As directed    I Jennye Boroughs certify that this patient is under my care and that I, or a nurse practitioner or physician's assistant working with me, had a face-to-face encounter that meets the physician face-to-face encounter requirements with this patient on 05/23/2021. The encounter with the patient was in whole, or in part for the following medical condition(s) which  is the primary reason for home health care (List medical condition): Unsteady gait, CHF, liver cirrhosis   The encounter with the patient was in whole, or in part, for the following medical condition, which is the primary reason for home health care: Unsteady gait, CHF, liver cirrhosis   I certify that, based on my findings, the following services are medically necessary home health services: Physical therapy   Reason for Medically Necessary Home Health Services: Therapy- Personnel officer, Public librarian   My clinical findings support the need for the above services: Unsafe ambulation due to balance issues   Further, I certify that my clinical findings support that this patient is homebound due to: Unsafe ambulation due to balance issues   For home use only DME Shower stool   Complete by: As directed    Home Health   Complete by: As directed    To provide the following care/treatments:  PT OT     Increase activity slowly   Complete by: As directed       Allergies as of 05/23/2021   No Known Allergies      Medication List     TAKE these medications    cephALEXin 500 MG capsule Commonly known as: KEFLEX Take 1 capsule (500 mg total) by mouth 3 (three) times daily for 4 days.   furosemide 40 MG tablet Commonly known as: LASIX Take 1 tablet (40 mg total) by mouth daily.   potassium chloride SA 20 MEQ tablet Commonly known as:  KLOR-CON Take 1 tablet (20 mEq total) by mouth daily for 3 days. Start taking on: May 24, 2021   spironolactone 50 MG tablet Commonly known as: ALDACTONE Take 1 tablet (50 mg total) by mouth daily.               Durable Medical Equipment  (From admission, onward)           Start     Ordered   05/23/21 1239  DME Gilford Rile  Once       Question Answer Comment  Walker: With [place] Wheels   Patient needs a walker to treat with the following condition Unsteady gait      05/23/21 1238   05/23/21 0000  For home use only DME Shower stool        05/23/21 1238            Follow-up [employer]. Go on 05/27/2021.   Specialty: Internal Medicine Why: Monday at [facility] for your hospital follow up appointment.  It is imperative you go to this appointment so that you can get the home health services.  Call to reschedule if this time does not work for you Contact information: [place] [id number] [place] [id number] [place], [facility] Follow up.   Specialty: Home Health Services Why: They will come see you on Tuesday after you go to the Monday appointment Contact information: [place] [address] [phone]                   If you experience worsening of your admission symptoms, develop shortness of breath, life threatening emergency, suicidal or homicidal thoughts you must seek medical attention immediately by calling 911 or calling your MD immediately  if symptoms less severe.   You must read complete instructions/literature along with all the possible adverse  reactions/side effects for all the medicines you take and that have been prescribed to you. Take any new medicines after you have completely understood and accept all the possible adverse reactions/side effects.    Please note   You were cared for by a hospitalist during your hospital stay.  If you have any questions about your discharge medications or the care you received while you were in the hospital after you are discharged, you can call the unit and asked to speak with the hospitalist on call if the hospitalist that took care of you is not available. Once you are discharged, your primary care physician will handle any further medical issues. Please note that NO REFILLS for any discharge medications will be authorized once you are discharged, as it is imperative that you return to your primary care physician (or establish a relationship with a primary care physician if you do not have one) for your aftercare needs so that they can reassess your need for medications and monitor your lab values.       Time coordinating discharge: 36 minutes  Signed:  Farrell Pantaleo  [facility] 05/23/2021, 12:38 PM   Pager on [email or web address]. If 7PM-7AM, please contact night-coverage at [email or web address]

## 2021-05-23 NOTE — Consult Note (Addendum)
Urology Progress Note  Physician requesting consult: Irene Pap MD  Reason for consult: Gross hematuria   History of Present Illness: Hannah Blake is a 70 y.o. with Bells palsy and recent history of urinary retention and massive abdominal ascites. Patient intially presented to the ED on 10/12 due to abdominal swelling and inability to urinate. On catheter placement there was 300cc urine out, bladder scan remained elevated so she underwent CT imaging which showed massive ascites. She remained with catheter since that time and has yet to follow up with urology. Patient represented to the ED due to abdominal swelling and gross hematuria from her catheter x 2 days. She underwent paracentesis in the ED with removal of 2L fluid. Prior to this her catheter was draining clear yellow. No fevers or chills at home. No nausea or vomitinng.   Catheter draining clear yellow on recheck after catheter exchange this morning.   Interval 11/3: -Urine remains clear yellow, no further hematuria after catheter exchange  -Creatinine improved to 1.08  -Urine culture growing E.coli -Patient feeling better, abdominal pain improved    Past Medical History:  Diagnosis Date   Bell's palsy     Past Surgical History:  Procedure Laterality Date   [facility] Medications:  Home meds:  No current facility-administered medications on file prior to encounter.   No current outpatient medications on file prior to encounter.     Scheduled Meds:  (feeding supplement) PROSource Plus  30 mL Oral BID BM   Chlorhexidine Gluconate Cloth  6 each Topical Daily   feeding supplement  237 mL Oral BID BM   furosemide  40 mg Oral Daily   multivitamin with minerals  1 tablet Oral Daily   spironolactone  50 mg Oral Daily   Continuous Infusions:   ceFAZolin (ANCEF) IV     PRN Meds:.alum & mag hydroxide-simeth, melatonin, polyethylene glycol, prochlorperazine  Allergies: No  Known Allergies  History reviewed. No pertinent family history.  Social History:  reports that she has been smoking cigarettes. She has never used smokeless tobacco. She reports that she does not currently use alcohol. She reports that she does not use drugs.  ROS: A complete review of systems was performed.  All systems are negative except for pertinent findings as noted.  Physical Exam:  Vital signs in last 24 hours: Temp:  [98 F (36.7 C)-99 F (37.2 C)] 99 F (37.2 C) (11/03 0353) Pulse Rate:  [92-99] 99 (11/03 0353) Resp:  [18] 18 (11/03 0353) BP: (91-93)/(54-57) 93/54 (11/03 0353) SpO2:  [98 %-99 %] 98 % (11/03 0353) Weight:  [59.1 kg] 59.1 kg (11/03 0132) Constitutional:  Alert and oriented, No acute distress Cardiovascular: Regular rate and rhythm, No JVD Respiratory: Normal respiratory effort, Lungs clear bilaterally GI: Abdomen is soft, nontender, moderately distended GU: No CVA tenderness, 14Fr foley in place with clear yellow urine  Lymphatic: No lymphadenopathy Neurologic: Grossly intact, no focal deficits Psychiatric: Normal mood and affect  Laboratory Data:  Recent Labs    05/20/21 2220 05/21/21 0447 05/22/21 0454 05/23/21 0459  WBC 13.6* 13.5* 10.4 9.6  HGB 8.6* 7.8* 7.1* 7.1*  HCT 27.0* 24.4* 22.1* 21.9*  PLT 158 148* 131* 131*     Recent Labs    05/20/21 2220 05/21/21 0447 05/22/21 0454 05/23/21 0459  NA  --  137 133* 133*  K  --  3.9 3.3* 3.0*  CL  --  110  106 101  GLUCOSE  --  104* 94 99  BUN  --  29* 25* 23  CALCIUM  --  7.6* 7.3* 7.4*  CREATININE 1.41* 1.17* 1.28* 1.08*      Results for orders placed or performed during the hospital encounter of 05/20/21 (from the past 24 hour(s))  Basic metabolic panel     Status: Abnormal   Collection Time: 05/23/21  4:59 AM  Result Value Ref Range   Sodium 133 (L) 135 - 145 mmol/L   Potassium 3.0 (L) 3.5 - 5.1 mmol/L   Chloride 101 98 - 111 mmol/L   CO2 25 22 - 32 mmol/L   Glucose, Bld 99 70  - 99 mg/dL   BUN 23 8 - 23 mg/dL   Creatinine, Ser 8.46 (H) 0.44 - 1.00 mg/dL   Calcium 7.4 (L) 8.9 - 10.3 mg/dL   GFR, Estimated 55 (L) >60 mL/min   Anion gap 7 5 - 15  CBC     Status: Abnormal   Collection Time: 05/23/21  4:59 AM  Result Value Ref Range   WBC 9.6 4.0 - 10.5 K/uL   RBC 2.24 (L) 3.87 - 5.11 MIL/uL   Hemoglobin 7.1 (L) 12.0 - 15.0 g/dL   HCT 96.2 (L) 95.2 - 84.1 %   MCV 97.8 80.0 - 100.0 fL   MCH 31.7 26.0 - 34.0 pg   MCHC 32.4 30.0 - 36.0 g/dL   RDW 32.4 (H) 40.1 - 02.7 %   Platelets 131 (L) 150 - 400 K/uL   nRBC 0.0 0.0 - 0.2 %   Recent Results (from the past 240 hour(s))  Urine Culture     Status: Abnormal   Collection Time: 05/20/21 12:01 PM   Specimen: Urine, Random  Result Value Ref Range Status   Specimen Description   Final    URINE, RANDOM Performed at [facility], [address] [place], [place] [address]    Special Requests   Final    NONE Performed at [facility], [address] [place], [place] [address]    Culture >=100,000 COLONIES/mL ESCHERICHIA COLI (A)  Final   Report Status 05/22/2021 FINAL  Final   Organism ID, Bacteria ESCHERICHIA COLI (A)  Final      Susceptibility   Escherichia coli - MIC*    AMPICILLIN >=32 RESISTANT Resistant     CEFAZOLIN 8 SENSITIVE Sensitive     CEFEPIME <=0.12 SENSITIVE Sensitive     CEFTRIAXONE <=0.25 SENSITIVE Sensitive     CIPROFLOXACIN <=0.25 SENSITIVE Sensitive     GENTAMICIN <=1 SENSITIVE Sensitive     IMIPENEM <=0.25 SENSITIVE Sensitive     NITROFURANTOIN <=16 SENSITIVE Sensitive     TRIMETH/SULFA <=20 SENSITIVE Sensitive     AMPICILLIN/SULBACTAM >=32 RESISTANT Resistant     PIP/TAZO 64 INTERMEDIATE Intermediate     * >=100,000 COLONIES/mL ESCHERICHIA COLI  Resp Panel by RT-PCR (Flu A&B, Covid) Nasopharyngeal Swab     Status: None   Collection Time: 05/20/21  3:13 PM   Specimen: Nasopharyngeal Swab; Nasopharyngeal(NP) swabs in vial transport medium  Result Value Ref  Range Status   SARS Coronavirus 2 by RT PCR NEGATIVE NEGATIVE Final    Comment: (NOTE) SARS-CoV-2 target nucleic acids are NOT DETECTED.  The SARS-CoV-2 RNA is generally detectable in upper respiratory specimens during the acute phase of infection. The lowest concentration of SARS-CoV-2 viral copies this assay can detect is 138 copies/mL. A negative result does not preclude SARS-Cov-2 infection and  should not be used as the sole basis for treatment or other patient management decisions. A negative result may occur with  improper specimen collection/handling, submission of specimen other than nasopharyngeal swab, presence of viral mutation(s) within the areas targeted by this assay, and inadequate number of viral copies(<138 copies/mL). A negative result must be combined with clinical observations, patient history, and epidemiological information. The expected result is Negative.  Fact Sheet for Patients:  EntrepreneurPulse.com.au  Fact Sheet for Healthcare Providers:  IncredibleEmployment.be  This test is no t yet approved or cleared by the [place] FDA and  has been authorized for detection and/or diagnosis of SARS-CoV-2 by FDA under an Emergency Use Authorization (EUA). This EUA will remain  in effect (meaning this test can be used) for the duration of the COVID-19 declaration under Section 564(b)(1) of the Act, 21 U.S.C.section 360bbb-3(b)(1), unless the authorization is terminated  or revoked sooner.       Influenza A by PCR NEGATIVE NEGATIVE Final   Influenza B by PCR NEGATIVE NEGATIVE Final    Comment: (NOTE) The Xpert Xpress SARS-CoV-2/FLU/RSV plus assay is intended as an aid in the diagnosis of influenza from Nasopharyngeal swab specimens and should not be used as a sole basis for treatment. Nasal washings and aspirates are unacceptable for Xpert Xpress SARS-CoV-2/FLU/RSV testing.  Fact Sheet for  Patients: EntrepreneurPulse.com.au  Fact Sheet for Healthcare Providers: IncredibleEmployment.be  This test is not yet approved or cleared by the [place] FDA and has been authorized for detection and/or diagnosis of SARS-CoV-2 by FDA under an Emergency Use Authorization (EUA). This EUA will remain in effect (meaning this test can be used) for the duration of the COVID-19 declaration under Section 564(b)(1) of the Act, 21 U.S.C. section 360bbb-3(b)(1), unless the authorization is terminated or revoked.  Performed at [facility], [place] [address] [place], [place] [address]   Blood culture (routine x 2)     Status: None (Preliminary result)   Collection Time: 05/20/21  4:12 PM   Specimen: Left Antecubital; Blood  Result Value Ref Range Status   Specimen Description   Final    LEFT ANTECUBITAL Performed at [facility], [address] [place], [place] [address]    Special Requests   Final    BOTTLES DRAWN AEROBIC AND ANAEROBIC Blood Culture adequate volume Performed at [facility], [address] [place], [place] [address]    Culture   Final    NO GROWTH 3 DAYS Performed at [facility], [place] [address] [place], [place] [address]    Report Status PENDING  Incomplete  Blood culture (routine x 2)     Status: None (Preliminary result)   Collection Time: 05/20/21 10:10 PM   Specimen: BLOOD RIGHT WRIST  Result Value Ref Range Status   Specimen Description   Final    BLOOD RIGHT WRIST Performed at [place] [address] [place], [place] [address]    Special Requests   Final    BOTTLES DRAWN AEROBIC ONLY Blood Culture adequate volume Performed at [place] [address] [place], [place] [address]    Culture   Final    NO GROWTH 2 DAYS Performed at [place] [address], [place], [place] [address]    Report Status PENDING  Incomplete     Renal Function: Recent Labs    05/20/21 1201 05/20/21 2220 05/21/21 0447 05/22/21 0454 05/23/21 0459  CREATININE  1.80* 1.41* 1.17* 1.28* 1.08*    Estimated Creatinine Clearance: 39.5 mL/min (A) (by C-G formula based on SCr of 1.08 mg/dL (H)).  Radiologic Imaging: DG Chest 2 View  Result Date: 05/23/2021 CLINICAL DATA:  CHF EXAM: CHEST - 2 VIEW COMPARISON:  05/20/2021 FINDINGS: Low lung volumes. Eventration of the right hemidiaphragm. Similar interstitial changes. Small left pleural effusion with left basilar atelectasis. Stable cardiomediastinal contours. Diffusely decreased osseous mineralization. Age-indeterminate thoracic compression fractures some of which were imaged on October CT abdomen. IMPRESSION: Small left pleural effusion with left basilar atelectasis. Age-indeterminate interstitial changes. A component of interstitial edema is difficult to exclude. Electronically Signed   By: Macy Mis M.D.   On: 05/23/2021 15:32   [place] ASCITES (ABDOMEN LIMITED)  Result Date: 05/23/2021 CLINICAL DATA:  Ascites EXAM: LIMITED ABDOMEN ULTRASOUND FOR ASCITES TECHNIQUE: Limited ultrasound survey for ascites was performed in all four abdominal quadrants. COMPARISON:  CT abdomen pelvis 05/01/2021 FINDINGS: Mild ascites noted in all 4 quadrants, significantly decreased since prior CT. IMPRESSION: Mild abdominal ascites. Electronically Signed   By: Miachel Roux M.D.   On: 05/23/2021 12:28    I independently reviewed the above imaging studies.  Impression/Recommendation: 70 y/o female with possible acute urinary retention and new hematuria with indwelling foley in place, now resolved.   Urinary retention: Unclear if patient truly ever in urinary retention as documentation from initial placement shows 300cc of urine output on placement. Bladder scan was >1L but this was likely due to her ascites.  -Okay attempt voiding trial while inpatient at discretion of primary team.  -If fails inpatient  TOV, she will need to discharge with foley until outpatient follow up with urology   Gross hematuria (resolved): Started while indwelling foley catheter in place. CT obtained during initial ED visit showed no GU pathology. Etiology most likely from UTI versus foley trauma. Bladder ultrasound attempted during admission but poor image quality due to ascites. Okay to forgo this during admission given recent axial imaging  -Agree with treatment for complicated UTI based on sensitivities  -Urology to arrange outpatient follow up. We will sign off at this time    Aldine Contes 05/23/2021, 4:20 PM

## 2021-05-23 NOTE — Progress Notes (Signed)
[facility] Gastroenterology Progress Note  Hannah Blake 70 y.o. 1950-11-03  CC:  Cirrhosis with ascites   Subjective: Patient assessed at bedside. She was sitting comfortable and had eaten breakfast and tolerated it well.  She states the abdominal pain has improved, though she has continued epigastric pain that is worse without food. .  She had one bowel movement yesterday and it was grey in color.  Denies melena or hematochezia.  Denies fever or chills, nausea or vomiting.     ROS : Review of Systems  Constitutional:  Negative for chills, fever and weight loss.  HENT:  Negative for congestion and sore throat.   Eyes:  Negative for blurred vision.  Respiratory:  Negative for cough.   Cardiovascular:  Negative for chest pain and palpitations.  Gastrointestinal:  Positive for abdominal pain (epigastric improved with food). Negative for blood in stool, constipation, diarrhea, heartburn, melena, nausea and vomiting.  Genitourinary:  Negative for frequency and urgency.  Musculoskeletal:  Negative for back pain and myalgias.  Neurological:  Negative for dizziness and headaches.     Objective: Vital signs in last 24 hours: Vitals:   05/22/21 1939 05/23/21 0353  BP: (!) 91/57 (!) 93/54  Pulse: 92 99  Resp: 18 18  Temp: 98 F (36.7 C) 99 F (37.2 C)  SpO2: 99% 98%    Physical Exam: Physical Exam Constitutional:      General: She is not in acute distress. HENT:     Head: Normocephalic and atraumatic.  Cardiovascular:     Rate and Rhythm: Normal rate and regular rhythm.     Pulses: Normal pulses.     Heart sounds: Normal heart sounds. No murmur heard.   No friction rub. No gallop.  Pulmonary:     Effort: No respiratory distress.     Breath sounds: No stridor. No wheezing, rhonchi or rales.  Abdominal:     General: Abdomen is flat. Bowel sounds are normal. There is distension (improved from previous exam).     Palpations: Abdomen is soft. There is no mass.     Tenderness: There  is no abdominal tenderness. There is no guarding or rebound.     Hernia: No hernia is present.  Musculoskeletal:     Cervical back: Neck supple.  Skin:    Coloration: Skin is not jaundiced.  Neurological:     General: No focal deficit present.     Mental Status: She is alert and oriented to person, place, and time. Mental status is at baseline.  Psychiatric:        Mood and Affect: Mood normal.     Lab Results: Recent Labs    05/20/21 2220 05/21/21 0447 05/22/21 0454 05/23/21 0459  NA  --  137 133* 133*  K  --  3.9 3.3* 3.0*  CL  --  110 106 101  CO2  --  21* 22 25  GLUCOSE  --  104* 94 99  BUN  --  29* 25* 23  CREATININE 1.41* 1.17* 1.28* 1.08*  CALCIUM  --  7.6* 7.3* 7.4*  MG 2.2 2.2  --   --   PHOS 4.2 3.8  --   --    Recent Labs    05/20/21 1201 05/21/21 0447  AST 72* 54*  ALT 24 21  ALKPHOS 323* 267*  BILITOT 6.0* 5.5*  PROT 7.7 6.6  ALBUMIN <1.5* <1.5*   Recent Labs    05/20/21 1201 05/20/21 2220 05/22/21 0454 05/23/21 0459  WBC 13.6*   < >  10.4 9.6  NEUTROABS 10.5*  --   --   --   HGB 9.0*   < > 7.1* 7.1*  HCT 27.8*   < > 22.1* 21.9*  MCV 95.9   < > 95.7 97.8  PLT 184   < > 131* 131*   < > = values in this interval not displayed.   Recent Labs    05/20/21 1201  LABPROT 16.9*  INR 1.4*    Lab Results: Results for orders placed or performed during the hospital encounter of 05/20/21 (from the past 48 hour(s))  CBC     Status: Abnormal   Collection Time: 05/22/21  4:54 AM  Result Value Ref Range   WBC 10.4 4.0 - 10.5 K/uL   RBC 2.31 (L) 3.87 - 5.11 MIL/uL   Hemoglobin 7.1 (L) 12.0 - 15.0 g/dL   HCT 83.1 (L) 51.7 - 61.6 %   MCV 95.7 80.0 - 100.0 fL   MCH 30.7 26.0 - 34.0 pg   MCHC 32.1 30.0 - 36.0 g/dL   RDW 07.3 (H) 71.0 - 62.6 %   Platelets 131 (L) 150 - 400 K/uL   nRBC 0.0 0.0 - 0.2 %    Comment: Performed at [facility], [address], [place], [place] [address]  Basic metabolic panel     Status: Abnormal    Collection Time: 05/22/21  4:54 AM  Result Value Ref Range   Sodium 133 (L) 135 - 145 mmol/L   Potassium 3.3 (L) 3.5 - 5.1 mmol/L   Chloride 106 98 - 111 mmol/L   CO2 22 22 - 32 mmol/L   Glucose, Bld 94 70 - 99 mg/dL    Comment: Glucose reference range applies only to samples taken after fasting for at least 8 hours.   BUN 25 (H) 8 - 23 mg/dL   Creatinine, Ser 6.27 (H) 0.44 - 1.00 mg/dL   Calcium 7.3 (L) 8.9 - 10.3 mg/dL   GFR, Estimated 45 (L) >60 mL/min    Comment: (NOTE) Calculated using the CKD-EPI Creatinine Equation (2021)    Anion gap 5 5 - 15    Comment: Performed at [facility], [address] [place], [place] [address]  HCV Ab Reflex to Quant PCR     Status: None   Collection Time: 05/22/21 11:13 AM  Result Value Ref Range   HCV Ab 0.1 0.0 - 0.9 s/co ratio    Comment: (NOTE) Performed At: [facility] [address] [place], [place] [id number] Jolene Schimke MD ZJ:[id number]   Interpretation:     Status: None   Collection Time: 05/22/21 11:13 AM  Result Value Ref Range   HCV Interp 1: Comment     Comment: (NOTE) Negative Not infected with HCV, unless recent infection is suspected or other evidence exists to indicate HCV infection. Performed At: [facility] [address] [place], [place] [id number] Jolene Schimke MD ID:[id number]   Basic metabolic panel     Status: Abnormal   Collection Time: 05/23/21  4:59 AM  Result Value Ref Range   Sodium 133 (L) 135 - 145 mmol/L   Potassium 3.0 (L) 3.5 - 5.1 mmol/L   Chloride 101 98 - 111 mmol/L   CO2 25 22 - 32 mmol/L   Glucose, Bld 99 70 - 99 mg/dL    Comment: Glucose reference range applies only to samples taken after fasting for at least 8 hours.   BUN 23 8 - 23 mg/dL   Creatinine, Ser  1.08 (H) 0.44 - 1.00 mg/dL   Calcium 7.4 (L) 8.9 - 10.3 mg/dL   GFR, Estimated 55 (L) >60 mL/min    Comment: (NOTE) Calculated using the CKD-EPI Creatinine Equation (2021)    Anion gap 7 5 -  15    Comment: Performed at [facility], [address] [place], [place] [address]  CBC     Status: Abnormal   Collection Time: 05/23/21  4:59 AM  Result Value Ref Range   WBC 9.6 4.0 - 10.5 K/uL   RBC 2.24 (L) 3.87 - 5.11 MIL/uL   Hemoglobin 7.1 (L) 12.0 - 15.0 g/dL    Comment: REPEATED TO VERIFY   HCT 21.9 (L) 36.0 - 46.0 %   MCV 97.8 80.0 - 100.0 fL   MCH 31.7 26.0 - 34.0 pg   MCHC 32.4 30.0 - 36.0 g/dL   RDW 03.7 (H) 04.8 - 88.9 %   Platelets 131 (L) 150 - 400 K/uL   nRBC 0.0 0.0 - 0.2 %    Comment: Performed at [facility], [address] [place], [place] [address]    Assessment/Plan: Decompensated cirrhosis  05/23/2021 WBC 9.6 HGB 7.1 MCV 97.8 Platelets 131 AST 54 ALT 21 Alkphos 267 TBili 5.5 GFR 55  INR 1.4  05/22/2021 WBC 10.4 HGB 7.1  MCV 95.9 Platelets 131 AST 54 ALT 21 Alkphos 267 TBili 5.5 GFR 45  INR 1.4  MELD-Na score: 22 at 05/22/2021  4:54 AM MELD score: 19 at 05/22/2021  4:54 AM Calculated from: Serum Creatinine: 1.28 mg/dL at 09/26/8826  0:03 AM Serum Sodium: 133 mmol/L at 05/22/2021  4:54 AM Total Bilirubin: 5.5 mg/dL at 49/07/7913  0:56 AM INR(ratio): 1.4 at 05/20/2021 12:01 PM Age: 50 years   Pending ANA, Anti-smooth muscle Ab, and AMA to assess for etiology of cirrhosis. Possibly due to prolonged history of alcohol use 1-2 beers per day over 30 years.  PCR hepatitis C with genotype wnl.  Ascites: No SBP on analysis, likely from cirrhosis. Cell cytology Mixed acute and chronic inflammation is present.  Rare atypical cells are present - favor reactive mesothelial cells. Continue furosemide 40 mg daily along with spironolactone 50 mg    Patient has never had EGD Mild anemia, may be dilutional, no overt GI bleed No evidence of melena/hematochezia/hematemesis. Will need EGD outpatient      LOS: 3 days    Doree Albee PA-C 05/23/2021, 11:35 AM  Contact #  [phone]

## 2021-05-23 NOTE — TOC Transition Note (Incomplete)
Transition of Care [facility]) - CM/SW Discharge Note   Patient Details  Name: Hannah Blake MRN: [id number] Date of Birth: [birth year]  [facility] CM/SW Contact:  Ida Rogue, LCSW Phone Number: 05/23/2021, 10:02 AM   Clinical Narrative:   Patient seen in follow up to PT recommendation of HH PT. [name] was sitting up eating breakfast when I arrived, her grandson Casimiro Needle [phone] had been there overnight and I primarily interacted with him.  [name] appears to understand English well, speaks minimally. Casimiro Needle states she had been staying alone up to this point with regular family check-ins, and someone will be with her around the clock upon d/c.  They are open to recommended DME and [facility] PT services.  She does not have a PCP. I got her an appointment at [facility] patient care center for Monday.  Cindie with [facility] confirms that they will be able to provide home health services.  Contacted [facility] for delivery of DME RW and shower stool.     Final next level of care: Home w Home Health Services Barriers to Discharge: No Barriers Identified   Patient Goals and CMS Choice        Discharge Placement                       Discharge Plan and Services                                     Social Determinants of Health (SDOH) Interventions     Readmission Risk Interventions No flowsheet data found.

## 2021-05-25 LAB — CULTURE, BLOOD (ROUTINE X 2)
Culture: NO GROWTH
Special Requests: ADEQUATE

## 2021-05-26 LAB — CULTURE, BLOOD (ROUTINE X 2)
Culture: NO GROWTH
Special Requests: ADEQUATE

## 2021-05-27 ENCOUNTER — Other Ambulatory Visit: Payer: Self-pay

## 2021-05-27 ENCOUNTER — Ambulatory Visit (INDEPENDENT_AMBULATORY_CARE_PROVIDER_SITE_OTHER): Payer: Self-pay | Admitting: Nurse Practitioner

## 2021-05-27 VITALS — BP 108/54 | HR 101 | Temp 97.5°F | Ht 59.0 in | Wt 104.0 lb

## 2021-05-27 DIAGNOSIS — N179 Acute kidney failure, unspecified: Secondary | ICD-10-CM

## 2021-05-27 DIAGNOSIS — D649 Anemia, unspecified: Secondary | ICD-10-CM

## 2021-05-27 DIAGNOSIS — Z7689 Persons encountering health services in other specified circumstances: Secondary | ICD-10-CM

## 2021-05-27 DIAGNOSIS — I5021 Acute systolic (congestive) heart failure: Secondary | ICD-10-CM

## 2021-05-27 LAB — BODY FLUID CULTURE W GRAM STAIN
Culture: NO GROWTH
Gram Stain: NONE SEEN

## 2021-05-27 NOTE — Patient Instructions (Signed)
Health Maintenance, Female Adopting a healthy lifestyle and getting preventive care are important in promoting health and wellness. Ask your health care provider about: The right schedule for you to have regular tests and exams. Things you can do on your own to prevent diseases and keep yourself healthy. What should I know about diet, weight, and exercise? Eat a healthy diet  Eat a diet that includes plenty of vegetables, fruits, low-fat dairy products, and lean protein. Do not eat a lot of foods that are high in solid fats, added sugars, or sodium. Maintain a healthy weight Body mass index (BMI) is used to identify weight problems. It estimates body fat based on height and weight. Your health care provider can help determine your BMI and help you achieve or maintain a healthy weight. Get regular exercise Get regular exercise. This is one of the most important things you can do for your health. Most adults should: Exercise for at least 150 minutes each week. The exercise should increase your heart rate and make you sweat (moderate-intensity exercise). Do strengthening exercises at least twice a week. This is in addition to the moderate-intensity exercise. Spend less time sitting. Even light physical activity can be beneficial. Watch cholesterol and blood lipids Have your blood tested for lipids and cholesterol at 71 years of age, then have this test every 5 years. Have your cholesterol levels checked more often if: Your lipid or cholesterol levels are high. You are older than 71 years of age. You are at high risk for heart disease. What should I know about cancer screening? Depending on your health history and family history, you may need to have cancer screening at various ages. This may include screening for: Breast cancer. Cervical cancer. Colorectal cancer. Skin cancer. Lung cancer. What should I know about heart disease, diabetes, and high blood pressure? Blood pressure and heart  disease High blood pressure causes heart disease and increases the risk of stroke. This is more likely to develop in people who have high blood pressure readings or are overweight. Have your blood pressure checked: Every 3-5 years if you are 18-39 years of age. Every year if you are 40 years old or older. Diabetes Have regular diabetes screenings. This checks your fasting blood sugar level. Have the screening done: Once every three years after age 40 if you are at a normal weight and have a low risk for diabetes. More often and at a younger age if you are overweight or have a high risk for diabetes. What should I know about preventing infection? Hepatitis B If you have a higher risk for hepatitis B, you should be screened for this virus. Talk with your health care provider to find out if you are at risk for hepatitis B infection. Hepatitis C Testing is recommended for: Everyone born from 1945 through 1965. Anyone with known risk factors for hepatitis C. Sexually transmitted infections (STIs) Get screened for STIs, including gonorrhea and chlamydia, if: You are sexually active and are younger than 71 years of age. You are older than 71 years of age and your health care provider tells you that you are at risk for this type of infection. Your sexual activity has changed since you were last screened, and you are at increased risk for chlamydia or gonorrhea. Ask your health care provider if you are at risk. Ask your health care provider about whether you are at high risk for HIV. Your health care provider may recommend a prescription medicine to help prevent HIV   infection. If you choose to take medicine to prevent HIV, you should first get tested for HIV. You should then be tested every 3 months for as long as you are taking the medicine. Pregnancy If you are about to stop having your period (premenopausal) and you may become pregnant, seek counseling before you get pregnant. Take 400 to 800  micrograms (mcg) of folic acid every day if you become pregnant. Ask for birth control (contraception) if you want to prevent pregnancy. Osteoporosis and menopause Osteoporosis is a disease in which the bones lose minerals and strength with aging. This can result in bone fractures. If you are 89 years old or older, or if you are at risk for osteoporosis and fractures, ask your health care provider if you should: Be screened for bone loss. Take a calcium or vitamin D supplement to lower your risk of fractures. Be given hormone replacement therapy (HRT) to treat symptoms of menopause. Follow these instructions at home: Alcohol use Do not drink alcohol if: Your health care provider tells you not to drink. You are pregnant, may be pregnant, or are planning to become pregnant. If you drink alcohol: Limit how much you have to: 0-1 drink a day. Know how much alcohol is in your drink. In the [place] one drink equals one 12 oz bottle of beer (355 mL), one 5 oz glass of wine (148 mL), or one 1 oz glass of hard liquor (44 mL). Lifestyle Do not use any products that contain nicotine or tobacco. These products include cigarettes, chewing tobacco, and vaping devices, such as e-cigarettes. If you need help quitting, ask your health care provider. Do not use street drugs. Do not share needles. Ask your health care provider for help if you need support or information about quitting drugs. General instructions Schedule regular health, dental, and eye exams. Stay current with your vaccines. Tell your health care provider if: You often feel depressed. You have ever been abused or do not feel safe at home. Summary Adopting a healthy lifestyle and getting preventive care are important in promoting health and wellness. Follow your health care provider's instructions about healthy diet, exercising, and getting tested or screened for diseases. Follow your health care provider's instructions on monitoring your  cholesterol and blood pressure. This information is not intended to replace advice given to you by your health care provider. Make sure you discuss any questions you have with your health care provider. Document Revised: 11/26/2020 Document Reviewed: 11/26/2020 Elsevier Patient Education  [place] n?a m?t, Ng??i l?n Bell's Palsy, Adult Li?t n?a m?t l tnh tr?ng khng th? c? ??ng c? ? m?t ph?n m?t trong th?i gian ng?n. Tnh tr?ng khng th? c? ??ng, hay cn g?i l li?t, x?y ra do vim ho?c chn p dy th?n kinh s? s? 7. Dy th?n kinh ny ch?y d?c theo h?p s? v d??i tai ??n ph?n bn c?a m?t. Dy th?n kinh ny ch?u trch nhi?m cho cc c? ??ng ? m?t nh? ch?p m?t, nh?m m?t, c??i v cau my. Nguyn nhn g gy ra? Khng r nguyn nhn chnh xc gy ra tnh tr?ng ny. Tnh tr?ng ny c th? do nhi?m vi rt, ch?ng h?n nh? th?y ??u (herpes zoster), Epstein-Barr, ho?c vi rt quai b? gy ra. ?i?u g lm t?ng nguy c?? Qu v? d? b? tnh tr?ng ny h?n n?u: Qu v? ?ang mang [place]. Qu v? b? ti?u ???ng. G?n ?y qu v? b? nhi?m trng ? m?i, h?ng, ho?c ???ng  th?. Qu v? c h? th?ng b?o v? c? th? (h? mi?n d?ch) suy y?u. Qu v? b? ch?n th??ng m?t, ch?ng h?n nh? gy x??ng. Qu v? c ti?n s? gia ?nh b? li?t n?a m?t. Cc d?u hi?u ho?c tri?u ch?ng l g? Nh?ng tri?u ch?ng c?a tnh tr?ng ny bao g?m: Y?u ? m?t bn m?t. Mi m?t v khe mi?ng x? xu?ng. Ch?y qu nhi?u n??c m?t ? m?t bn m?t. Kh nh?m mi m?t. M?t kh. Ch?y n??c di. Mi?ng kh. Thay ??i v? gic. Thay ??i b? ngoi khun m?t. ?au pha sau m?t tai. Ti?ng  ? m?t ho?c c? hai tai. Nh?y c?m m thanh ? m?t bn tai. Co gi?t trn m?t. ?au ??u. Kh? n?ng ni b? suy y?u. Chng m?t. Kh ?n ho?c kh u?ng. Ch? m?t bn m?t b? ?nh h??ng trong h?u h?t th?i gian. Trong tr??ng h?p hi?m g?p, li?t n?a m?t c th? ?nh h??ng ??n ton b? khun m?t. Ch?n ?on tnh tr?ng ny nh? th? no? Tnh tr?ng ny ???c ch?n ?on d?a vo: Tri?u ch?ng c?a qu  v?. B?nh s? c?a qu v?. Khm th?c th?Sander Nephew v? c?ng c th? ph?i g?p chuyn gia ch?m [place] s?c kh?e chuyn v? cc b?nh l c?a dy th?n kinh (bc s? th?n kinh) ho?c v? cc b?nh v tnh tr?ng b?nh l ? m?t (bc s? nhn khoa). Qu v? c th? ph?i lm cc ki?m tra, ch?ng h?n nh?: Ki?m tra th??ng t?n dy th?n kinh (?i?n c? ??). Cc nghin c?u hnh ?nh, ch?ng h?n nh? ch?p CT (ch?p c?t l?p) ho?c MRI (ch?p c?ng h??ng t?). Xt nghi?m mu. Tnh tr?ng ny ???c ?i?u tr? nh? th? no? Tnh tr?ng ny ?nh h??ng ??n m?i ng??i theo cch th?c khc nhau. ?i khi, cc tri?u ch?ng t? h?t m khng c?n ?i?u tr? trong m?t vi tu?n. N?u c?n ?i?u tr?, vi?c ?i?u tr? ? m?i ng??i l khc nhau. M?c tiu c?a ?i?u tr? l gi?m vim v b?o v? m?t kh?i b? th??ng t?n. ?i?u tr? li?t n?a m?t c th? bao g?m: Thu?c, ch?ng h?n nh?: Steroid ?? gi?m s?ng v gi?m vim. Thu?c khng vi rt. Thu?c gi?m ?au, ch?ng h?n nh? aspirin, acetaminophen, hay ibuprofen. Thu?c m? ho?c thu?c nh? m?t ?? gi? ?m m?t. B?o v? m?t, n?u qu v? khng th? nh?m m?t. T?p luy?n ho?c mt-xa ?? ph?c h?i ch?c n?ng v s?c m?nh c?a c? (v?t l tr? li?u). Tun th? nh?ng h??ng d?n ny ? nh:  Ch? s? d?ng thu?c khng k ??n v thu?c k ??n theo ch? d?n c?a chuyn gia ch?m [place] s?c kh?e. N?u m?t qu v? b? ?nh h??ng: Nh? thu?c nh? m?t ho?c tra thu?c m? theo ch? d?n c?a chuyn gia ch?m [place] s?c kh?e c?a qu v? ?? gi? ?m cho m?t. Tun theo ch? d?n c?a chuyn gia ch?m [place] s?c kh?e c?a qu v? ?? ch?m [place] v b?o v? m?t. T?p b?t k? bi t?p v?t l tr? li?u no theo ch? d?n c?a chuyn gia ch?m [place] s?c kh?e c?a qu v?. Tun th? theo t?t c? cc l?n khm l?i. ?i?u ny c vai tr quan tr?ng. Hy lin l?c v?i chuyn gia ch?m [place] s?c kh?e n?u: Qu v? b? s?t ho?c ?n l?nh. Cc tri?u ch?ng c?a qu v? khng c?i thi?n trong vng 2-3 tu?n, ho?c cc tri?u ch?ng c?a qu v? tr?m tr?ng h?n. M?t qu v? ??, b? kch ?ng, ho?c ?au. Qu v? c cc tri?u ch?ng m?i. Tasia Catchings  c?u tr? gip ngay l?p t?c n?u: Ngoi  m?t ra, qu v? b? t ho?c y?u ? m?t ph?n c? th?. Qu v? b? kh nu?t. Qu v? b? ?au ho?c c?ng c?. Qu v? b? chng m?t ho?c kh th?. Tm t?t Li?t n?a m?t l tnh tr?ng khng th? c? ??ng c? ? m?t ph?n m?t trong th?i gian ng?n. Tnh tr?ng khng th? c? ??ng ny l do vim ho?c chn p dy th?n kinh m?t. Tnh tr?ng ny ?nh h??ng ??n m?i ng??i theo cch th?c khc nhau. ?i khi, cc tri?u ch?ng t? h?t m khng c?n ?i?u tr? trong m?t vi tu?n. N?u c?n ?i?u tr?, vi?c ?i?u tr? ? m?i ng??i l khc nhau. M?c tiu c?a ?i?u tr? l gi?m vim v b?o v? m?t kh?i b? th??ng t?n. Lin l?c v?i chuyn gia ch?m [place] s?c kh?e n?u cc tri?u ch?ng c?a qu v? khng ?? trong vng 2-3 tu?n, ho?c cc tri?u ch?ng c?a qu v? tr?m tr?ng h?n. Thng tin ny khng nh?m m?c ?ch thay th? cho l?i khuyn m chuyn gia ch?m [place] s?c kh?e ni v?i qu v?. Hy b?o ??m qu v? ph?i th?o lu?n b?t k? v?n ?? g m qu v? c v?i chuyn gia ch?m [place] s?c kh?e c?a qu v?. Document Revised: 05/02/2020 Document Reviewed: 05/02/2020 Elsevier Patient Education  2022 [employer].

## 2021-05-27 NOTE — Progress Notes (Signed)
[facility] [place], [place]  [address] Phone:  [phone]   Fax:  [phone]   New Patient Office Visit  Subjective:  Patient ID: Hannah Blake, female    DOB: [birth year]  Age: 70 y.o. MRN: [id number]  CC:  Chief Complaint  Patient presents with   Establish Care    ED Admission 05/20/2021 - 05/23/2021;     HPI YAFA BEINE presents to establish care. She  has a past medical history of Bell's palsy.   She is in today with her son and interpreter to establish care.  She was admitted on 05/20/2021 for acute kidney injury.  She has been experiencing urinary retention and had a Foley catheter placed due to gross hematuria and abdominal distention with bilateral lower extremity edema.  She was later diagnosed with liver cirrhosis, cholelithiasis and ascites.  She did undergo a paracentesis in the emergency department. She was admitted.   She reports that she is doing well overall.  She does suffer from weakness.  Her Foley catheter is in place.  She denies any problems with the catheter. She has a support of her 2 sons.  She does continue to smoke.  Past Medical History:  Diagnosis Date   Bell's palsy     Past Surgical History:  Procedure Laterality Date   CLAVICLE SURGERY     FRACTURE SURGERY      No family history on file.  Social History   Socioeconomic History   Marital status: Single    Spouse name: Not on file   Number of children: Not on file   Years of education: Not on file   Highest education level: Not on file  Occupational History   Not on file  Tobacco Use   Smoking status: Every Day    Types: Cigarettes   Smokeless tobacco: Never  Vaping Use   Vaping Use: Never used  Substance and Sexual Activity   Alcohol use: Not Currently    Comment: weekly   Drug use: Never   Sexual activity: Not on file  Other Topics Concern   Not on file  Social History Narrative   Not on file   Social Determinants of Health   Financial  Resource Strain: Not on file  Food Insecurity: Not on file  Transportation Needs: Not on file  Physical Activity: Not on file  Stress: Not on file  Social Connections: Not on file  Intimate Partner Violence: Not on file    ROS Review of Systems  Objective:   Today's Vitals: BP (!) 108/54   Pulse (!) 101   Temp (!) 97.5 F (36.4 C)   Ht 4\' 11"  (1.499 m)   Wt 104 lb (47.2 kg)   SpO2 100%   BMI 21.01 kg/m   Physical Exam Constitutional:      General: She is not in acute distress.    Appearance: She is not toxic-appearing.     Comments: fraile  HENT:     Head: Normocephalic and atraumatic.     Right Ear: Tympanic membrane normal.     Left Ear: Tympanic membrane normal.     Nose: Nose normal.     Mouth/Throat:     Mouth: Mucous membranes are moist.  Cardiovascular:     Rate and Rhythm: Regular rhythm. Tachycardia present.     Pulses: Normal pulses.     Heart sounds: Normal heart sounds.  Pulmonary:     Effort: Pulmonary effort is  normal.     Comments: Diminished Abdominal:     Comments: Increased abdominal girth   Musculoskeletal:        General: Normal range of motion.     Comments: wheelchair in use  Skin:    General: Skin is warm and dry.     Capillary Refill: Capillary refill takes less than 2 seconds.  Neurological:     General: No focal deficit present.     Mental Status: She is alert and oriented to person, place, and time.  Psychiatric:        Mood and Affect: Mood normal.        Behavior: Behavior normal.        Thought Content: Thought content normal.        Judgment: Judgment normal.    Assessment & Plan:   Problem List Items Addressed This Visit       Cardiovascular and Mediastinum   Acute systolic heart failure [facility]) Follow-up with cardiology   Relevant Orders   Ambulatory referral to Home Health     Genitourinary   AKI (acute kidney injury) [facility]) Follow-up with urology scheduled   Relevant Orders   Comp. Metabolic Panel (12)    Ambulatory referral to Home Health   Other Visit Diagnoses     Encounter to establish care    -  Primary Discussed female health maintenance;  Discussed general safety  Discussed regular hydration with water Discussed healthy diet  Discussed mental health Encouraged to call our office for an appointment with in ongoing concerns for questions.     Anemia, unspecified type    Further evaluation pending   Relevant Orders   CBC with Differential/Platelet    Ambulatory referral to Home Health       Outpatient Encounter Medications as of 05/27/2021  Medication Sig   [EXPIRED] cephALEXin (KEFLEX) 500 MG capsule Take 1 capsule (500 mg total) by mouth 3 (three) times daily for 4 days.   furosemide (LASIX) 40 MG tablet Take 1 tablet (40 mg total) by mouth daily.   potassium chloride SA (KLOR-CON) 20 MEQ tablet Take 1 tablet (20 mEq total) by mouth daily for 3 days.   spironolactone (ALDACTONE) 50 MG tablet Take 1 tablet (50 mg total) by mouth daily.   No facility-administered encounter medications on file as of 05/27/2021.    Follow-up: Return in about 3 months (around 08/27/2021).   Barbette Merino, NP

## 2021-05-28 LAB — COMP. METABOLIC PANEL (12)
AST: 77 IU/L — ABNORMAL HIGH (ref 0–40)
Albumin/Globulin Ratio: 0.4 — ABNORMAL LOW (ref 1.2–2.2)
Albumin: 2.4 g/dL — ABNORMAL LOW (ref 3.7–4.7)
Alkaline Phosphatase: 468 IU/L — ABNORMAL HIGH (ref 44–121)
BUN/Creatinine Ratio: 14 (ref 12–28)
BUN: 20 mg/dL (ref 8–27)
Bilirubin Total: 5.2 mg/dL — ABNORMAL HIGH (ref 0.0–1.2)
Calcium: 7.9 mg/dL — ABNORMAL LOW (ref 8.7–10.3)
Chloride: 93 mmol/L — ABNORMAL LOW (ref 96–106)
Creatinine, Ser: 1.47 mg/dL — ABNORMAL HIGH (ref 0.57–1.00)
Globulin, Total: 5.6 g/dL — ABNORMAL HIGH (ref 1.5–4.5)
Glucose: 92 mg/dL (ref 70–99)
Potassium: 4.2 mmol/L (ref 3.5–5.2)
Sodium: 132 mmol/L — ABNORMAL LOW (ref 134–144)
Total Protein: 8 g/dL (ref 6.0–8.5)
eGFR: 38 mL/min/{1.73_m2} — ABNORMAL LOW (ref >59)

## 2021-05-28 LAB — CBC WITH DIFFERENTIAL/PLATELET
Basophils Absolute: 0.1 10*3/uL (ref 0.0–0.2)
Basos: 0 %
EOS (ABSOLUTE): 0.3 10*3/uL (ref 0.0–0.4)
Eos: 2 %
Hematocrit: 22.7 % — ABNORMAL LOW (ref 34.0–46.6)
Hemoglobin: 8 g/dL — ABNORMAL LOW (ref 11.1–15.9)
Immature Grans (Abs): 0 10*3/uL (ref 0.0–0.1)
Immature Granulocytes: 0 %
Lymphocytes Absolute: 2.5 10*3/uL (ref 0.7–3.1)
Lymphs: 22 %
MCH: 31 pg (ref 26.6–33.0)
MCHC: 35.2 g/dL (ref 31.5–35.7)
MCV: 88 fL (ref 79–97)
Monocytes Absolute: 0.9 10*3/uL (ref 0.1–0.9)
Monocytes: 8 %
Neutrophils Absolute: 7.5 10*3/uL — ABNORMAL HIGH (ref 1.4–7.0)
Neutrophils: 68 %
Platelets: 221 10*3/uL (ref 150–450)
RBC: 2.58 x10E6/uL — CL (ref 3.77–5.28)
RDW: 15.8 % — ABNORMAL HIGH (ref 11.7–15.4)
WBC: 11.3 10*3/uL — ABNORMAL HIGH (ref 3.4–10.8)

## 2021-06-03 ENCOUNTER — Telehealth: Payer: Self-pay

## 2021-06-03 ENCOUNTER — Encounter (HOSPITAL_COMMUNITY): Payer: Self-pay | Admitting: Radiology

## 2021-06-03 NOTE — Telephone Encounter (Signed)
Debroah Loop from [facility] called requesting verbal order for Skilled Home Health nurse for the patient. Also wanted to know where patient was to be sent to for urology since she does have Foley catheter. I assume [facility] but did not see any information in her chart. Call back number [phone]

## 2021-06-12 ENCOUNTER — Telehealth: Payer: Self-pay | Admitting: Nurse Practitioner

## 2021-06-12 ENCOUNTER — Other Ambulatory Visit: Payer: Self-pay | Admitting: Nurse Practitioner

## 2021-06-12 DIAGNOSIS — N179 Acute kidney failure, unspecified: Secondary | ICD-10-CM

## 2021-06-12 MED ORDER — POTASSIUM CHLORIDE CRYS ER 20 MEQ PO TBCR: 20.0000 meq | EXTENDED_RELEASE_TABLET | Freq: Every day | ORAL | 0 refills | Status: DC

## 2021-06-12 MED ORDER — LIDOCAINE 5 % EX PTCH: 1.0000 | MEDICATED_PATCH | CUTANEOUS | 0 refills | Status: DC

## 2021-06-12 MED ORDER — FUROSEMIDE 40 MG PO TABS: 40.0000 mg | ORAL_TABLET | Freq: Every day | ORAL | 0 refills | Status: DC

## 2021-06-12 MED ORDER — SPIRONOLACTONE 50 MG PO TABS: 50.0000 mg | ORAL_TABLET | Freq: Every day | ORAL | 0 refills | Status: DC

## 2021-06-12 NOTE — Progress Notes (Signed)
   [facility] Patient [facility] [address] [place], [place]  [address] Phone:  [phone]   Fax:  [phone]  Spoke with the nurse and refilled her furosemide and spirolactone for one week along with potassium 20 mEq x 3 days.  She reports that patient has urology appointment on 06/18/2021.  She is unsure if Hannah Blake is taking the medication correctly.  Based on her discharge instructions she would have had enough enteral diuretics to last until her follow-up appointment.  She does report a weight gain per their scale of approximately 5 to 10 pounds.  Her weight in the office was 104.Denies headache, dizziness, visual changes, shortness of breath, dyspnea on exertion, chest pain, nausea, vomiting or any edema.    She continues to complain of upper back pain Salonpas past which was not effective.  Due to her acute renal failure and cirrhosis Lidoderm patch will be started for pain.  Patient was diagnosed with a small left pleural effusion and left basal atelectasis with possible interstitial edema.  She has completed a course of antibiotic therapy and again denies any symptoms.  Patient to follow-up with urology for voiding test on 1129.  Home health nurse to notify office if patient is unable to make this appointment so further evaluation can be completed

## 2021-06-12 NOTE — Telephone Encounter (Signed)
Heather from [facility] called  [phone] Reports patient weight is 106 Patient has no Lasix or Spironolactone in house and no prescription for these meds. Patient continues to have pain in mid-lower back and not using Salonpas because it was not helping. Can something else be prescribed? Heather needs nurse evaluation orders to continue seeing the patient. Uses [facility] on [address]

## 2021-06-21 ENCOUNTER — Telehealth: Payer: Self-pay

## 2021-06-21 NOTE — Telephone Encounter (Signed)
Herbert Seta from Urology Dept [phone]  [facility] that pt showed up to appt.  How ever she wanted you to call her about some med's (heather)

## 2021-06-26 NOTE — Telephone Encounter (Signed)
Spoke to [place] from Urology and she stated that when she saw the patient last week her weight was at 99lbs. Then the nurse that went on yesterday to see the patient her weight was at 93lbs. Heather doesn't think that the patient still needs the Lasix and Spironolactone medication but it's your judgement on that. Pt catheter was removed last week and the patient states that she is eating with no problems. Herbert Seta states that the patient has no swelling and her lungs have been clear.

## 2021-06-27 NOTE — Telephone Encounter (Signed)
Noted  

## 2021-07-01 DIAGNOSIS — G510 Bell's palsy: Secondary | ICD-10-CM | POA: Insufficient documentation

## 2021-07-02 NOTE — Progress Notes (Deleted)
Cardiology Office Note:    Date:  07/02/2021   ID:  Hannah Blake, DOB 1950/09/20, MRN [id number]  PCP:  Barbette Merino, NP  Cardiologist:  Norman Herrlich, MD    Referring MD: No ref. provider found    ASSESSMENT:    No diagnosis found. PLAN:    In order of problems listed above:  ***   Next appointment: ***   Medication Adjustments/Labs and Tests Ordered: Current medicines are reviewed at length with the patient today.  Concerns regarding medicines are outlined above.  No orders of the defined types were placed in this encounter.  No orders of the defined types were placed in this encounter.   No chief complaint on file.  .[facility]  History of Present Illness:    Hannah Blake is a 70 y.o. female with a hx of cardiomyopathy mild LV dysfunction EF 45 to 50% on echocardiogram and mild aortic regurgitation during recent admission with gross hematuria urinary retention and a CT of the abdomen showed the presence of hepatic cirrhosis ascites and cholelithiasis.  She was seen by cardiology during her admission.  Last seen by Dr. Gaynelle Arabian 05/23/2021 is commented that her heart failure was improved and she was continued on loop diuretic furosemide and Aldactone and hypokalemia with potassium supplementation.  She was not started on other therapy beta-blocker Entresto SGLT2 inhibitor. Compliance with diet, lifestyle and medications: *** Past Medical History:  Diagnosis Date   Bell's palsy     Past Surgical History:  Procedure Laterality Date   CLAVICLE SURGERY     FRACTURE SURGERY      Current Medications: No outpatient medications have been marked as taking for the 07/03/21 encounter (Appointment) with Baldo Daub, MD.     Allergies:   Patient has no known allergies.   Social History   Socioeconomic History   Marital status: Single    Spouse name: Not on file   Number of children: Not on file   Years of education: Not on file   Highest education level: Not on file   Occupational History   Not on file  Tobacco Use   Smoking status: Every Day    Types: Cigarettes   Smokeless tobacco: Never  Vaping Use   Vaping Use: Never used  Substance and Sexual Activity   Alcohol use: Not Currently    Comment: weekly   Drug use: Never   Sexual activity: Not on file  Other Topics Concern   Not on file  Social History Narrative   Not on file   Social Determinants of Health   Financial Resource Strain: Not on file  Food Insecurity: Not on file  Transportation Needs: Not on file  Physical Activity: Not on file  Stress: Not on file  Social Connections: Not on file     Family History: The patient's ***family history is not on file. ROS:   Please see the history of present illness.    All other systems reviewed and are negative.  EKGs/Labs/Other Studies Reviewed:    The following studies were reviewed today:  EKG:  EKG ordered today and personally reviewed.  The ekg ordered today demonstrates ***  Recent Labs: 05/20/2021: B Natriuretic Peptide 689.8 05/21/2021: ALT 21; Magnesium 2.2 05/27/2021: BUN 20; Creatinine, Ser 1.47; Hemoglobin 8.0; Platelets 221; Potassium 4.2; Sodium 132  Recent Lipid Panel No results found for: CHOL, TRIG, HDL, CHOLHDL, VLDL, LDLCALC, LDLDIRECT  Physical Exam:    VS:  There were no vitals taken for this  visit.    Wt Readings from Last 3 Encounters:  05/27/21 104 lb (47.2 kg)  05/23/21 130 lb 4.7 oz (59.1 kg)  05/01/21 123 lb (55.8 kg)     GEN: *** Well nourished, well developed in no acute distress HEENT: Normal NECK: No JVD; No carotid bruits LYMPHATICS: No lymphadenopathy CARDIAC: ***RRR, no murmurs, rubs, gallops RESPIRATORY:  Clear to auscultation without rales, wheezing or rhonchi  ABDOMEN: Soft, non-tender, non-distended MUSCULOSKELETAL:  No edema; No deformity  SKIN: Warm and dry NEUROLOGIC:  Alert and oriented x 3 PSYCHIATRIC:  Normal affect    Signed, Shirlee More, MD  07/02/2021 1:41 PM     [facility]

## 2021-07-03 ENCOUNTER — Ambulatory Visit: Payer: Medicaid Other | Admitting: Cardiology

## 2021-08-26 ENCOUNTER — Ambulatory Visit: Payer: Medicaid Other | Admitting: Nurse Practitioner

## 2022-01-12 ENCOUNTER — Inpatient Hospital Stay (HOSPITAL_COMMUNITY)
Admission: EM | Admit: 2022-01-12 | Discharge: 2022-01-17 | DRG: 432 | Disposition: A | Discharge status: Home Health | Payer: Medicare Other | Attending: Internal Medicine | Admitting: Internal Medicine

## 2022-01-12 ENCOUNTER — Emergency Department (HOSPITAL_COMMUNITY): Payer: Medicare Other

## 2022-01-12 ENCOUNTER — Other Ambulatory Visit: Payer: Self-pay

## 2022-01-12 ENCOUNTER — Encounter (HOSPITAL_COMMUNITY): Payer: Self-pay | Admitting: Emergency Medicine

## 2022-01-12 DIAGNOSIS — I8510 Secondary esophageal varices without bleeding: Secondary | ICD-10-CM | POA: Diagnosis present

## 2022-01-12 DIAGNOSIS — I5022 Chronic systolic (congestive) heart failure: Secondary | ICD-10-CM | POA: Diagnosis present

## 2022-01-12 DIAGNOSIS — N179 Acute kidney failure, unspecified: Secondary | ICD-10-CM | POA: Diagnosis present

## 2022-01-12 DIAGNOSIS — E875 Hyperkalemia: Secondary | ICD-10-CM | POA: Diagnosis not present

## 2022-01-12 DIAGNOSIS — Z0000 Encounter for general adult medical examination without abnormal findings: Secondary | ICD-10-CM

## 2022-01-12 DIAGNOSIS — E46 Unspecified protein-calorie malnutrition: Secondary | ICD-10-CM | POA: Diagnosis present

## 2022-01-12 DIAGNOSIS — F17210 Nicotine dependence, cigarettes, uncomplicated: Secondary | ICD-10-CM | POA: Diagnosis present

## 2022-01-12 DIAGNOSIS — E8770 Fluid overload, unspecified: Secondary | ICD-10-CM | POA: Diagnosis present

## 2022-01-12 DIAGNOSIS — D62 Acute posthemorrhagic anemia: Secondary | ICD-10-CM | POA: Diagnosis present

## 2022-01-12 DIAGNOSIS — K743 Primary biliary cirrhosis: Secondary | ICD-10-CM | POA: Diagnosis not present

## 2022-01-12 DIAGNOSIS — K259 Gastric ulcer, unspecified as acute or chronic, without hemorrhage or perforation: Secondary | ICD-10-CM

## 2022-01-12 DIAGNOSIS — K7290 Hepatic failure, unspecified without coma: Secondary | ICD-10-CM

## 2022-01-12 DIAGNOSIS — N1832 Chronic kidney disease, stage 3b: Secondary | ICD-10-CM | POA: Diagnosis present

## 2022-01-12 DIAGNOSIS — Z7189 Other specified counseling: Secondary | ICD-10-CM

## 2022-01-12 DIAGNOSIS — D649 Anemia, unspecified: Principal | ICD-10-CM

## 2022-01-12 DIAGNOSIS — E43 Unspecified severe protein-calorie malnutrition: Secondary | ICD-10-CM | POA: Diagnosis present

## 2022-01-12 DIAGNOSIS — K219 Gastro-esophageal reflux disease without esophagitis: Secondary | ICD-10-CM

## 2022-01-12 DIAGNOSIS — K254 Chronic or unspecified gastric ulcer with hemorrhage: Secondary | ICD-10-CM | POA: Diagnosis present

## 2022-01-12 DIAGNOSIS — R188 Other ascites: Secondary | ICD-10-CM

## 2022-01-12 DIAGNOSIS — K7460 Unspecified cirrhosis of liver: Secondary | ICD-10-CM

## 2022-01-12 DIAGNOSIS — K922 Gastrointestinal hemorrhage, unspecified: Secondary | ICD-10-CM | POA: Diagnosis present

## 2022-01-12 DIAGNOSIS — I5020 Unspecified systolic (congestive) heart failure: Secondary | ICD-10-CM

## 2022-01-12 DIAGNOSIS — Z6820 Body mass index (BMI) 20.0-20.9, adult: Secondary | ICD-10-CM

## 2022-01-12 DIAGNOSIS — I2510 Atherosclerotic heart disease of native coronary artery without angina pectoris: Secondary | ICD-10-CM | POA: Diagnosis present

## 2022-01-12 HISTORY — DX: Unspecified cirrhosis of liver: K74.60

## 2022-01-12 HISTORY — DX: Atherosclerotic heart disease of native coronary artery without angina pectoris: I25.10

## 2022-01-12 LAB — COMPREHENSIVE METABOLIC PANEL
ALT: 34 U/L (ref 0–44)
AST: 71 U/L — ABNORMAL HIGH (ref 15–41)
Albumin: 2.1 g/dL — ABNORMAL LOW (ref 3.5–5.0)
Alkaline Phosphatase: 433 U/L — ABNORMAL HIGH (ref 38–126)
Anion gap: 13 (ref 5–15)
BUN: 39 mg/dL — ABNORMAL HIGH (ref 8–23)
CO2: 18 mmol/L — ABNORMAL LOW (ref 22–32)
Calcium: 8.5 mg/dL — ABNORMAL LOW (ref 8.9–10.3)
Chloride: 104 mmol/L (ref 98–111)
Creatinine, Ser: 1.3 mg/dL — ABNORMAL HIGH (ref 0.44–1.00)
GFR, Estimated: 44 mL/min — ABNORMAL LOW (ref >60)
Glucose, Bld: 161 mg/dL — ABNORMAL HIGH (ref 70–99)
Potassium: 5.7 mmol/L — ABNORMAL HIGH (ref 3.5–5.1)
Sodium: 135 mmol/L (ref 135–145)
Total Bilirubin: 2.5 mg/dL — ABNORMAL HIGH (ref 0.3–1.2)
Total Protein: 6.9 g/dL (ref 6.5–8.1)

## 2022-01-12 LAB — ABO/RH: ABO/RH(D): B POS

## 2022-01-12 LAB — ALBUMIN, PLEURAL OR PERITONEAL FLUID: Albumin, Fluid: <1.5 g/dL

## 2022-01-12 LAB — GLUCOSE, PLEURAL OR PERITONEAL FLUID: Glucose, Fluid: 112 mg/dL

## 2022-01-12 LAB — PREPARE RBC (CROSSMATCH)

## 2022-01-12 LAB — CBC WITH DIFFERENTIAL/PLATELET
Abs Immature Granulocytes: 0.08 10*3/uL — ABNORMAL HIGH (ref 0.00–0.07)
Basophils Absolute: 0 10*3/uL (ref 0.0–0.1)
Basophils Relative: 0 %
Eosinophils Absolute: 0.1 10*3/uL (ref 0.0–0.5)
Eosinophils Relative: 1 %
HCT: 22.3 % — ABNORMAL LOW (ref 36.0–46.0)
Hemoglobin: 6.6 g/dL — CL (ref 12.0–15.0)
Immature Granulocytes: 1 %
Lymphocytes Relative: 13 %
Lymphs Abs: 1.9 10*3/uL (ref 0.7–4.0)
MCH: 25.6 pg — ABNORMAL LOW (ref 26.0–34.0)
MCHC: 29.6 g/dL — ABNORMAL LOW (ref 30.0–36.0)
MCV: 86.4 fL (ref 80.0–100.0)
Monocytes Absolute: 0.7 10*3/uL (ref 0.1–1.0)
Monocytes Relative: 5 %
Neutro Abs: 11.5 10*3/uL — ABNORMAL HIGH (ref 1.7–7.7)
Neutrophils Relative %: 80 %
Platelets: 323 10*3/uL (ref 150–400)
RBC: 2.58 MIL/uL — ABNORMAL LOW (ref 3.87–5.11)
RDW: 17.2 % — ABNORMAL HIGH (ref 11.5–15.5)
WBC: 14.3 10*3/uL — ABNORMAL HIGH (ref 4.0–10.5)
nRBC: 0 % (ref 0.0–0.2)

## 2022-01-12 LAB — IRON AND TIBC
Iron: 23 ug/dL — ABNORMAL LOW (ref 28–170)
Saturation Ratios: 4 % — ABNORMAL LOW (ref 10.4–31.8)
TIBC: 522 ug/dL — ABNORMAL HIGH (ref 250–450)
UIBC: 499 ug/dL

## 2022-01-12 LAB — RETICULOCYTES
Immature Retic Fract: 29.4 % — ABNORMAL HIGH (ref 2.3–15.9)
RBC.: 2.47 MIL/uL — ABNORMAL LOW (ref 3.87–5.11)
Retic Count, Absolute: 79.3 10*3/uL (ref 19.0–186.0)
Retic Ct Pct: 3.2 % — ABNORMAL HIGH (ref 0.4–3.1)

## 2022-01-12 LAB — VITAMIN B12: Vitamin B-12: 889 pg/mL (ref 180–914)

## 2022-01-12 LAB — LACTATE DEHYDROGENASE, PLEURAL OR PERITONEAL FLUID: LD, Fluid: 49 U/L — ABNORMAL HIGH (ref 3–23)

## 2022-01-12 LAB — PROTIME-INR
INR: 1.2 (ref 0.8–1.2)
Prothrombin Time: 14.7 seconds (ref 11.4–15.2)

## 2022-01-12 LAB — BLOOD GAS, VENOUS
Acid-base deficit: 2.5 mmol/L — ABNORMAL HIGH (ref 0.0–2.0)
Bicarbonate: 22.5 mmol/L (ref 20.0–28.0)
O2 Saturation: 49.2 %
Patient temperature: 37.1
pCO2, Ven: 39 mmHg — ABNORMAL LOW (ref 44–60)
pH, Ven: 7.37 (ref 7.25–7.43)
pO2, Ven: <31 mmHg — CL (ref 32–45)

## 2022-01-12 LAB — ACETAMINOPHEN LEVEL: Acetaminophen (Tylenol), Serum: <10 ug/mL — ABNORMAL LOW (ref 10–30)

## 2022-01-12 LAB — FOLATE: Folate: 27.6 ng/mL (ref >5.9)

## 2022-01-12 LAB — PROTEIN, PLEURAL OR PERITONEAL FLUID: Total protein, fluid: 3.3 g/dL

## 2022-01-12 LAB — FERRITIN: Ferritin: 16 ng/mL (ref 11–307)

## 2022-01-12 LAB — LACTATE DEHYDROGENASE: LDH: 169 U/L (ref 98–192)

## 2022-01-12 LAB — LACTIC ACID, PLASMA
Lactic Acid, Venous: 5.7 mmol/L (ref 0.5–1.9)
Lactic Acid, Venous: 6.2 mmol/L (ref 0.5–1.9)

## 2022-01-12 LAB — AMMONIA: Ammonia: 47 umol/L — ABNORMAL HIGH (ref 9–35)

## 2022-01-12 MED ORDER — METRONIDAZOLE 500 MG/100ML IV SOLN
500.0000 mg | Freq: Two times a day (BID) | INTRAVENOUS | Status: DC
  Administered 2022-01-12 – 2022-01-17 (×11): 500 mg via INTRAVENOUS
  Filled 2022-01-12 (×10): qty 100

## 2022-01-12 MED ORDER — LIDOCAINE HCL 2 % IJ SOLN
10.0000 mL | Freq: Once | INTRAMUSCULAR | Status: AC
  Administered 2022-01-12: 200 mg

## 2022-01-12 MED ORDER — SODIUM CHLORIDE 0.9 % IV SOLN
50.0000 ug/h | INTRAVENOUS | Status: DC
  Administered 2022-01-12 – 2022-01-16 (×8): 50 ug/h via INTRAVENOUS
  Filled 2022-01-12 (×12): qty 1

## 2022-01-12 MED ORDER — ALBUMIN HUMAN 25 % IV SOLN
12.5000 g | Freq: Once | INTRAVENOUS | Status: AC
Start: 2022-01-13 — End: 2022-01-13
  Administered 2022-01-13: 12.5 g via INTRAVENOUS
  Filled 2022-01-12: qty 50

## 2022-01-12 MED ORDER — SODIUM ZIRCONIUM CYCLOSILICATE 10 G PO PACK
10.0000 g | PACK | Freq: Once | ORAL | Status: AC
Start: 2022-01-12 — End: 2022-01-12
  Administered 2022-01-12: 10 g via ORAL
  Filled 2022-01-12: qty 1

## 2022-01-12 MED ORDER — SODIUM CHLORIDE 0.9 % IV SOLN
10.0000 mL/h | Freq: Once | INTRAVENOUS | Status: AC
  Administered 2022-01-13: 10 mL/h via INTRAVENOUS

## 2022-01-12 MED ORDER — PANTOPRAZOLE INFUSION (NEW) - SIMPLE MED
8.0000 mg/h | INTRAVENOUS | Status: AC
  Administered 2022-01-12 – 2022-01-15 (×7): 8 mg/h via INTRAVENOUS
  Filled 2022-01-12: qty 100
  Filled 2022-01-12: qty 80
  Filled 2022-01-12: qty 100
  Filled 2022-01-12 (×6): qty 80

## 2022-01-12 MED ORDER — PANTOPRAZOLE SODIUM 40 MG IV SOLR
40.0000 mg | Freq: Two times a day (BID) | INTRAVENOUS | Status: DC
  Administered 2022-01-16 – 2022-01-17 (×3): 40 mg via INTRAVENOUS
  Filled 2022-01-12 (×3): qty 10

## 2022-01-12 MED ORDER — PANTOPRAZOLE 80MG IVPB - SIMPLE MED
80.0000 mg | Freq: Once | INTRAVENOUS | Status: AC
  Administered 2022-01-12: 80 mg via INTRAVENOUS
  Filled 2022-01-12: qty 80

## 2022-01-12 MED ORDER — CALCIUM GLUCONATE-NACL 1-0.675 GM/50ML-% IV SOLN
1.0000 g | Freq: Once | INTRAVENOUS | Status: AC
  Administered 2022-01-12: 1000 mg via INTRAVENOUS
  Filled 2022-01-12: qty 50

## 2022-01-12 MED ORDER — SODIUM CHLORIDE 0.9 % IV SOLN
1.0000 g | Freq: Once | INTRAVENOUS | Status: AC
  Administered 2022-01-12: 1 g via INTRAVENOUS
  Filled 2022-01-12: qty 10

## 2022-01-12 MED ORDER — OCTREOTIDE LOAD VIA INFUSION
50.0000 ug | Freq: Once | INTRAVENOUS | Status: AC
  Administered 2022-01-12: 50 ug via INTRAVENOUS
  Filled 2022-01-12: qty 25

## 2022-01-12 MED ORDER — LACTATED RINGERS IV BOLUS
1000.0000 mL | Freq: Once | INTRAVENOUS | Status: AC
  Administered 2022-01-12: 1000 mL via INTRAVENOUS

## 2022-01-12 MED ORDER — ALBUMIN HUMAN 25 % IV SOLN
12.5000 g | Freq: Once | INTRAVENOUS | Status: AC
  Administered 2022-01-12: 12.5 g via INTRAVENOUS
  Filled 2022-01-12: qty 50

## 2022-01-13 ENCOUNTER — Encounter (HOSPITAL_COMMUNITY): Payer: Self-pay | Admitting: Internal Medicine

## 2022-01-13 ENCOUNTER — Inpatient Hospital Stay (HOSPITAL_COMMUNITY): Payer: Medicare Other

## 2022-01-13 ENCOUNTER — Emergency Department (HOSPITAL_COMMUNITY): Payer: Medicare Other

## 2022-01-13 DIAGNOSIS — E8770 Fluid overload, unspecified: Secondary | ICD-10-CM | POA: Diagnosis present

## 2022-01-13 DIAGNOSIS — D62 Acute posthemorrhagic anemia: Secondary | ICD-10-CM | POA: Diagnosis present

## 2022-01-13 DIAGNOSIS — K743 Primary biliary cirrhosis: Secondary | ICD-10-CM | POA: Diagnosis present

## 2022-01-13 DIAGNOSIS — I2510 Atherosclerotic heart disease of native coronary artery without angina pectoris: Secondary | ICD-10-CM | POA: Diagnosis present

## 2022-01-13 DIAGNOSIS — R188 Other ascites: Secondary | ICD-10-CM

## 2022-01-13 DIAGNOSIS — K7460 Unspecified cirrhosis of liver: Secondary | ICD-10-CM | POA: Diagnosis present

## 2022-01-13 DIAGNOSIS — N1832 Chronic kidney disease, stage 3b: Secondary | ICD-10-CM | POA: Diagnosis present

## 2022-01-13 DIAGNOSIS — K254 Chronic or unspecified gastric ulcer with hemorrhage: Secondary | ICD-10-CM | POA: Diagnosis present

## 2022-01-13 DIAGNOSIS — Z6820 Body mass index (BMI) 20.0-20.9, adult: Secondary | ICD-10-CM | POA: Diagnosis not present

## 2022-01-13 DIAGNOSIS — I8510 Secondary esophageal varices without bleeding: Secondary | ICD-10-CM | POA: Diagnosis present

## 2022-01-13 DIAGNOSIS — N179 Acute kidney failure, unspecified: Secondary | ICD-10-CM | POA: Diagnosis present

## 2022-01-13 DIAGNOSIS — F17210 Nicotine dependence, cigarettes, uncomplicated: Secondary | ICD-10-CM | POA: Diagnosis present

## 2022-01-13 DIAGNOSIS — E43 Unspecified severe protein-calorie malnutrition: Secondary | ICD-10-CM | POA: Diagnosis present

## 2022-01-13 DIAGNOSIS — I5022 Chronic systolic (congestive) heart failure: Secondary | ICD-10-CM | POA: Diagnosis present

## 2022-01-13 DIAGNOSIS — E875 Hyperkalemia: Secondary | ICD-10-CM | POA: Diagnosis present

## 2022-01-13 DIAGNOSIS — K7290 Hepatic failure, unspecified without coma: Secondary | ICD-10-CM

## 2022-01-13 DIAGNOSIS — K922 Gastrointestinal hemorrhage, unspecified: Secondary | ICD-10-CM | POA: Diagnosis present

## 2022-01-13 LAB — GLUCOSE, CAPILLARY
Glucose-Capillary: 125 mg/dL — ABNORMAL HIGH (ref 70–99)
Glucose-Capillary: 119 mg/dL — ABNORMAL HIGH (ref 70–99)
Glucose-Capillary: 154 mg/dL — ABNORMAL HIGH (ref 70–99)

## 2022-01-13 LAB — CBC
HCT: 20.2 % — ABNORMAL LOW (ref 36.0–46.0)
Hemoglobin: 6.1 g/dL — CL (ref 12.0–15.0)
MCH: 25.6 pg — ABNORMAL LOW (ref 26.0–34.0)
MCHC: 30.2 g/dL (ref 30.0–36.0)
MCV: 84.9 fL (ref 80.0–100.0)
Platelets: 167 10*3/uL (ref 150–400)
RBC: 2.38 MIL/uL — ABNORMAL LOW (ref 3.87–5.11)
RDW: 16.5 % — ABNORMAL HIGH (ref 11.5–15.5)
WBC: 9.6 10*3/uL (ref 4.0–10.5)
nRBC: 0 % (ref 0.0–0.2)

## 2022-01-13 LAB — GRAM STAIN

## 2022-01-13 LAB — BODY FLUID CELL COUNT WITH DIFFERENTIAL
Eos, Fluid: 0 %
Lymphs, Fluid: 52 %
Monocyte-Macrophage-Serous Fluid: 47 % — ABNORMAL LOW (ref 50–90)
Neutrophil Count, Fluid: 1 % (ref 0–25)
Total Nucleated Cell Count, Fluid: 118 cu mm (ref 0–1000)

## 2022-01-13 LAB — HEPATIC FUNCTION PANEL
ALT: 31 U/L (ref 0–44)
AST: 57 U/L — ABNORMAL HIGH (ref 15–41)
Albumin: 2.9 g/dL — ABNORMAL LOW (ref 3.5–5.0)
Alkaline Phosphatase: 336 U/L — ABNORMAL HIGH (ref 38–126)
Bilirubin, Direct: 1.5 mg/dL — ABNORMAL HIGH (ref 0.0–0.2)
Indirect Bilirubin: 1.5 mg/dL — ABNORMAL HIGH (ref 0.3–0.9)
Total Bilirubin: 3 mg/dL — ABNORMAL HIGH (ref 0.3–1.2)
Total Protein: 6.6 g/dL (ref 6.5–8.1)

## 2022-01-13 LAB — HEMOGLOBIN AND HEMATOCRIT, BLOOD
HCT: 25.9 % — ABNORMAL LOW (ref 36.0–46.0)
Hemoglobin: 8.3 g/dL — ABNORMAL LOW (ref 12.0–15.0)

## 2022-01-13 LAB — ALBUMIN, PLEURAL OR PERITONEAL FLUID: Albumin, Fluid: <1.5 g/dL

## 2022-01-13 LAB — MRSA NEXT GEN BY PCR, NASAL: MRSA by PCR Next Gen: NOT DETECTED

## 2022-01-13 LAB — BASIC METABOLIC PANEL
Anion gap: 7 (ref 5–15)
BUN: 49 mg/dL — ABNORMAL HIGH (ref 8–23)
CO2: 23 mmol/L (ref 22–32)
Calcium: 8.6 mg/dL — ABNORMAL LOW (ref 8.9–10.3)
Chloride: 106 mmol/L (ref 98–111)
Creatinine, Ser: 1 mg/dL (ref 0.44–1.00)
GFR, Estimated: >60 mL/min (ref >60)
Glucose, Bld: 129 mg/dL — ABNORMAL HIGH (ref 70–99)
Potassium: 5.5 mmol/L — ABNORMAL HIGH (ref 3.5–5.1)
Sodium: 136 mmol/L (ref 135–145)

## 2022-01-13 LAB — PROTEIN, PLEURAL OR PERITONEAL FLUID: Total protein, fluid: 3.3 g/dL

## 2022-01-13 LAB — LACTIC ACID, PLASMA: Lactic Acid, Venous: 2.6 mmol/L (ref 0.5–1.9)

## 2022-01-13 LAB — PREPARE RBC (CROSSMATCH)

## 2022-01-13 MED ORDER — SODIUM CHLORIDE (PF) 0.9 % IJ SOLN
INTRAMUSCULAR | Status: AC
  Filled 2022-01-13: qty 50

## 2022-01-13 MED ORDER — ALBUMIN HUMAN 25 % IV SOLN
25.0000 g | Freq: Four times a day (QID) | INTRAVENOUS | Status: AC
  Administered 2022-01-13 – 2022-01-14 (×2): 25 g via INTRAVENOUS
  Filled 2022-01-13 (×2): qty 100

## 2022-01-13 MED ORDER — ORAL CARE MOUTH RINSE: 15.0000 mL | OROMUCOSAL | Status: DC | PRN

## 2022-01-13 MED ORDER — ENSURE ENLIVE PO LIQD: 237.0000 mL | Freq: Two times a day (BID) | ORAL | Status: DC

## 2022-01-13 MED ORDER — FUROSEMIDE 10 MG/ML IJ SOLN: 40.0000 mg | Freq: Once | INTRAMUSCULAR | Status: DC

## 2022-01-13 MED ORDER — ALBUMIN HUMAN 25 % IV SOLN
25.0000 g | Freq: Once | INTRAVENOUS | Status: AC
  Administered 2022-01-13: 25 g via INTRAVENOUS
  Filled 2022-01-13: qty 100

## 2022-01-13 MED ORDER — SODIUM CHLORIDE 0.9 % IV SOLN
2.0000 g | INTRAVENOUS | Status: AC
  Administered 2022-01-13 – 2022-01-17 (×5): 2 g via INTRAVENOUS
  Filled 2022-01-13 (×5): qty 20

## 2022-01-13 MED ORDER — IOHEXOL 300 MG/ML  SOLN
75.0000 mL | Freq: Once | INTRAMUSCULAR | Status: AC | PRN
  Administered 2022-01-13: 75 mL via INTRAVENOUS

## 2022-01-13 MED ORDER — CHLORHEXIDINE GLUCONATE CLOTH 2 % EX PADS
6.0000 | MEDICATED_PAD | Freq: Every day | CUTANEOUS | Status: DC
  Administered 2022-01-13 – 2022-01-15 (×3): 6 via TOPICAL

## 2022-01-13 MED ORDER — SODIUM CHLORIDE 0.9% IV SOLUTION: Freq: Once | INTRAVENOUS | Status: AC

## 2022-01-13 MED ORDER — SODIUM CHLORIDE 0.9 % IV SOLN: INTRAVENOUS | Status: DC

## 2022-01-13 MED ORDER — PROSOURCE PLUS PO LIQD: 30.0000 mL | Freq: Two times a day (BID) | ORAL | Status: DC

## 2022-01-13 MED ORDER — PROSOURCE PLUS PO LIQD
30.0000 mL | Freq: Two times a day (BID) | ORAL | Status: DC
  Administered 2022-01-13 – 2022-01-17 (×4): 30 mL via ORAL
  Filled 2022-01-13 (×4): qty 30

## 2022-01-13 MED ORDER — LIDOCAINE HCL 1 % IJ SOLN
INTRAMUSCULAR | Status: AC
  Filled 2022-01-13: qty 20

## 2022-01-13 MED ORDER — DIPHENHYDRAMINE HCL 25 MG PO CAPS
25.0000 mg | ORAL_CAPSULE | Freq: Four times a day (QID) | ORAL | Status: DC | PRN
  Administered 2022-01-13 – 2022-01-17 (×4): 25 mg via ORAL
  Filled 2022-01-13 (×4): qty 1

## 2022-01-13 MED ORDER — ENSURE ENLIVE PO LIQD
237.0000 mL | Freq: Two times a day (BID) | ORAL | Status: DC
  Administered 2022-01-13 – 2022-01-17 (×7): 237 mL via ORAL

## 2022-01-13 MED ORDER — ADULT MULTIVITAMIN W/MINERALS CH
1.0000 | ORAL_TABLET | Freq: Every day | ORAL | Status: DC
  Administered 2022-01-13 – 2022-01-17 (×4): 1 via ORAL
  Filled 2022-01-13 (×4): qty 1

## 2022-01-13 NOTE — Progress Notes (Signed)
Patient transferred to ICU with charge and RRT.  Bedside report given.  Family and belongings at bedside.

## 2022-01-13 NOTE — Progress Notes (Signed)
Patient up from ED with grandson at bedside.  Pt with audible wheezes and unable to lay on back. Already received 1 unit PRBC.  Pt requesting to have BM.  Placed on [facility] and patient had large bloody/clotted BM.  Dr Kirtland Bouchard notified.  Repeat VS check showed stable BP after.

## 2022-01-14 ENCOUNTER — Inpatient Hospital Stay (HOSPITAL_COMMUNITY): Payer: Medicare Other | Admitting: Certified Registered"

## 2022-01-14 ENCOUNTER — Encounter (HOSPITAL_COMMUNITY): Payer: Self-pay | Admitting: Internal Medicine

## 2022-01-14 ENCOUNTER — Encounter (HOSPITAL_COMMUNITY)
Admission: EM | Disposition: A | Discharge status: Home Health | Payer: Self-pay | Source: Home / Self Care | Attending: Internal Medicine

## 2022-01-14 DIAGNOSIS — D649 Anemia, unspecified: Secondary | ICD-10-CM

## 2022-01-14 DIAGNOSIS — I8500 Esophageal varices without bleeding: Secondary | ICD-10-CM

## 2022-01-14 DIAGNOSIS — K7460 Unspecified cirrhosis of liver: Secondary | ICD-10-CM

## 2022-01-14 DIAGNOSIS — K259 Gastric ulcer, unspecified as acute or chronic, without hemorrhage or perforation: Secondary | ICD-10-CM

## 2022-01-14 HISTORY — PX: ESOPHAGEAL BANDING: SHX5518

## 2022-01-14 HISTORY — PX: BIOPSY: SHX5522

## 2022-01-14 HISTORY — PX: ESOPHAGOGASTRODUODENOSCOPY: SHX5428

## 2022-01-14 LAB — TYPE AND SCREEN
ABO/RH(D): B POS
Antibody Screen: NEGATIVE
Unit division: 0
Unit division: 0
Unit division: 0

## 2022-01-14 LAB — COMPREHENSIVE METABOLIC PANEL
ALT: 27 U/L (ref 0–44)
AST: 51 U/L — ABNORMAL HIGH (ref 15–41)
Albumin: 3 g/dL — ABNORMAL LOW (ref 3.5–5.0)
Alkaline Phosphatase: 275 U/L — ABNORMAL HIGH (ref 38–126)
Anion gap: 8 (ref 5–15)
BUN: 35 mg/dL — ABNORMAL HIGH (ref 8–23)
CO2: 23 mmol/L (ref 22–32)
Calcium: 8.5 mg/dL — ABNORMAL LOW (ref 8.9–10.3)
Chloride: 108 mmol/L (ref 98–111)
Creatinine, Ser: 0.7 mg/dL (ref 0.44–1.00)
GFR, Estimated: >60 mL/min (ref >60)
Glucose, Bld: 106 mg/dL — ABNORMAL HIGH (ref 70–99)
Potassium: 4.6 mmol/L (ref 3.5–5.1)
Sodium: 139 mmol/L (ref 135–145)
Total Bilirubin: 3 mg/dL — ABNORMAL HIGH (ref 0.3–1.2)
Total Protein: 6.3 g/dL — ABNORMAL LOW (ref 6.5–8.1)

## 2022-01-14 LAB — PATHOLOGIST SMEAR REVIEW

## 2022-01-14 LAB — CBC
HCT: 27.8 % — ABNORMAL LOW (ref 36.0–46.0)
Hemoglobin: 8.7 g/dL — ABNORMAL LOW (ref 12.0–15.0)
MCH: 26 pg (ref 26.0–34.0)
MCHC: 31.3 g/dL (ref 30.0–36.0)
MCV: 83.2 fL (ref 80.0–100.0)
Platelets: 156 K/uL (ref 150–400)
RBC: 3.34 MIL/uL — ABNORMAL LOW (ref 3.87–5.11)
RDW: 16.3 % — ABNORMAL HIGH (ref 11.5–15.5)
WBC: 8.3 K/uL (ref 4.0–10.5)
nRBC: 0.2 % (ref 0.0–0.2)

## 2022-01-14 LAB — GLUCOSE, CAPILLARY
Glucose-Capillary: 111 mg/dL — ABNORMAL HIGH (ref 70–99)
Glucose-Capillary: 172 mg/dL — ABNORMAL HIGH (ref 70–99)
Glucose-Capillary: 92 mg/dL (ref 70–99)
Glucose-Capillary: 99 mg/dL (ref 70–99)
Glucose-Capillary: 99 mg/dL (ref 70–99)

## 2022-01-14 LAB — BPAM RBC
Blood Product Expiration Date: 202307032359
Blood Product Expiration Date: 202307152359
Blood Product Expiration Date: 202307162359
ISSUE DATE / TIME: 202306260106
ISSUE DATE / TIME: 202306261111
ISSUE DATE / TIME: 202306261703
Unit Type and Rh: 1700
Unit Type and Rh: 7300
Unit Type and Rh: 7300

## 2022-01-14 LAB — VITAMIN B12: Vitamin B-12: 747 pg/mL (ref 180–914)

## 2022-01-14 LAB — HEMOGLOBIN AND HEMATOCRIT, BLOOD
HCT: 29.5 % — ABNORMAL LOW (ref 36.0–46.0)
Hemoglobin: 9.1 g/dL — ABNORMAL LOW (ref 12.0–15.0)

## 2022-01-14 LAB — VITAMIN D 25 HYDROXY (VIT D DEFICIENCY, FRACTURES): Vit D, 25-Hydroxy: 37 ng/mL (ref 30–100)

## 2022-01-14 SURGERY — EGD (ESOPHAGOGASTRODUODENOSCOPY)
Anesthesia: Monitor Anesthesia Care

## 2022-01-14 MED ORDER — LIDOCAINE 2% (20 MG/ML) 5 ML SYRINGE
INTRAMUSCULAR | Status: DC | PRN
  Administered 2022-01-14: 40 mg via INTRAVENOUS

## 2022-01-14 MED ORDER — PROPOFOL 500 MG/50ML IV EMUL
INTRAVENOUS | Status: DC | PRN
  Administered 2022-01-14: 45 ug/kg/min via INTRAVENOUS

## 2022-01-14 MED ORDER — LORAZEPAM 0.5 MG PO TABS
0.5000 mg | ORAL_TABLET | Freq: Every day | ORAL | Status: DC
  Administered 2022-01-14 – 2022-01-16 (×3): 0.5 mg via ORAL
  Filled 2022-01-14 (×3): qty 1

## 2022-01-14 MED ORDER — LACTATED RINGERS IV SOLN: INTRAVENOUS | Status: DC

## 2022-01-14 MED ORDER — PROPOFOL 10 MG/ML IV BOLUS
INTRAVENOUS | Status: DC | PRN
  Administered 2022-01-14: 30 mg via INTRAVENOUS

## 2022-01-14 NOTE — Brief Op Note (Signed)
01/12/2022 - 01/14/2022  11:33 AM  PATIENT:  Hannah Blake  71 y.o. female  PRE-OPERATIVE DIAGNOSIS:  Hematemesis, acute blood loss anemia, cirrhosis  POST-OPERATIVE DIAGNOSIS:  esophageal varices, gastric ulcer  PROCEDURE:  Procedure(s): ESOPHAGOGASTRODUODENOSCOPY (EGD) (N/A) BIOPSY ESOPHAGEAL BANDING  SURGEON:  Surgeon(s) and Role:    * Lavonia Eager, MD - Primary  Findings ------------- -EGD showed multiple clean-based gastric ulcers as well as large esophageal varices.  2 bands placed.  Gastric biopsies performed.  Recommendations -------------------------- - continue current management with antibiotics, Protonix and octreotide -Monitor H&H -Okay to have full liquid diet today -GI will follow  Kathi Der MD, FACP 01/14/2022, 11:34 AM  Contact #  [phone]

## 2022-01-14 NOTE — Transfer of Care (Signed)
Immediate Anesthesia Transfer of Care Note  Patient: Hannah Blake  Procedure(s) Performed: ESOPHAGOGASTRODUODENOSCOPY (EGD) BIOPSY ESOPHAGEAL BANDING  Patient Location: PACU  Anesthesia Type:MAC  Level of Consciousness: sedated  Airway & Oxygen Therapy: Patient Spontanous Breathing and Patient connected to face mask oxygen  Post-op Assessment: Report given to RN and Post -op Vital signs reviewed and stable  Post vital signs: Reviewed and stable  Last Vitals:  Vitals Value Taken Time  BP    Temp    Pulse    Resp    SpO2      Last Pain:  Vitals:   01/14/22 1020  TempSrc: Temporal  PainSc: 0-No pain         Complications: No notable events documented.

## 2022-01-15 ENCOUNTER — Encounter (HOSPITAL_COMMUNITY): Payer: Self-pay | Admitting: Gastroenterology

## 2022-01-15 ENCOUNTER — Inpatient Hospital Stay (HOSPITAL_COMMUNITY): Payer: Medicare Other

## 2022-01-15 DIAGNOSIS — I8510 Secondary esophageal varices without bleeding: Secondary | ICD-10-CM

## 2022-01-15 DIAGNOSIS — K259 Gastric ulcer, unspecified as acute or chronic, without hemorrhage or perforation: Secondary | ICD-10-CM

## 2022-01-15 DIAGNOSIS — Z7189 Other specified counseling: Secondary | ICD-10-CM

## 2022-01-15 DIAGNOSIS — I5020 Unspecified systolic (congestive) heart failure: Secondary | ICD-10-CM

## 2022-01-15 DIAGNOSIS — K7290 Hepatic failure, unspecified without coma: Secondary | ICD-10-CM

## 2022-01-15 DIAGNOSIS — Z0000 Encounter for general adult medical examination without abnormal findings: Secondary | ICD-10-CM

## 2022-01-15 DIAGNOSIS — I5022 Chronic systolic (congestive) heart failure: Secondary | ICD-10-CM

## 2022-01-15 DIAGNOSIS — K219 Gastro-esophageal reflux disease without esophagitis: Secondary | ICD-10-CM | POA: Insufficient documentation

## 2022-01-15 DIAGNOSIS — E875 Hyperkalemia: Secondary | ICD-10-CM

## 2022-01-15 LAB — COMPREHENSIVE METABOLIC PANEL
ALT: 27 U/L (ref 0–44)
AST: 59 U/L — ABNORMAL HIGH (ref 15–41)
Albumin: 2.7 g/dL — ABNORMAL LOW (ref 3.5–5.0)
Alkaline Phosphatase: 344 U/L — ABNORMAL HIGH (ref 38–126)
Anion gap: 6 (ref 5–15)
BUN: 19 mg/dL (ref 8–23)
CO2: 23 mmol/L (ref 22–32)
Calcium: 8.4 mg/dL — ABNORMAL LOW (ref 8.9–10.3)
Chloride: 110 mmol/L (ref 98–111)
Creatinine, Ser: 0.67 mg/dL (ref 0.44–1.00)
GFR, Estimated: >60 mL/min (ref >60)
Glucose, Bld: 105 mg/dL — ABNORMAL HIGH (ref 70–99)
Potassium: 4.5 mmol/L (ref 3.5–5.1)
Sodium: 139 mmol/L (ref 135–145)
Total Bilirubin: 2.3 mg/dL — ABNORMAL HIGH (ref 0.3–1.2)
Total Protein: 6.3 g/dL — ABNORMAL LOW (ref 6.5–8.1)

## 2022-01-15 LAB — MITOCHONDRIAL ANTIBODIES: Mitochondrial M2 Ab, IgG: 123.9 Units — ABNORMAL HIGH (ref 0.0–20.0)

## 2022-01-15 LAB — HEMOGLOBIN AND HEMATOCRIT, BLOOD
HCT: 29.2 % — ABNORMAL LOW (ref 36.0–46.0)
Hemoglobin: 8.9 g/dL — ABNORMAL LOW (ref 12.0–15.0)

## 2022-01-15 LAB — GLUCOSE, CAPILLARY
Glucose-Capillary: 108 mg/dL — ABNORMAL HIGH (ref 70–99)
Glucose-Capillary: 100 mg/dL — ABNORMAL HIGH (ref 70–99)
Glucose-Capillary: 182 mg/dL — ABNORMAL HIGH (ref 70–99)
Glucose-Capillary: 146 mg/dL — ABNORMAL HIGH (ref 70–99)
Glucose-Capillary: 100 mg/dL — ABNORMAL HIGH (ref 70–99)

## 2022-01-15 LAB — AFP TUMOR MARKER: AFP, Serum, Tumor Marker: 3.1 ng/mL (ref 0.0–9.2)

## 2022-01-15 LAB — ANTI-SMOOTH MUSCLE ANTIBODY, IGG: F-Actin IgG: 10 Units (ref 0–19)

## 2022-01-15 MED ORDER — SODIUM CHLORIDE 0.9 % IV SOLN: INTRAVENOUS | Status: DC | PRN

## 2022-01-15 MED ORDER — FUROSEMIDE 20 MG PO TABS
20.0000 mg | ORAL_TABLET | Freq: Every day | ORAL | Status: DC
Start: 2022-01-15 — End: 2022-01-17
  Administered 2022-01-15 – 2022-01-17 (×3): 20 mg via ORAL
  Filled 2022-01-15 (×3): qty 1

## 2022-01-15 MED ORDER — HYDROXYZINE HCL 10 MG PO TABS
10.0000 mg | ORAL_TABLET | Freq: Once | ORAL | Status: AC
  Administered 2022-01-15: 10 mg via ORAL
  Filled 2022-01-15: qty 1

## 2022-01-15 MED ORDER — SPIRONOLACTONE 25 MG PO TABS
50.0000 mg | ORAL_TABLET | Freq: Every day | ORAL | Status: DC
Start: 2022-01-15 — End: 2022-01-17
  Administered 2022-01-15 – 2022-01-17 (×3): 50 mg via ORAL
  Filled 2022-01-15 (×3): qty 2

## 2022-01-15 NOTE — Plan of Care (Signed)

## 2022-01-15 NOTE — Progress Notes (Signed)
PROGRESS NOTE Hannah Blake  [id number] DOB: [birth year] DOA: 01/12/2022 PCP: Vevelyn Francois, NP   Brief Narrative/Hospital Course: 71 year old Non English speaking, speaks Vietnamese-but able to communicate reasonably recently diagnosed with liver cirrhosis and CHF with ejection fraction 45 to 50%, not taking medications for more than 6 months presented to ER with hematemesis, 3 WK of increased abdominal swelling.   She was severely anemic 6.6 g with significant abdominal distention -CT scan with cirrhosis and large volume ascites. S/P 1 unit PRBC transfusion, Protonix and octreotide and admitted to the hospital for GI work-up, and further management AKI/decompensated liver cirrhosis/symptomatic anemia.At least since 2008, patient retired and had been drinking 2 beers every day until November 2022. EGD done-found to have: Multiple clean-based nonbleeding gastric ulcers, large more than 5 mm esophageal varices completely eradicated, banded x 2, gastric biopsies performed.    Subjective: Seen and examined this morning grand-son at the bedside Complaints of more abdomen distention, no worsening of pain however.  Assessment and Plan: Principal Problem:   Decompensated hepatic cirrhosis (HCC) Active Problems:   AKI (acute kidney injury) ([place])   Protein-calorie malnutrition, severe   Cirrhosis of liver with ascites (HCC)   Acute GI bleeding   ABLA (acute blood loss anemia)   Esophageal varices in alcoholic cirrhosis (HCC)   Multiple gastric ulcers   Hyperkalemia   Goals of care, counseling/discussion   Chronic systolic CHF (congestive heart failure) ([place])   Decompensated hepatic cirrhosis Cirrhosis of liver with ascites s/p 8L paracentesis 6/26: Ongoing counseling for alcohol cessation.  Abdomen distention worsening, await GI recommendation if patient needs further paracentesis,overall prognosis guarded to poor PMT consulted.  Will need close follow-up alcoholic abstinence  Acute GI  bleeding-hematemesis ABLA Esophageal varices in alcoholic cirrhosis  Multiple gastric ulcers: Underwent EGD with 2 large esophageal varices that were banded and multiple nonbleeding gastric ulcers-gastric ulcer and varices likely the cause of patient's hematemesis and acute blood loss anemia.  Being managed with Protonix and octreotide drip, FLD for now and advance as tolerated as per GI.  Monitor H&H.  Severe malnutrition in the context of liver cirrhosis coronary disease Nutrition Problem: Severe Malnutrition Etiology: chronic illness (cirrhosis) Signs/Symptoms: severe fat depletion, severe muscle depletion Interventions: Ensure Enlive (each supplement provides 350kcal and 20 grams of protein), MVI, Prostat  AKI-resolved Hyperkalemia-resolved Chronic systolic CHF-not in failure Goals of care, counseling/discussion:Overall poor prognosis palliative care consulted remains full code  DVT prophylaxis: SCDs Start: 01/13/22 0549 Code Status:   Code Status: Full Code Family Communication: plan of care discussed with patient/grandson at bedside. Patient status is: inpatient because of ongoing management of ascites liver cirrhosis, bleeding. Level of care: Stepdown   Dispo: The patient is from: home            Anticipated disposition: TBD pending GI eval and PMD , Currently remains on drip   Mobility Assessment (last 72 hours)     Mobility Assessment     Row Name 01/14/22 1000           Does patient have an order for bedrest or is patient medically unstable No - Continue assessment       What is the highest level of mobility based on the progressive mobility assessment? Level 4 (Walks with assist in room) - Balance while marching in place and cannot step forward and back - Complete       Is the above level different from baseline mobility prior to current illness? No - Consider discontinuing PT/OT  Objective: Vitals last 24 hrs: Vitals:   01/15/22 0700  01/15/22 0800 01/15/22 0806 01/15/22 0900  BP: 124/68 130/67  130/65  Pulse: 97 96  92  Resp: 13 15  13   Temp:   97.9 F (36.6 C)   TempSrc:   Oral   SpO2: 94% 94%  94%  Weight:      Height:       Weight change: 0 kg  Physical Examination: General exam: alert awake,older than stated age, weak appearing. HEENT:Oral mucosa moist, Ear/Nose WNL grossly, dentition normal. Respiratory system: bilaterally diminished BS, no use of accessory muscle Cardiovascular system: S1 & S2 +, No JVD. Gastrointestinal system: Abdomen soft, nontender, distended, BS present  Nervous System:Alert, awake, moving extremities and grossly nonfocal Extremities: LE edema neg,distal peripheral pulses palpable.  Skin: No rashes,no icterus. MSK: Normal muscle bulk,tone, power  Medications reviewed:  Scheduled Meds:  (feeding supplement) PROSource Plus  30 mL Oral BID BM   Chlorhexidine Gluconate Cloth  6 each Topical Daily   feeding supplement  237 mL Oral BID BM   LORazepam  0.5 mg Oral QHS   multivitamin with minerals  1 tablet Oral Daily   [START ON 01/16/2022] pantoprazole  40 mg Intravenous Q12H   Continuous Infusions:  cefTRIAXone (ROCEPHIN)  IV Stopped (01/14/22 0950)   metronidazole Stopped (01/15/22 0048)   octreotide (SANDOSTATIN) 500 mcg in sodium chloride 0.9 % 250 mL (2 mcg/mL) infusion 50 mcg/hr (01/15/22 0630)   pantoprazole 8 mg/hr (01/15/22 0630)      Diet Order             Diet full liquid Room service appropriate? Yes; Fluid consistency: Thin  Diet effective now                   Intake/Output Summary (Last 24 hours) at 01/15/2022 1046 Last data filed at 01/15/2022 0630 Gross per 24 hour  Intake 844.2 ml  Output --  Net 844.2 ml   Net IO Since Admission: 4,172.25 mL [01/15/22 1046]  Wt Readings from Last 3 Encounters:  01/14/22 49.5 kg  05/27/21 47.2 kg  05/23/21 59.1 kg     Unresulted Labs (From admission, onward)     Start     Ordered   01/16/22 0500  CBC  Daily,    R     Question:  Specimen collection method  Answer:  Lab=Lab collect   01/15/22 0732   01/14/22 0500  Comprehensive metabolic panel  Daily,   R     Question:  Specimen collection method  Answer:  Lab=Lab collect   01/13/22 1129   01/14/22 0500  Zinc  Tomorrow morning,   R       Question:  Specimen collection method  Answer:  Lab=Lab collect   01/13/22 1200   01/14/22 0500  Vitamin A  Tomorrow morning,   R       Question:  Specimen collection method  Answer:  Lab=Lab collect   01/13/22 1200   01/13/22 1144  Mitochondrial antibodies  Once,   R       Question:  Specimen collection method  Answer:  Lab=Lab collect   01/13/22 1143   01/13/22 1144  Anti-smooth muscle antibody, IgG  Once,   R       Question:  Specimen collection method  Answer:  Lab=Lab collect   01/13/22 1143   01/12/22 2022  Urinalysis, Routine w reflex microscopic Urine, Clean Catch  Once,   URGENT  01/12/22 2021          Data Reviewed: I have personally reviewed following labs and imaging studies CBC: Recent Labs  Lab 01/12/22 1955 01/13/22 0709 01/13/22 2219 01/14/22 0637 01/14/22 1628 01/15/22 0637  WBC 14.3* 9.6  --  8.3  --   --   NEUTROABS 11.5*  --   --   --   --   --   HGB 6.6* 6.1* 8.3* 8.7* 9.1* 8.9*  HCT 22.3* 20.2* 25.9* 27.8* 29.5* 29.2*  MCV 86.4 84.9  --  83.2  --   --   PLT 323 167  --  156  --   --    Basic Metabolic Panel: Recent Labs  Lab 01/12/22 1955 01/13/22 0709 01/14/22 0637 01/15/22 0637  NA 135 136 139 139  K 5.7* 5.5* 4.6 4.5  CL 104 106 108 110  CO2 18* 23 23 23   GLUCOSE 161* 129* 106* 105*  BUN 39* 49* 35* 19  CREATININE 1.30* 1.00 0.70 0.67  CALCIUM 8.5* 8.6* 8.5* 8.4*   GFR: Estimated Creatinine Clearance: 44 mL/min (by C-G formula based on SCr of 0.67 mg/dL). Liver Function Tests: Recent Labs  Lab 01/12/22 1955 01/13/22 0709 01/14/22 0637 01/15/22 0637  AST 71* 57* 51* 59*  ALT 34 31 27 27   ALKPHOS 433* 336* 275* 344*  BILITOT 2.5* 3.0* 3.0*  2.3*  PROT 6.9 6.6 6.3* 6.3*  ALBUMIN 2.1* 2.9* 3.0* 2.7*   No results for input(s): "LIPASE", "AMYLASE" in the last 168 hours. Recent Labs  Lab 01/12/22 2051  AMMONIA 47*   Coagulation Profile: Recent Labs  Lab 01/12/22 2050  INR 1.2   BNP (last 3 results) No results for input(s): "PROBNP" in the last 8760 hours. HbA1C: No results for input(s): "HGBA1C" in the last 72 hours. CBG: Recent Labs  Lab 01/14/22 1212 01/14/22 1819 01/14/22 2341 01/15/22 0537 01/15/22 0800  GLUCAP 111* 99 172* 108* 100*   Lipid Profile: No results for input(s): "CHOL", "HDL", "LDLCALC", "TRIG", "CHOLHDL", "LDLDIRECT" in the last 72 hours. Thyroid Function Tests: No results for input(s): "TSH", "T4TOTAL", "FREET4", "T3FREE", "THYROIDAB" in the last 72 hours. Sepsis Labs: Recent Labs  Lab 01/12/22 0025 01/12/22 1955 01/12/22 2005  LATICACIDVEN 2.6* 6.2* 5.7*    Recent Results (from the past 240 hour(s))  Blood culture (routine x 2)     Status: None (Preliminary result)   Collection Time: 01/12/22 10:10 PM   Specimen: BLOOD  Result Value Ref Range Status   Specimen Description   Final    BLOOD BLOOD RIGHT HAND Performed at [facility] [address] [place], [place] [address]    Special Requests   Final    BOTTLES DRAWN AEROBIC AND ANAEROBIC Blood Culture results may not be optimal due to an inadequate volume of blood received in culture bottles Performed at [facility] [address], [place], [place] [address]    Culture   Final    NO GROWTH 2 DAYS Performed at [facility] [address], [place], [place] [address]    Report Status PENDING  Incomplete  Body fluid culture w Gram Stain     Status: None (Preliminary result)   Collection Time: 01/12/22 10:10 PM   Specimen: Peritoneal Cavity; Peritoneal Fluid  Result Value Ref Range Status   Specimen Description   Final    PERITONEAL CAVITY Performed at [facility] [address], [place], [place] [address]  Special Requests   Final    NONE Performed at [facility], [address], [place], [place] [address]    Gram Stain   Final    WBC PRESENT, PREDOMINANTLY MONONUCLEAR NO ORGANISMS SEEN CYTOSPIN SMEAR    Culture   Final    NO GROWTH 2 DAYS Performed at [facility], [address] [place], [place] [address]    Report Status PENDING  Incomplete  Blood culture (routine x 2)     Status: None (Preliminary result)   Collection Time: 01/12/22 11:10 PM   Specimen: BLOOD  Result Value Ref Range Status   Specimen Description   Final    BLOOD BLOOD LEFT FOREARM Performed at [facility], [address] [place], [place] [address]    Special Requests   Final    BOTTLES DRAWN AEROBIC AND ANAEROBIC Blood Culture adequate volume Performed at [facility], [address] [place], [place] [address]    Culture   Final    NO GROWTH 2 DAYS Performed at [facility], [address], [place], [place] [address]    Report Status PENDING  Incomplete  MRSA Next Gen by PCR, Nasal     Status: None   Collection Time: 01/13/22  6:03 AM   Specimen: Nasal Mucosa; Nasal Swab  Result Value Ref Range Status   MRSA by PCR Next Gen NOT DETECTED NOT DETECTED Final    Comment: (NOTE) The GeneXpert MRSA Assay (FDA approved for NASAL specimens only), is one component of a comprehensive MRSA colonization surveillance program. It is not intended to diagnose MRSA infection nor to guide or monitor treatment for MRSA infections. Test performance is not FDA approved in patients less than 57 years old. Performed at [facility], [address] [place], [place] [address]   Gram stain     Status: None   Collection Time: 01/13/22  4:30 PM   Specimen: PATH Cytology Peritoneal fluid  Result Value Ref Range Status   Specimen Description   Final    PERITONEAL Performed at  [facility], [address], [place], [place] [address]    Special Requests NO CULTURE ORDERED, CLT  Final   Gram Stain   Final    WBC PRESENT,BOTH PMN AND MONONUCLEAR NO ORGANISMS SEEN CYTOSPIN SMEAR Performed at [facility], [address] [place], [place] [address]    Report Status 01/13/2022 FINAL  Final    Antimicrobials: Anti-infectives (From admission, onward)    Start     Dose/Rate Route Frequency Ordered Stop   01/13/22 1000  cefTRIAXone (ROCEPHIN) 2 g in sodium chloride 0.9 % 100 mL IVPB        2 g 200 mL/hr over 30 Minutes Intravenous Every 24 hours 01/13/22 0550     01/12/22 2300  metroNIDAZOLE (FLAGYL) IVPB 500 mg        500 mg 100 mL/hr over 60 Minutes Intravenous Every 12 hours 01/12/22 2255     01/12/22 2045  cefTRIAXone (ROCEPHIN) 1 g in sodium chloride 0.9 % 100 mL IVPB        1 g 200 mL/hr over 30 Minutes Intravenous  Once 01/12/22 2042 01/12/22 2145      Culture/Microbiology    Component Value Date/Time   SDES  01/13/2022 1630    PERITONEAL Performed at [facility], [address] [place], [place] [address]    SPECREQUEST NO CULTURE ORDERED, CLT 01/13/2022  1630   CULT  01/12/2022 2310    NO GROWTH 2 DAYS Performed at [facility], [address] [place], [place] [address]    REPTSTATUS 01/13/2022 FINAL 01/13/2022 1630    Other culture-see note  Radiology Studies: US Paracentesis  Result Date: 01/13/2022 INDICATION: Cirrhosis; ascites EXAM: ULTRASOUND GUIDED right lower quadrant PARACENTESIS MEDICATIONS: 8 cc of 1% lidocaine COMPLICATIONS: None immediate. PROCEDURE: Informed written consent was obtained from the patient after a discussion of the risks, benefits and alternatives to treatment. A timeout was performed prior to the initiation of the procedure. Initial ultrasound scanning demonstrates a large amount of ascites within the right lower abdominal quadrant. The right lower abdomen was  prepped and draped in the usual sterile fashion. 1% lidocaine was used for local anesthesia. Following this, a 19 gauge, 7-cm, Yueh catheter was introduced. An ultrasound image was saved for documentation purposes. The paracentesis was performed. The catheter was removed and a dressing was applied. The patient tolerated the procedure well without immediate post procedural complication. FINDINGS: A total of approximately 8 L of thin yellow fluid was removed. Samples were sent laboratory as requested by the clinical team IMPRESSION: Successful ultrasound-guided paracentesis yielding 8 liters of peritoneal fluid. PLAN: If the patient eventually requires >/=2 paracenteses in a 30 day period, candidacy for formal evaluation by the [facility] will be assessed. Electronically Signed   By: Acquanetta Belling M.D.   On: 01/13/2022 16:43     LOS: 2 days   Lanae Boast, MD [facility]  01/15/2022, 10:46 AM

## 2022-01-15 NOTE — Consult Note (Signed)
Consultation Note Date: 01/15/2022   Patient Name: Hannah Blake  DOB: 1950/10/23  MRN: [id number]  Age / Sex: 71 y.o., female  PCP: Barbette Merino, NP Referring Physician: Lanae Boast, MD  Reason for Consultation: Establishing goals of care  HPI/Patient Profile: 71 y.o. female     admitted on 01/12/2022    Clinical Assessment and Goals of Care: 71 year old lady who lives at home with her daughter in [place], [place], has 2 grandchildren who are also involved with her care.  Patient admitted to hospital medicine service with GI colleagues following for GI bleed with hematemesis, known history of cirrhosis.  Hemoglobin was 6.1 at the time of admission.  Patient underwent EGD on 6/27 which showed large esophageal varices as well as multiple gastric ulcers, she underwent variceal band ligation.  She remains on Protonix octreotide and antibiotics.  Also on Lasix and spironolactone.  AMA and ASMA are being checked.  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  Has had ascites requiring paracentesis in this hospitalization.  Palliative medicine team has been consulted for CODE STATUS and broad goals of care discussions.  Patient is awake alert resting in bed.  She is able to converse some in [place].  She denies any pain.  She denies any chest discomfort or abdominal discomfort.  Call placed and discussed with grandson on the phone as well, see below.  NEXT OF KIN Lives at home with her daughter in [place], [place].  Has 2 grandsons who are also involved in her care.  SUMMARY OF RECOMMENDATIONS   Code/full scope care for now. Continue current medical management, monitor hospital course and overall disease trajectory of illness.  Patient does not have advanced directives, no living will prepared, no healthcare power of  attorney paperwork prepared.  Lives at home with her daughter.  Has 2 grandsons who are also involved with her care.  Discussed with grandson on the phone today about the serious nature of the patient's illness and the high likelihood of ongoing decline and decompensation given her current condition.  He will discuss this with his aunt, the patient's daughter with whom she lives.  Thank you for the consult.  Code Status/Advance Care Planning: Full code   Symptom Management:     Palliative Prophylaxis:  Delirium Protocol  Additional Recommendations (Limitations, Scope, Preferences): Full Scope Treatment  Psycho-social/Spiritual:  Desire for further Chaplaincy support:yes Additional Recommendations: Caregiving  Support/Resources  Prognosis:  Unable to determine  Discharge Planning: To Be Determined      Primary Diagnoses: Present on Admission:  Decompensated hepatic cirrhosis (HCC)  Acute GI bleeding  Cirrhosis of liver with ascites (HCC)  ABLA (acute blood loss anemia)  AKI (acute kidney injury) (HCC)  Protein-calorie malnutrition, severe   I have reviewed the medical record, interviewed the patient and family, and examined the patient. The following aspects are pertinent.  Past Medical History:  Diagnosis Date   Bell's palsy  Cirrhosis (HCC)    Coronary artery disease    Social History   Socioeconomic History   Marital status: Single    Spouse name: Not on file   Number of children: Not on file   Years of education: Not on file   Highest education level: Not on file  Occupational History   Not on file  Tobacco Use   Smoking status: Every Day    Types: Cigarettes   Smokeless tobacco: Never  Vaping Use   Vaping Use: Never used  Substance and Sexual Activity   Alcohol use: Not Currently    Comment: weekly   Drug use: Never   Sexual activity: Not on file  Other Topics Concern   Not on file  Social History Narrative   Not on file   Social  Determinants of Health   Financial Resource Strain: Not on file  Food Insecurity: Not on file  Transportation Needs: Not on file  Physical Activity: Not on file  Stress: Not on file  Social Connections: Not on file   Family History  Family history unknown: Yes   Scheduled Meds:  (feeding supplement) PROSource Plus  30 mL Oral BID BM   Chlorhexidine Gluconate Cloth  6 each Topical Daily   feeding supplement  237 mL Oral BID BM   furosemide  20 mg Oral Daily   LORazepam  0.5 mg Oral QHS   multivitamin with minerals  1 tablet Oral Daily   [START ON 01/16/2022] pantoprazole  40 mg Intravenous Q12H   spironolactone  50 mg Oral Daily   Continuous Infusions:  cefTRIAXone (ROCEPHIN)  IV Stopped (01/15/22 1200)   metronidazole Stopped (01/15/22 1342)   octreotide (SANDOSTATIN) 500 mcg in sodium chloride 0.9 % 250 mL (2 mcg/mL) infusion 50 mcg/hr (01/15/22 1239)   pantoprazole 8 mg/hr (01/15/22 1238)   PRN Meds:.diphenhydrAMINE, mouth rinse Medications Prior to Admission:  Prior to Admission medications   Medication Sig Start Date End Date Taking? Authorizing Provider  diphenhydramine-acetaminophen (TYLENOL PM) 25-500 MG TABS tablet Take 1 tablet by mouth at bedtime as needed (for sleep).   Yes [provider]   No Known Allergies Review of Systems Denies abdominal pain currently Physical Exam Awake alert oriented Resting in bed Some abdominal fullness and distention noted Regular work of breathing Monitor noted  Vital Signs: BP (!) 107/53   Pulse 98   Temp 98.9 F (37.2 C) (Oral)   Resp 16   Ht 4\' 11"  (1.499 m)   Wt 49.5 kg   SpO2 95%   BMI 22.04 kg/m  Pain Scale: 0-10 POSS *See Group Information*: 1-Acceptable,Awake and alert Pain Score: 0-No pain   SpO2: SpO2: 95 % O2 Device:SpO2: 95 % O2 Flow Rate: .O2 Flow Rate (L/min): 2 L/min  IO: Intake/output summary:  Intake/Output Summary (Last 24 hours) at 01/15/2022 1529 Last data filed at 01/15/2022  0800 Gross per 24 hour  Intake 391.65 ml  Output --  Net 391.65 ml    LBM: Last BM Date : 01/15/22 Baseline Weight: Weight: 54.7 kg Most recent weight: Weight: 49.5 kg     Palliative Assessment/Data:   PPS 40%  Time In:  1430 Time Out:  1530 Time Total:  60  Greater than 50%  of this time was spent counseling and coordinating care related to the above assessment and plan.  Signed by: 01/17/22, MD   Please contact Palliative Medicine Team phone at [phone] for questions and concerns.  For individual provider: See [phone]

## 2022-01-15 NOTE — Hospital Course (Addendum)
71 year old Non English speaking, speaks Vietnamese-but able to communicate reasonably recently diagnosed with liver cirrhosis and CHF with ejection fraction 45 to 50%, not taking medications for more than 6 months presented to ER with hematemesis, 3 WK of increased abdominal swelling.   She was severely anemic 6.6 g with significant abdominal distention -CT scan with cirrhosis and large volume ascites. S/P 1 unit PRBC transfusion, Protonix and octreotide and admitted to the hospital for GI work-up, and further management AKI/decompensated liver cirrhosis/symptomatic anemia.At least since 2008, patient retired and had been drinking 2 beers every day until November 2022. EGD done-found to have: Multiple clean-based nonbleeding gastric ulcers, large more than 5 mm esophageal varices completely eradicated, banded x 2, gastric biopsies performed. 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 AMA 123. Tolerating po. Gi stopped drip and advised" -ursodiol 300 mg twice daily,Lasix 20 mg and spironolactone 50 mg,Avoid beta-blocker for now because of borderline low blood pressure-Protonix 40 mg twice a day until repeat EGD is done. Follow-up in GI clinic in 4 to 6 weeks.Recommend repeat EGD in 2 months for variceal band ligation as well as to document healing of gastric ulcers.  Patient was monitored overnight, remains hemodynamically stable although soft blood pressure.  She will be discharged home with outpatient follow-up with GI, consider outpatient palliative care.

## 2022-01-15 NOTE — Progress Notes (Signed)
[facility] Gastroenterology Progress Note  Hannah Blake 71 y.o. 08-Jun-1951  CC:  GI bleeding, cirrhosis of the liver   Subjective: Patient seen and examined at bedside, her grandson is in the room and assists with translation.  Patient states abdominal distention and discomfort has improved, also able to breathe easier since paracentesis.  Patient had 8 L of fluid removed.  Noticing she is starting to develop some recurrence of distention today but not as bad as it was.  Patient continues to have dark stools but notably with decrease in amount, stools appear to be black and tarry, less red blood.  Denies fever, chills, nausea, vomiting.  Patient's main complaint is that she has itching over her whole body.  Medication yesterday was helpful.  ROS : Review of Systems  Constitutional:  Negative for chills and fever.  Gastrointestinal:  Positive for blood in stool, diarrhea and melena. Negative for abdominal pain, constipation, heartburn, nausea and vomiting.  Genitourinary:  Negative for dysuria and urgency.  Musculoskeletal:  Negative for falls.      Objective: Vital signs in last 24 hours: Vitals:   01/15/22 1100 01/15/22 1155  BP: 136/68   Pulse: 88   Resp: 15   Temp:  98.9 F (37.2 C)  SpO2: 92%     Physical Exam:  General:  Alert, cooperative, no distress, appears stated age  Head:  Normocephalic, without obvious abnormality, atraumatic  Eyes:  Anicteric sclera, EOM's intact  Lungs:   Clear to auscultation bilaterally, respirations unlabored  Heart:  Regular rate and rhythm, S1, S2 normal  Abdomen:   Soft, mild distention, non-tender, bowel sounds active   Extremities: Extremities normal, atraumatic, no  edema  Pulses: 2+ and symmetric    Lab Results: Recent Labs    01/14/22 0637 01/15/22 0637  NA 139 139  K 4.6 4.5  CL 108 110  CO2 23 23  GLUCOSE 106* 105*  BUN 35* 19  CREATININE 0.70 0.67  CALCIUM 8.5* 8.4*   Recent Labs    01/14/22 0637 01/15/22 0637  AST 51*  59*  ALT 27 27  ALKPHOS 275* 344*  BILITOT 3.0* 2.3*  PROT 6.3* 6.3*  ALBUMIN 3.0* 2.7*   Recent Labs    01/12/22 1955 01/13/22 0709 01/13/22 2219 01/14/22 0637 01/14/22 1628 01/15/22 0637  WBC 14.3* 9.6  --  8.3  --   --   NEUTROABS 11.5*  --   --   --   --   --   HGB 6.6* 6.1*   < > 8.7* 9.1* 8.9*  HCT 22.3* 20.2*   < > 27.8* 29.5* 29.2*  MCV 86.4 84.9  --  83.2  --   --   PLT 323 167  --  156  --   --    < > = values in this interval not displayed.   Recent Labs    01/12/22 2050  LABPROT 14.7  INR 1.2      Assessment Cirrhosis of liver with ascites - Patient on octreotide - On empiric antibiotic coverage for SBP - AST 59, ALT 27, alk phos 344, T. bili 2.3 - CT A/P 01/13/22 - Cirrhosis, associated splenomegaly and large volume ascites. - Peritoneal fluid shows albumin  <1.5, total protein 3.3, glucose 112, cultures pending - MELD-Na: 13   Acute GI bleeding, acute blood loss anemia - EGD 01/14/2022 -nonbleeding gastric ulcers with no stigmata of bleeding.  Biopsied.  Large esophageal varices greater than 5 mm.  Completely eradicated.  Banded. -  Patient has received 1 unit PRBCs - On Protonix - Tolerating full liquids - Hemoglobin 8.9 s/p blood transfusion - BUN 19, creatinine 0.67 - Iron 23, ferritin 16 - Normal folate and vitamin B12  Plan: Patient feeling better since paracentesis.  EGD showed large esophageal varices and nonbleeding gastric ulcers.  GI bleeding has decreased, hemoglobin stable.  She will need repeat EGD in 2 months to monitor healing. Patient again developing abdominal distention. May consider repeat paracentesis. AMA and ASMA pending. 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 Fluid cultures pending. Continue full liquid diet as tolerated. Continue octreotide and antibiotics. Continue daily CBC and transfuse as needed to maintain HGB > 7.  [name] will follow.  Angelique Holm PA-C 01/15/2022, 12:21 PM  Contact #  [phone]

## 2022-01-15 NOTE — Progress Notes (Signed)
  Transition of Care [facility]) Screening Note   Patient Details  Name: DANETTA PROM Date of Birth: 07-03-1951   Transition of Care [facility]) CM/SW Contact:    Amada Jupiter, LCSW Phone Number: 01/15/2022, 10:23 AM    Transition of Care Department [facility]) has reviewed patient and no TOC needs have been identified at this time. We will continue to monitor patient advancement through interdisciplinary progression rounds. If new patient transition needs arise, please place a TOC consult.

## 2022-01-16 DIAGNOSIS — I8510 Secondary esophageal varices without bleeding: Secondary | ICD-10-CM

## 2022-01-16 DIAGNOSIS — K7030 Alcoholic cirrhosis of liver without ascites: Secondary | ICD-10-CM

## 2022-01-16 LAB — GLUCOSE, CAPILLARY
Glucose-Capillary: 100 mg/dL — ABNORMAL HIGH (ref 70–99)
Glucose-Capillary: 101 mg/dL — ABNORMAL HIGH (ref 70–99)
Glucose-Capillary: 106 mg/dL — ABNORMAL HIGH (ref 70–99)
Glucose-Capillary: 151 mg/dL — ABNORMAL HIGH (ref 70–99)

## 2022-01-16 LAB — COMPREHENSIVE METABOLIC PANEL
ALT: 27 U/L (ref 0–44)
AST: 51 U/L — ABNORMAL HIGH (ref 15–41)
Albumin: 2.6 g/dL — ABNORMAL LOW (ref 3.5–5.0)
Alkaline Phosphatase: 345 U/L — ABNORMAL HIGH (ref 38–126)
Anion gap: 6 (ref 5–15)
BUN: 14 mg/dL (ref 8–23)
CO2: 26 mmol/L (ref 22–32)
Calcium: 8.5 mg/dL — ABNORMAL LOW (ref 8.9–10.3)
Chloride: 105 mmol/L (ref 98–111)
Creatinine, Ser: 0.74 mg/dL (ref 0.44–1.00)
GFR, Estimated: >60 mL/min (ref >60)
Glucose, Bld: 132 mg/dL — ABNORMAL HIGH (ref 70–99)
Potassium: 4.5 mmol/L (ref 3.5–5.1)
Sodium: 137 mmol/L (ref 135–145)
Total Bilirubin: 1.7 mg/dL — ABNORMAL HIGH (ref 0.3–1.2)
Total Protein: 6.4 g/dL — ABNORMAL LOW (ref 6.5–8.1)

## 2022-01-16 LAB — CBC
HCT: 28.9 % — ABNORMAL LOW (ref 36.0–46.0)
Hemoglobin: 9 g/dL — ABNORMAL LOW (ref 12.0–15.0)
MCH: 26.6 pg (ref 26.0–34.0)
MCHC: 31.1 g/dL (ref 30.0–36.0)
MCV: 85.5 fL (ref 80.0–100.0)
Platelets: 180 10*3/uL (ref 150–400)
RBC: 3.38 MIL/uL — ABNORMAL LOW (ref 3.87–5.11)
RDW: 17.3 % — ABNORMAL HIGH (ref 11.5–15.5)
WBC: 10 10*3/uL (ref 4.0–10.5)
nRBC: 0 % (ref 0.0–0.2)

## 2022-01-16 LAB — ZINC: Zinc: 50 ug/dL (ref 44–115)

## 2022-01-16 LAB — BODY FLUID CULTURE W GRAM STAIN: Culture: NO GROWTH

## 2022-01-16 LAB — SURGICAL PATHOLOGY

## 2022-01-16 MED ORDER — URSODIOL 300 MG PO CAPS
300.0000 mg | ORAL_CAPSULE | Freq: Three times a day (TID) | ORAL | Status: DC
  Filled 2022-01-16: qty 1

## 2022-01-16 MED ORDER — URSODIOL 300 MG PO CAPS
300.0000 mg | ORAL_CAPSULE | Freq: Two times a day (BID) | ORAL | Status: DC
  Administered 2022-01-16 – 2022-01-17 (×2): 300 mg via ORAL
  Filled 2022-01-16 (×4): qty 1

## 2022-01-16 NOTE — Progress Notes (Signed)
[facility] Gastroenterology Progress Note  Hannah Blake 71 y.o. [birth year]  CC:  GI bleeding, cirrhosis of the liver   Subjective: Patient seen and examined at bedside, her grandson is in the room and assists with translation.  Patient is feeling much better today.  Tolerating full liquid diet and denies nausea, vomiting, fever, chills, abdominal pain.  Reports that rectal bleeding continues to subside, still having small amount of black tarry stool and bowel movements.  ROS : Review of Systems  Constitutional:  Negative for chills and fever.  Gastrointestinal:  Positive for diarrhea and melena. Negative for abdominal pain, blood in stool, constipation, heartburn, nausea and vomiting.  Genitourinary:  Negative for dysuria and urgency.  Musculoskeletal:  Negative for falls.      Objective: Vital signs in last 24 hours: Vitals:   01/16/22 0600 01/16/22 0800  BP: (!) 93/37   Pulse: 81   Resp: (!) 22   Temp:  98.6 F (37 C)  SpO2: 97%     Physical Exam:  General:  Alert, cooperative, no distress  Head:  Normocephalic, without obvious abnormality, atraumatic  Eyes:  Anicteric sclera, EOM's intact  Lungs:   Clear to auscultation bilaterally, respirations unlabored  Heart:  Regular rate and rhythm, S1, S2 normal  Abdomen:   Soft, non-tender, mildly distended, bowel sounds active all four quadrants,  no masses  Extremities: Extremities normal, atraumatic, no  edema  Pulses: 2+ and symmetric    Lab Results: Recent Labs    01/15/22 0637 01/16/22 0309  NA 139 137  K 4.5 4.5  CL 110 105  CO2 23 26  GLUCOSE 105* 132*  BUN 19 14  CREATININE 0.67 0.74  CALCIUM 8.4* 8.5*   Recent Labs    01/15/22 0637 01/16/22 0309  AST 59* 51*  ALT 27 27  ALKPHOS 344* 345*  BILITOT 2.3* 1.7*  PROT 6.3* 6.4*  ALBUMIN 2.7* 2.6*   Recent Labs    01/14/22 0637 01/14/22 1628 01/15/22 0637 01/16/22 0309  WBC 8.3  --   --  10.0  HGB 8.7*   < > 8.9* 9.0*  HCT 27.8*   < > 29.2* 28.9*  MCV  83.2  --   --  85.5  PLT 156  --   --  180   < > = values in this interval not displayed.   No results for input(s): "LABPROT", "INR" in the last 72 hours.    Assessment Cirrhosis of liver with ascites - AST 51, ALT 27, alk phos 345, T. bili 1.7 (improving) - CT A/P 01/13/22 - Cirrhosis, associated splenomegaly and large volume ascites. - S/p 8 L paracentesis on admission.  Fluid analysis negative for SBP. - MELD-Na: 13 - Serum AFP normal, 3.1 - AMA elevated at 123.9 - ASMA normal, 10 - X-ray abdomen 6/28/203 - Findings suggest residual ascites, reduced volume suspected since previous imaging.  No signs of bowel obstruction.  Possible trace LEFT-sided pleural effusion.  No signs of free air.   Acute GI bleeding, acute blood loss anemia - EGD 01/14/2022 - Nonbleeding gastric ulcers with no stigmata of bleeding.  Biopsied.  Large esophageal varices greater than 5 mm.  Completely eradicated.  Banded. 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  Repeat ANA elevated at 123.9.  Normal ASMA. Continue Protonix, octreotide, antibiotics. Lasix  20 mg, spironolactone 50 mg. [name] will follow.  Angelique Holm PA-C 01/16/2022, 10:50 AM  Contact #  [phone]

## 2022-01-16 NOTE — Progress Notes (Signed)
PROGRESS NOTE Hannah Blake  KGM:[id number] DOB: 1951-05-21 DOA: 01/12/2022 PCP: Barbette Merino, NP   Brief Narrative/Hospital Course: 71 year old Non English speaking, speaks Vietnamese-but able to communicate reasonably recently diagnosed with liver cirrhosis and CHF with ejection fraction 45 to 50%, not taking medications for more than 6 months presented to ER with hematemesis, 3 WK of increased abdominal swelling.   She was severely anemic 6.6 g with significant abdominal distention -CT scan with cirrhosis and large volume ascites. S/P 1 unit PRBC transfusion, Protonix and octreotide and admitted to the hospital for GI work-up, and further management AKI/decompensated liver cirrhosis/symptomatic anemia.At least since 2008, patient retired and had been drinking 2 beers every day until November 2022. EGD done-found to have: Multiple clean-based nonbleeding gastric ulcers, large more than 5 mm esophageal varices completely eradicated, banded x 2, gastric biopsies performed.  Subjective: Seen examined Overnight afebrile blood pressure 90s to low 100,  Stable renal function hemoglobin  Bp soft at times on recheck just now in 118 Has been up- no abd pain or worsening of abdomen distension Had small BM black tarry  Assessment and Plan: Principal Problem:   Decompensated hepatic cirrhosis (HCC) Active Problems:   AKI (acute kidney injury) (HCC)   Protein-calorie malnutrition, severe   Cirrhosis of liver with ascites (HCC)   Acute GI bleeding   ABLA (acute blood loss anemia)   Esophageal varices in alcoholic cirrhosis (HCC)   Multiple gastric ulcers   Hyperkalemia   Goals of care, counseling/discussion   Chronic systolic CHF (congestive heart failure) (HCC)   Decompensated hepatic cirrhosis Cirrhosis of liver with ascites s/p 8L paracentesis 6/26: Ongoing counseling for alcohol cessation.GI following closely may need repeat paracentesis -avoid further GI plan.  Continue Lasix,  Aldactone.Palliative care following remains full code.    Acute GI bleeding-hematemesis ABLA Esophageal varices in alcoholic cirrhosis  Multiple gastric ulcers: Underwent EGD with 2 large esophageal varices that were banded and multiple nonbleeding gastric ulcers-gastric ulcer and varices likely the cause of patient's hematemesis and acute blood loss anemia.  Being managed with Protonix and octreotide drip, empiric ceftriaxone, diet as tolerated appreciate GI follow-up.  H&H remained stable Recent Labs  Lab 01/13/22 2219 01/14/22 0637 01/14/22 1628 01/15/22 0637 01/16/22 0309  HGB 8.3* 8.7* 9.1* 8.9* 9.0*  HCT 25.9* 27.8* 29.5* 29.2* 28.9*    Severe malnutrition in the context of liver cirrhosis coronary disease Nutrition Problem: Severe Malnutrition Etiology: chronic illness (cirrhosis) Signs/Symptoms: severe fat depletion, severe muscle depletion Interventions: Ensure Enlive (each supplement provides 350kcal and 20 grams of protein), MVI, Prostat  AKI-resolved Hyperkalemia-resolved Chronic systolic CHF-not in failure.  Monitor fluid status on Lasix and Aldactone Goals of care, counseling/discussion:Overall poor prognosis palliative care following, remains full code   DVT prophylaxis: SCDs Start: 01/13/22 0549 Code Status:   Code Status: Full Code Family Communication: plan of care discussed with patient/grandson at bedside. Patient status is: inpatient because of ongoing management of ascites liver cirrhosis, bleeding. Level of care: Stepdown   Dispo: The patient is from: home            Anticipated disposition: TBD pending GI eval and PMT , Currently remains on drip   Mobility Assessment (last 72 hours)     Mobility Assessment     Row Name 01/14/22 1000           Does patient have an order for bedrest or is patient medically unstable No - Continue assessment       What is the  highest level of mobility based on the progressive mobility assessment? Level 4 (Walks with  assist in room) - Balance while marching in place and cannot step forward and back - Complete       Is the above level different from baseline mobility prior to current illness? No - Consider discontinuing PT/OT                 Objective: Vitals last 24 hrs: Vitals:   01/16/22 0400 01/16/22 0500 01/16/22 0600 01/16/22 0800  BP: (!) 104/57 (!) 92/39 (!) 93/37   Pulse: 86 71 81   Resp: 12 15 (!) 22   Temp:    98.6 F (37 C)  TempSrc:    Oral  SpO2: 94% 97% 97%   Weight:  46.2 kg    Height:       Weight change: -3.3 kg  Physical Examination: General exam: AA, older than stated age, weak appearing. HEENT:Oral mucosa moist, Ear/Nose WNL grossly, dentition normal. Respiratory system: bilaterally diminished, no use of accessory muscle Cardiovascular system: S1 & S2 +, No JVD,. Gastrointestinal system: Abdomen soft, mildy-moderately distended, BS+ Nervous System:Alert, awake, moving extremities and grossly nonfocal Extremities: LE ankle edema neg, distal peripheral pulses palpable.  Skin: No rashes,no icterus. MSK: Normal muscle bulk,tone, power   Medications reviewed:  Scheduled Meds:  (feeding supplement) PROSource Plus  30 mL Oral BID BM   Chlorhexidine Gluconate Cloth  6 each Topical Daily   feeding supplement  237 mL Oral BID BM   furosemide  20 mg Oral Daily   LORazepam  0.5 mg Oral QHS   multivitamin with minerals  1 tablet Oral Daily   pantoprazole  40 mg Intravenous Q12H   spironolactone  50 mg Oral Daily   Continuous Infusions:  sodium chloride 10 mL/hr at 01/16/22 0400   cefTRIAXone (ROCEPHIN)  IV Stopped (01/15/22 1140)   metronidazole Stopped (01/16/22 0021)   octreotide (SANDOSTATIN) 500 mcg in sodium chloride 0.9 % 250 mL (2 mcg/mL) infusion 50 mcg/hr (01/16/22 0400)      Diet Order             Diet full liquid Room service appropriate? Yes; Fluid consistency: Thin  Diet effective now                   Intake/Output Summary (Last 24 hours) at  01/16/2022 0900 Last data filed at 01/16/2022 0400 Gross per 24 hour  Intake 1933.57 ml  Output --  Net 1933.57 ml   Net IO Since Admission: 6,158.3 mL [01/16/22 0900]  Wt Readings from Last 3 Encounters:  01/16/22 46.2 kg  05/27/21 47.2 kg  05/23/21 59.1 kg     Unresulted Labs (From admission, onward)     Start     Ordered   01/16/22 0500  CBC  Daily,   R     Question:  Specimen collection method  Answer:  Lab=Lab collect   01/15/22 0732   01/14/22 0500  Comprehensive metabolic panel  Daily,   R     Question:  Specimen collection method  Answer:  Lab=Lab collect   01/13/22 1129   01/14/22 0500  Zinc  Tomorrow morning,   R       Question:  Specimen collection method  Answer:  Lab=Lab collect   01/13/22 1200   01/14/22 0500  Vitamin A  Tomorrow morning,   R       Question:  Specimen collection method  Answer:  Lab=Lab collect   01/13/22  1200   01/12/22 2022  Urinalysis, Routine w reflex microscopic Urine, Clean Catch  Once,   URGENT        01/12/22 2021          Data Reviewed: I have personally reviewed following labs and imaging studies CBC: Recent Labs  Lab 01/12/22 1955 01/13/22 0709 01/13/22 2219 01/14/22 0637 01/14/22 1628 01/15/22 0637 01/16/22 0309  WBC 14.3* 9.6  --  8.3  --   --  10.0  NEUTROABS 11.5*  --   --   --   --   --   --   HGB 6.6* 6.1* 8.3* 8.7* 9.1* 8.9* 9.0*  HCT 22.3* 20.2* 25.9* 27.8* 29.5* 29.2* 28.9*  MCV 86.4 84.9  --  83.2  --   --  85.5  PLT 323 167  --  156  --   --  180   Basic Metabolic Panel: Recent Labs  Lab 01/12/22 1955 01/13/22 0709 01/14/22 0637 01/15/22 0637 01/16/22 0309  NA 135 136 139 139 137  K 5.7* 5.5* 4.6 4.5 4.5  CL 104 106 108 110 105  CO2 18* 23 23 23 26   GLUCOSE 161* 129* 106* 105* 132*  BUN 39* 49* 35* 19 14  CREATININE 1.30* 1.00 0.70 0.67 0.74  CALCIUM 8.5* 8.6* 8.5* 8.4* 8.5*   GFR: Estimated Creatinine Clearance: 44 mL/min (by C-G formula based on SCr of 0.74 mg/dL). Liver Function  Tests: Recent Labs  Lab 01/12/22 1955 01/13/22 0709 01/14/22 0637 01/15/22 0637 01/16/22 0309  AST 71* 57* 51* 59* 51*  ALT 34 31 27 27 27   ALKPHOS 433* 336* 275* 344* 345*  BILITOT 2.5* 3.0* 3.0* 2.3* 1.7*  PROT 6.9 6.6 6.3* 6.3* 6.4*  ALBUMIN 2.1* 2.9* 3.0* 2.7* 2.6*   No results for input(s): "LIPASE", "AMYLASE" in the last 168 hours. Recent Labs  Lab 01/12/22 2051  AMMONIA 47*   Coagulation Profile: Recent Labs  Lab 01/12/22 2050  INR 1.2   BNP (last 3 results) No results for input(s): "PROBNP" in the last 8760 hours. HbA1C: No results for input(s): "HGBA1C" in the last 72 hours. CBG: Recent Labs  Lab 01/15/22 0800 01/15/22 1146 01/15/22 1817 01/15/22 2325 01/16/22 0504  GLUCAP 100* 182* 146* 100* 101*   Lipid Profile: No results for input(s): "CHOL", "HDL", "LDLCALC", "TRIG", "CHOLHDL", "LDLDIRECT" in the last 72 hours. Thyroid Function Tests: No results for input(s): "TSH", "T4TOTAL", "FREET4", "T3FREE", "THYROIDAB" in the last 72 hours. Sepsis Labs: Recent Labs  Lab 01/12/22 0025 01/12/22 1955 01/12/22 2005  LATICACIDVEN 2.6* 6.2* 5.7*    Recent Results (from the past 240 hour(s))  Blood culture (routine x 2)     Status: None (Preliminary result)   Collection Time: 01/12/22 10:10 PM   Specimen: BLOOD  Result Value Ref Range Status   Specimen Description   Final    BLOOD BLOOD RIGHT HAND Performed at [facility], [address], [place], [place]    Special Requests   Final    BOTTLES DRAWN AEROBIC AND ANAEROBIC Blood Culture results may not be optimal due to an inadequate volume of blood received in culture bottles Performed at [facility], [address] [place], [place]    Culture   Final    NO GROWTH 3 DAYS Performed at [facility], [address] [place], [address] [place]    Report Status PENDING  Incomplete  Body fluid culture w Gram Stain  Status: None  (Preliminary result)   Collection Time: 01/12/22 10:10 PM   Specimen: Peritoneal Cavity; Peritoneal Fluid  Result Value Ref Range Status   Specimen Description   Final    PERITONEAL CAVITY Performed at [facility], [address], [place], [place] [address]    Special Requests   Final    NONE Performed at [facility], [address] [place], [place] [address]    Gram Stain   Final    WBC PRESENT, PREDOMINANTLY MONONUCLEAR NO ORGANISMS SEEN CYTOSPIN SMEAR    Culture   Final    NO GROWTH 2 DAYS Performed at [facility], [address] [place], [place] [address]    Report Status PENDING  Incomplete  Blood culture (routine x 2)     Status: None (Preliminary result)   Collection Time: 01/12/22 11:10 PM   Specimen: BLOOD  Result Value Ref Range Status   Specimen Description   Final    BLOOD BLOOD LEFT FOREARM Performed at [facility], [address] [place], [place] [address]    Special Requests   Final    BOTTLES DRAWN AEROBIC AND ANAEROBIC Blood Culture adequate volume Performed at [facility], [address], [place], [place] [address]    Culture   Final    NO GROWTH 3 DAYS Performed at [facility], [address], [place], [place] [address]    Report Status PENDING  Incomplete  MRSA Next Gen by PCR, Nasal     Status: None   Collection Time: 01/13/22  6:03 AM   Specimen: Nasal Mucosa; Nasal Swab  Result Value Ref Range Status   MRSA by PCR Next Gen NOT DETECTED NOT DETECTED Final    Comment: (NOTE) The GeneXpert MRSA Assay (FDA approved for NASAL specimens only), is one component of a comprehensive MRSA colonization surveillance program. It is not intended to diagnose MRSA infection nor to guide or monitor treatment for MRSA infections. Test performance is not FDA approved in patients less than 48 years old. Performed at [facility], [address], [place], [place] [address]   Gram stain     Status: None   Collection Time: 01/13/22  4:30 PM   Specimen: PATH Cytology Peritoneal fluid  Result Value Ref Range Status   Specimen Description   Final    PERITONEAL Performed at [facility], [address] [place], [place] [address]    Special Requests NO CULTURE ORDERED, CLT  Final   Gram Stain   Final    WBC PRESENT,BOTH PMN AND MONONUCLEAR NO ORGANISMS SEEN CYTOSPIN SMEAR Performed at [facility], [address], [place], [place] [address]    Report Status 01/13/2022 FINAL  Final    Antimicrobials: Anti-infectives (From admission, onward)    Start     Dose/Rate Route Frequency Ordered Stop   01/13/22 1000  cefTRIAXone (ROCEPHIN) 2 g in sodium chloride 0.9 % 100 mL IVPB        2 g 200 mL/hr over 30 Minutes Intravenous Every 24 hours 01/13/22 0550     01/12/22 2300  metroNIDAZOLE (FLAGYL) IVPB 500 mg        500 mg 100 mL/hr over 60 Minutes Intravenous Every 12 hours 01/12/22 2255     01/12/22 2045  cefTRIAXone (ROCEPHIN) 1 g in sodium chloride 0.9 % 100 mL IVPB        1 g 200 mL/hr over  30 Minutes Intravenous  Once 01/12/22 2042 01/12/22 2145      Culture/Microbiology    Component Value Date/Time   SDES  01/13/2022 1630    PERITONEAL Performed at [facility], [address] [place], [place] [address]    SPECREQUEST NO CULTURE ORDERED, CLT 01/13/2022 1630   CULT  01/12/2022 2310    NO GROWTH 3 DAYS Performed at [facility], [address], [place], [place] [address]    REPTSTATUS 01/13/2022 FINAL 01/13/2022 1630    Other culture-see note  Radiology Studies: DG Abd 2 Views  Result Date: 01/15/2022 CLINICAL DATA:  70 year old female presents for evaluation of a distension. EXAM: ABDOMEN - 2 VIEW COMPARISON:  CT evaluation from January 13, 2022. FINDINGS: Possible trace LEFT-sided pleural effusion. RIGHT hemidiaphragm is elevated as on recent CT imaging. Bowel gas  pattern without signs of obstruction. Scattered loops of gas-filled bowel throughout the abdomen including rectal gas. Diffuse "graininess" of the abdomen with loss of LEFT and RIGHT flank stripe in this patient with ascites on recent imaging. Based on the scout image this is likely reduced in volume compared to recent imaging based on the appearance of bowel, less centralized in the abdomen on the current study. On limited assessment there is no acute skeletal finding. No free air beneath either the RIGHT or LEFT hemidiaphragm IMPRESSION: 1. Findings suggest residual ascites, reduced volume suspected since previous imaging. 2. No signs of bowel obstruction. 3. Possible trace LEFT-sided pleural effusion. 4. Study limited by portable technique and body habitus as well as patient positioning. No gross signs of free air. Electronically Signed   By: Donzetta Kohut M.D.   On: 01/15/2022 15:29     LOS: 3 days   Lanae Boast, MD [facility]  01/16/2022, 9:00 AM

## 2022-01-16 NOTE — Progress Notes (Signed)
Daily Progress Note   Patient Name: Hannah Blake       Date: 01/16/2022 DOB: May 09, 1951  Age: 71 y.o. MRN#: [id number] Attending Physician: Lanae Boast, MD Primary Care Physician: Barbette Merino, NP Admit Date: 01/12/2022  Reason for Consultation/Follow-up: Establishing goals of care  Subjective: Awake alert oriented sitting up in bed grandson Casimiro Needle at bedside patient denies any abdominal discomfort states that she is able to lay down on her right side now, no chest discomfort no shortness of breath.  States that she feels better than yesterday.  Asks when she is going home.  No episodes of hemoptysis or hematemesis or any other bleeding noted.  Hemoglobin is 9 g/dL.  Length of Stay: 3  Current Medications: Scheduled Meds:   (feeding supplement) PROSource Plus  30 mL Oral BID BM   Chlorhexidine Gluconate Cloth  6 each Topical Daily   feeding supplement  237 mL Oral BID BM   furosemide  20 mg Oral Daily   LORazepam  0.5 mg Oral QHS   multivitamin with minerals  1 tablet Oral Daily   pantoprazole  40 mg Intravenous Q12H   spironolactone  50 mg Oral Daily    Continuous Infusions:  sodium chloride 10 mL/hr at 01/16/22 0400   cefTRIAXone (ROCEPHIN)  IV Stopped (01/15/22 1140)   metronidazole Stopped (01/16/22 0021)   octreotide (SANDOSTATIN) 500 mcg in sodium chloride 0.9 % 250 mL (2 mcg/mL) infusion 50 mcg/hr (01/16/22 0400)    PRN Meds: sodium chloride, diphenhydrAMINE, mouth rinse  Physical Exam         Awake alert oriented Resting comfortably No acute distress No abdominal discomfort today Regular work of breathing S1-S2  Vital Signs: BP (!) 93/37   Pulse 81   Temp 98.6 F (37 C) (Oral)   Resp (!) 22   Ht 4\' 11"  (1.499 m)   Wt 46.2 kg   SpO2 97%   BMI 20.57 kg/m   SpO2: SpO2: 97 % O2 Device: O2 Device: Room Air O2 Flow Rate: O2 Flow Rate (L/min): 2 L/min  Intake/output summary:  Intake/Output Summary (Last 24 hours) at 01/16/2022 0856 Last data filed at 01/16/2022 0400 Gross per 24 hour  Intake 1933.57 ml  Output --  Net 1933.57 ml   LBM: Last BM Date : 01/15/22 Baseline Weight: Weight: 54.7  kg Most recent weight: Weight: 46.2 kg       Palliative Assessment/Data:      Patient Active Problem List   Diagnosis Date Noted   Esophageal varices in alcoholic cirrhosis (HCC) 01/15/2022   Multiple gastric ulcers 01/15/2022   Hyperkalemia 01/15/2022   Goals of care, counseling/discussion 01/15/2022   Chronic systolic CHF (congestive heart failure) (HCC) 01/15/2022   Decompensated hepatic cirrhosis (HCC) 01/13/2022   Acute GI bleeding 01/13/2022   ABLA (acute blood loss anemia) 01/13/2022   Bell's palsy 07/01/2021   Acute systolic heart failure (HCC)    Protein-calorie malnutrition, severe 05/21/2021   Cirrhosis of liver with ascites (HCC) 05/21/2021   UTI (urinary tract infection) due to urinary indwelling Foley catheter (HCC) 05/21/2021   Acute urinary retention 05/21/2021   AKI (acute kidney injury) (HCC) 05/20/2021    Palliative Care Assessment & Plan   Patient Profile:   Assessment: 71 year old lady who lives at home with her daughter in [place], [place], has 2 grandchildren who are also involved with her care.  Patient admitted to hospital medicine service with GI colleagues following for GI bleed with hematemesis, known history of cirrhosis.  Hemoglobin was 6.1 at the time of admission.  Patient underwent EGD on 6/27 which showed large esophageal varices as well as multiple gastric ulcers, she underwent variceal band ligation.  She remains on Protonix octreotide and antibiotics.  Also on Lasix and spironolactone.  AMA and ASMA are being checked.  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  Has had ascites requiring paracentesis in this hospitalization.   Palliative medicine team has been consulted for CODE STATUS and broad goals of care discussions.   Recommendations/Plan: Family meeting was held with the patient's grandson Casimiro Needle as well as the patient for complex decision making and broad goals of care discussions including CODE STATUS discussions.  Patient and family elect for continuation of full code/full scope of services.  Explained to the best of my ability about the patient's current life limiting illness of cirrhosis of the liver and its subsequent sequelae.  Topic of advance care planning was brought up, discussed about paperwork with regards to completion of advance directives delineating the patient's wishes.  Patient wishes to elect her grandson Petra Kuba to be her healthcare power of attorney agent.  Will request chaplain assistance for completing documentation as per patient wishes.  Patient wishes to go home to her family towards the end of this hospitalization with home health care.  At present, their goals are not palliative-focused.  Code Status:    Code Status Orders  (From admission, onward)           Start     Ordered   01/13/22 0549  Full code  Continuous        01/13/22 0550           Code Status History     Date Active Date Inactive Code Status Order ID Comments User Context   05/20/2021 2210 05/23/2021 2323 Full Code [id number]  Darlin Drop, DO Inpatient       Prognosis: Guarded  Discharge Planning: Home with Home Health  Care plan was discussed with patient and grandson Casimiro Needle at bedside.  Thank you for allowing the Palliative Medicine Team to assist in the care of this patient.   Time In: 8 Time Out: 8.50 Total Time 50 Prolonged Time Billed  no  Greater than 50%  of this time was spent counseling and coordinating care related to the above assessment and plan.  Rosalin Hawking, MD  Please contact Palliative Medicine Team phone at [phone] for questions and concerns.

## 2022-01-17 LAB — CBC
HCT: 29.3 % — ABNORMAL LOW (ref 36.0–46.0)
Hemoglobin: 9.2 g/dL — ABNORMAL LOW (ref 12.0–15.0)
MCH: 26.7 pg (ref 26.0–34.0)
MCHC: 31.4 g/dL (ref 30.0–36.0)
MCV: 84.9 fL (ref 80.0–100.0)
Platelets: 184 10*3/uL (ref 150–400)
RBC: 3.45 MIL/uL — ABNORMAL LOW (ref 3.87–5.11)
RDW: 18.3 % — ABNORMAL HIGH (ref 11.5–15.5)
WBC: 9.5 10*3/uL (ref 4.0–10.5)
nRBC: 0 % (ref 0.0–0.2)

## 2022-01-17 LAB — COMPREHENSIVE METABOLIC PANEL WITH GFR
ALT: 25 U/L (ref 0–44)
AST: 48 U/L — ABNORMAL HIGH (ref 15–41)
Albumin: 2.8 g/dL — ABNORMAL LOW (ref 3.5–5.0)
Alkaline Phosphatase: 355 U/L — ABNORMAL HIGH (ref 38–126)
Anion gap: 5 (ref 5–15)
BUN: 15 mg/dL (ref 8–23)
CO2: 25 mmol/L (ref 22–32)
Calcium: 8.2 mg/dL — ABNORMAL LOW (ref 8.9–10.3)
Chloride: 102 mmol/L (ref 98–111)
Creatinine, Ser: 0.6 mg/dL (ref 0.44–1.00)
GFR, Estimated: >60 mL/min
Glucose, Bld: 110 mg/dL — ABNORMAL HIGH (ref 70–99)
Potassium: 4.3 mmol/L (ref 3.5–5.1)
Sodium: 132 mmol/L — ABNORMAL LOW (ref 135–145)
Total Bilirubin: 1.9 mg/dL — ABNORMAL HIGH (ref 0.3–1.2)
Total Protein: 6.7 g/dL (ref 6.5–8.1)

## 2022-01-17 LAB — GLUCOSE, CAPILLARY
Glucose-Capillary: 166 mg/dL — ABNORMAL HIGH (ref 70–99)
Glucose-Capillary: 141 mg/dL — ABNORMAL HIGH (ref 70–99)

## 2022-01-17 LAB — VITAMIN A: Vitamin A (Retinoic Acid): 16.1 ug/dL — ABNORMAL LOW (ref 22.0–69.5)

## 2022-01-17 MED ORDER — FUROSEMIDE 20 MG PO TABS
20.0000 mg | ORAL_TABLET | Freq: Every day | ORAL | 0 refills | Status: DC
Start: 2022-01-18 — End: 2022-05-01

## 2022-01-17 MED ORDER — SPIRONOLACTONE 50 MG PO TABS
50.0000 mg | ORAL_TABLET | Freq: Every day | ORAL | 0 refills | Status: DC
Start: 2022-01-18 — End: 2022-05-01

## 2022-01-17 MED ORDER — ADULT MULTIVITAMIN W/MINERALS CH: 1.0000 | ORAL_TABLET | Freq: Every day | ORAL | 0 refills | Status: AC

## 2022-01-17 MED ORDER — PANTOPRAZOLE SODIUM 40 MG PO TBEC: 40.0000 mg | DELAYED_RELEASE_TABLET | Freq: Two times a day (BID) | ORAL | 0 refills | Status: DC

## 2022-01-17 MED ORDER — URSODIOL 300 MG PO CAPS: 300.0000 mg | ORAL_CAPSULE | Freq: Two times a day (BID) | ORAL | 0 refills | Status: AC

## 2022-01-17 NOTE — Discharge Summary (Signed)
Physician Discharge Summary  Hannah Blake [id number] DOB: [birth year] DOA: 01/12/2022  PCP: Vevelyn Francois, NP  Admit date: 01/12/2022 Discharge date: 01/17/2022 Recommendations for Outpatient Follow-up:  Follow up with PCP in 1 weeks-call for appointment Please obtain BMP/CBC in one week  Discharge Dispo: home Discharge Condition: Stable Code Status:   Code Status: Full Code Diet recommendation:  Diet Order             Diet 2 gram sodium Room service appropriate? Yes; Fluid consistency: Thin  Diet effective now                    Brief/Interim Summary: 71 year old Non English speaking, speaks Vietnamese-but able to communicate reasonably recently diagnosed with liver cirrhosis and CHF with ejection fraction 45 to 50%, not taking medications for more than 6 months presented to ER with hematemesis, 3 WK of increased abdominal swelling.   She was severely anemic 6.6 g with significant abdominal distention -CT scan with cirrhosis and large volume ascites. S/P 1 unit PRBC transfusion, Protonix and octreotide and admitted to the hospital for GI work-up, and further management AKI/decompensated liver cirrhosis/symptomatic anemia.At least since 2008, patient retired and had been drinking 2 beers every day until November 2022. EGD done-found to have: Multiple clean-based nonbleeding gastric ulcers, large more than 5 mm esophageal varices completely eradicated, banded x 2, gastric biopsies performed. 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 AMA 123. Tolerating po. Gi stopped drip and advised" -ursodiol 300 mg twice daily,Lasix 20 mg and spironolactone 50 mg,Avoid beta-blocker for now because of borderline low blood pressure-Protonix 40 mg twice a day until repeat EGD is done. Follow-up in GI clinic in 4 to 6 weeks.Recommend repeat EGD in 2 months for variceal band ligation as well as to document healing of gastric ulcers.  Patient  was monitored overnight, remains hemodynamically stable although soft blood pressure.  She will be discharged home with outpatient follow-up with GI, consider outpatient palliative care.   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 AMA 123. Tolerating po. Gi stopped drip and advised" -ursodiol 300 mg twice daily,Lasix 20 mg and spironolactone 50 mg,Avoid beta-blocker for now because of borderline low blood pressure-Protonix 40 mg twice a day until repeat EGD is done. Follow-up in GI clinic in 4 to 6 weeks.Recommend repeat EGD in 2 months for variceal band ligation as well as to document healing of gastric ulcers.  Recent Labs  Lab 01/14/22 0637 01/14/22 1628 01/15/22 0637 01/16/22 0309 01/17/22 0952  HGB 8.7* 9.1* 8.9* 9.0* 9.2*  HCT 27.8* 29.5* 29.2* 28.9*  29.3*    Severe malnutrition in the context of liver cirrhosis coronary disease Nutrition Problem: Severe Malnutrition Etiology: chronic illness  (cirrhosis) Signs/Symptoms: severe fat depletion, severe muscle depletion Interventions: Ensure Enlive (each supplement provides 350kcal and 20 grams of protein), MVI, Prostat   AKI-resolved Hyperkalemia-resolved Chronic systolic CHF-not in failure.  Monitor fluid status on Lasix and Aldactone Goals of care, counseling/discussion:Overall poor prognosis palliative care following, remains full code. Spoke to [employer] regarding- rx and dc plan and repeat egd need and follow up needs.  Consults: GI Subjective: Aaox3, resting on bedside bench, feels ready for home   Discharge Exam: Vitals:   01/17/22 0625 01/17/22 0934  BP: (!) 99/55 105/67  Pulse: 84 79  Resp: 18   Temp: 98 F (36.7 C)   SpO2: 99%    General: Pt is alert, awake, not in acute distress Cardiovascular: RRR, S1/S2 +, no rubs, no gallops Respiratory: CTA bilaterally, no wheezing, no rhonchi Abdominal: Soft, NT, ND, bowel sounds + Extremities: no edema, no cyanosis  Discharge Instructions  Discharge Instructions     Discharge instructions   Complete by: As directed    Follow up with gi for repeat egd  Please call call MD or return to ER for similar or worsening recurring problem that brought you to hospital or if any fever,nausea/vomiting,abdominal pain, uncontrolled pain, chest pain,  shortness of breath or any other alarming symptoms.  Please follow-up your doctor as instructed in a week time and call the office for appointment.  Please avoid alcohol, smoking, or any other illicit substance and maintain healthy habits including taking your regular medications as prescribed.  You were cared for by a hospitalist during your hospital stay. If you have any questions about your discharge medications or the care you received while you were in the hospital after you are discharged, you can call the unit and ask to speak with the hospitalist on call if the hospitalist that took care of you is not available.  Once you  are discharged, your primary care physician will handle any further medical issues. Please note that NO REFILLS for any discharge medications will be authorized once you are discharged, as it is imperative that you return to your primary care physician (or establish a relationship with a primary care physician if you do not have one) for your aftercare needs so that they can reassess your need for medications and monitor your lab values   Increase activity slowly   Complete by: As directed       Allergies as of 01/17/2022   No Known Allergies      Medication List     STOP taking these medications    diphenhydramine-acetaminophen 25-500 MG Tabs tablet Commonly known as: TYLENOL PM       TAKE these medications    furosemide 20 MG tablet Commonly known as: LASIX Take 1 tablet (20 mg total) by mouth daily. Start taking on: January 18, 2022   multivitamin with minerals Tabs tablet Take 1 tablet by mouth daily. Start taking on: January 18, 2022   pantoprazole 40 MG tablet Commonly known as: Protonix Take 1 tablet (40 mg total) by mouth 2 (two) times daily.   spironolactone 50 MG tablet Commonly known as: ALDACTONE Take 1 tablet (50 mg total) by mouth daily. Start taking on: January 18, 2022   ursodiol 300 MG capsule Commonly known as: ACTIGALL Take 1 capsule (300 mg total) by mouth 2 (two) times daily.  Follow-up Information     Gastroenterology, [facility] Sprang. Schedule an appointment as soon as possible for a visit in 4 week(s).   Why: Follow-up for liver cirrhosis and gastric ulcers Contact information: [address] [place] [address] [phone]         Barbette Merino, NP Follow up in 1 week(s).   Specialty: Adult Health Nurse Practitioner Contact information: [address] [place] [address] [phone]         Little Ishikawa, MD .   Specialties: Cardiology, Radiology Contact information: [address] [place] [address] [phone]                No Known Allergies  The results of significant diagnostics from this hospitalization (including imaging, microbiology, ancillary and laboratory) are listed below for reference.    Microbiology: Recent Results (from the past 240 hour(s))  Blood culture (routine x 2)     Status: None (Preliminary result)   Collection Time: 01/12/22 10:10 PM   Specimen: BLOOD  Result Value Ref Range Status   Specimen Description   Final    BLOOD BLOOD RIGHT HAND Performed at [facility], [address] [place], [place] [address]    Special Requests   Final    BOTTLES DRAWN AEROBIC AND ANAEROBIC Blood Culture results may not be optimal due to an inadequate volume of blood received in culture bottles Performed at [facility], [address] [place], [place] [address]    Culture   Final    NO GROWTH 3 DAYS Performed at [facility], [address] [place], [place] [address]    Report Status PENDING  Incomplete  Body fluid culture w Gram Stain     Status: None   Collection Time: 01/12/22 10:10 PM   Specimen: Peritoneal Cavity; Peritoneal Fluid  Result Value Ref Range Status   Specimen Description   Final    PERITONEAL CAVITY Performed at [facility], [address] [place], [place] [address]    Special Requests   Final    NONE Performed at [facility], [address] [place], [place] [address]    Gram Stain   Final    WBC PRESENT, PREDOMINANTLY MONONUCLEAR NO ORGANISMS SEEN CYTOSPIN SMEAR    Culture   Final    NO GROWTH 3 DAYS Performed at [facility], [address], [place], [place] [address]    Report Status 01/16/2022 FINAL  Final  Blood culture (routine x 2)     Status: None (Preliminary result)   Collection Time: 01/12/22 11:10 PM   Specimen: BLOOD  Result Value Ref Range Status   Specimen Description   Final    BLOOD BLOOD  LEFT FOREARM Performed at [facility], [address] [place], [place] [address]    Special Requests   Final    BOTTLES DRAWN AEROBIC AND ANAEROBIC Blood Culture adequate volume Performed at [facility], [address], [place], [place] [address]    Culture   Final    NO GROWTH 3 DAYS Performed at [facility], [address], [place], [place] [address]    Report Status PENDING  Incomplete  MRSA Next Gen by PCR, Nasal     Status: None   Collection Time: 01/13/22  6:03 AM   Specimen: Nasal Mucosa; Nasal Swab  Result Value Ref Range Status   MRSA  by PCR Next Gen NOT DETECTED NOT DETECTED Final    Comment: (NOTE) The GeneXpert MRSA Assay (FDA approved for NASAL specimens only), is one component of a comprehensive MRSA colonization surveillance program. It is not intended to diagnose MRSA infection nor to guide or monitor treatment for MRSA infections. Test performance is not FDA approved in patients less than 59 years old. Performed at [facility], [place] [address], [place], [place] [address]   Gram stain     Status: None   Collection Time: 01/13/22  4:30 PM   Specimen: PATH Cytology Peritoneal fluid  Result Value Ref Range Status   Specimen Description   Final    PERITONEAL Performed at [place] [address], [place], [place] [address]    Special Requests NO CULTURE ORDERED, CLT  Final   Gram Stain   Final    WBC PRESENT,BOTH PMN AND MONONUCLEAR NO ORGANISMS SEEN CYTOSPIN SMEAR Performed at [facility], [place] [address] [place], [place] [address]    Report Status 01/13/2022 FINAL  Final    Procedures/Studies: DG Abd 2 Views  Result Date: 01/15/2022 CLINICAL DATA:  71 year old female presents for evaluation of a distension. EXAM: ABDOMEN - 2 VIEW COMPARISON:  CT evaluation from January 13, 2022. FINDINGS: Possible trace LEFT-sided pleural effusion. RIGHT hemidiaphragm is elevated  as on recent CT imaging. Bowel gas pattern without signs of obstruction. Scattered loops of gas-filled bowel throughout the abdomen including rectal gas. Diffuse "graininess" of the abdomen with loss of LEFT and RIGHT flank stripe in this patient with ascites on recent imaging. Based on the scout image this is likely reduced in volume compared to recent imaging based on the appearance of bowel, less centralized in the abdomen on the current study. On limited assessment there is no acute skeletal finding. No free air beneath either the RIGHT or LEFT hemidiaphragm IMPRESSION: 1. Findings suggest residual ascites, reduced volume suspected since previous imaging. 2. No signs of bowel obstruction. 3. Possible trace LEFT-sided pleural effusion. 4. Study limited by portable technique and body habitus as well as patient positioning. No gross signs of free air. Electronically Signed   By: Zetta Bills M.D.   On: 01/15/2022 15:29   US Paracentesis  Result Date: 01/13/2022 INDICATION: Cirrhosis; ascites EXAM: ULTRASOUND GUIDED right lower quadrant PARACENTESIS MEDICATIONS: 8 cc of 1% lidocaine COMPLICATIONS: None immediate. PROCEDURE: Informed written consent was obtained from the patient after a discussion of the risks, benefits and alternatives to treatment. A timeout was performed prior to the initiation of the procedure. Initial ultrasound scanning demonstrates a large amount of ascites within the right lower abdominal quadrant. The right lower abdomen was prepped and draped in the usual sterile fashion. 1% lidocaine was used for local anesthesia. Following this, a 19 gauge, 7-cm, Yueh catheter was introduced. An ultrasound image was saved for documentation purposes. The paracentesis was performed. The catheter was removed and a dressing was applied. The patient tolerated the procedure well without immediate post procedural complication. FINDINGS: A total of approximately 8 L of thin yellow fluid was removed. Samples  were sent laboratory as requested by the clinical team IMPRESSION: Successful ultrasound-guided paracentesis yielding 8 liters of peritoneal fluid. PLAN: If the patient eventually requires >/=2 paracenteses in a 30 day period, candidacy for formal evaluation by the [facility] will be assessed. Electronically Signed   By: Miachel Roux M.D.   On: 01/13/2022 16:43   CT ABDOMEN PELVIS W  CONTRAST  Result Date: 01/13/2022 CLINICAL DATA:  Abdominal pain, acute, nonlocalized ascites, sbp, emesis, EXAM: CT ABDOMEN AND PELVIS WITH CONTRAST TECHNIQUE: Multidetector CT imaging of the abdomen and pelvis was performed using the standard protocol following bolus administration of intravenous contrast. RADIATION DOSE REDUCTION: This exam was performed according to the departmental dose-optimization program which includes automated exposure control, adjustment of the mA and/or kV according to patient size and/or use of iterative reconstruction technique. CONTRAST:  44mL OMNIPAQUE IOHEXOL 300 MG/ML  SOLN COMPARISON:  05/01/2021 FINDINGS: Lower chest: No acute abnormality. Hiatal hernia containing ascites. Hepatobiliary: Slightly nodular contours of the liver compatible with cirrhosis, stable. No focal hepatic abnormality. Gallbladder contracted, grossly unremarkable. Pancreas: No focal abnormality or ductal dilatation. Spleen: Splenomegaly with a craniocaudal length of 13.7 cm. Adrenals/Urinary Tract: No adrenal abnormality. No focal renal abnormality. No stones or hydronephrosis. Urinary bladder is unremarkable. Stomach/Bowel: Stomach, large and small bowel grossly unremarkable. Vascular/Lymphatic: Aortic atherosclerosis. No evidence of aneurysm or adenopathy. Reproductive: Small cyst in the left ovary measuring 2.5 cm, stable since prior study. Uterus and right adnexa unremarkable. Other: Large volume ascites in the abdomen and pelvis. Musculoskeletal: Chronic compression  fractures at L5 and L2, T8 and T6. New compression fracture since prior study at T9, age indeterminate. IMPRESSION: Changes of cirrhosis. Associated splenomegaly and large volume ascites. Aortic atherosclerosis. Chronic compression fractures at T6, T8, L2 and L5. Age-indeterminate compression fracture at T9. Electronically Signed   By: Rolm Baptise M.D.   On: 01/13/2022 01:57   DG Chest Portable 1 View  Result Date: 01/12/2022 CLINICAL DATA:  Screening.  Next EXAM: PORTABLE CHEST 1 VIEW COMPARISON:  None Available. FINDINGS: The heart size and mediastinal contours are within normal limits. Both lungs are clear. The visualized skeletal structures are unremarkable. IMPRESSION: No active disease. Electronically Signed   By: Ronney Asters M.D.   On: 01/12/2022 20:41    Labs: BNP (last 3 results) Recent Labs    05/20/21 2220  BNP [id number]*   Basic Metabolic Panel: Recent Labs  Lab 01/13/22 0709 01/14/22 0637 01/15/22 0637 01/16/22 0309 01/17/22 0539  NA 136 139 139 137 132*  K 5.5* 4.6 4.5 4.5 4.3  CL 106 108 110 105 102  CO2 23 23 23 26 25   GLUCOSE 129* 106* 105* 132* 110*  BUN 49* 35* 19 14 15   CREATININE 1.00 0.70 0.67 0.74 0.60  CALCIUM 8.6* 8.5* 8.4* 8.5* 8.2*   Liver Function Tests: Recent Labs  Lab 01/13/22 0709 01/14/22 0637 01/15/22 0637 01/16/22 0309 01/17/22 0539  AST 57* 51* 59* 51* 48*  ALT 31 27 27 27 25   ALKPHOS 336* 275* 344* 345* 355*  BILITOT 3.0* 3.0* 2.3* 1.7* 1.9*  PROT 6.6 6.3* 6.3* 6.4* 6.7  ALBUMIN 2.9* 3.0* 2.7* 2.6* 2.8*   No results for input(s): "LIPASE", "AMYLASE" in the last 168 hours. Recent Labs  Lab 01/12/22 2051  AMMONIA 47*   CBC: Recent Labs  Lab 01/12/22 1955 01/13/22 0709 01/13/22 2219 01/14/22 [id number] 01/14/22 1628 01/15/22 0637 01/16/22 0309 01/17/22 0952  WBC 14.3* 9.6  --  8.3  --   --  10.0 9.5  NEUTROABS 11.5*  --   --   --   --   --   --   --   HGB 6.6* 6.1*   < > 8.7* 9.1* 8.9* 9.0* 9.2*  HCT 22.3* 20.2*   < > 27.8*  29.5* 29.2* 28.9* 29.3*  MCV 86.4 84.9  --  83.2  --   --  85.5 84.9  PLT 323 167  --  156  --   --  180 184   < > = values in this interval not displayed.   Cardiac Enzymes: No results for input(s): "CKTOTAL", "CKMB", "CKMBINDEX", "TROPONINI" in the last 168 hours. BNP: Invalid input(s): "POCBNP" CBG: Recent Labs  Lab 01/16/22 0504 01/16/22 1212 01/16/22 1659 01/16/22 2350 01/17/22 0627  GLUCAP 101* 151* 100* 106* 166*   D-Dimer No results for input(s): "DDIMER" in the last 72 hours. Hgb A1c No results for input(s): "HGBA1C" in the last 72 hours. Lipid Profile No results for input(s): "CHOL", "HDL", "LDLCALC", "TRIG", "CHOLHDL", "LDLDIRECT" in the last 72 hours. Thyroid function studies No results for input(s): "TSH", "T4TOTAL", "T3FREE", "THYROIDAB" in the last 72 hours.  Invalid input(s): "FREET3" Anemia work up No results for input(s): "VITAMINB12", "FOLATE", "FERRITIN", "TIBC", "IRON", "RETICCTPCT" in the last 72 hours. Urinalysis    Component Value Date/Time   COLORURINE RED (A) 05/20/2021 1201   APPEARANCEUR TURBID (A) 05/20/2021 1201   LABSPEC  05/20/2021 1201    TEST NOT REPORTED DUE TO COLOR INTERFERENCE OF URINE PIGMENT   PHURINE  05/20/2021 1201    TEST NOT REPORTED DUE TO COLOR INTERFERENCE OF URINE PIGMENT   GLUCOSEU (A) 05/20/2021 1201    TEST NOT REPORTED DUE TO COLOR INTERFERENCE OF URINE PIGMENT   HGBUR (A) 05/20/2021 1201    TEST NOT REPORTED DUE TO COLOR INTERFERENCE OF URINE PIGMENT   BILIRUBINUR (A) 05/20/2021 1201    TEST NOT REPORTED DUE TO COLOR INTERFERENCE OF URINE PIGMENT   KETONESUR (A) 05/20/2021 1201    TEST NOT REPORTED DUE TO COLOR INTERFERENCE OF URINE PIGMENT   PROTEINUR (A) 05/20/2021 1201    TEST NOT REPORTED DUE TO COLOR INTERFERENCE OF URINE PIGMENT   NITRITE (A) 05/20/2021 1201    TEST NOT REPORTED DUE TO COLOR INTERFERENCE OF URINE PIGMENT   LEUKOCYTESUR (A) 05/20/2021 1201    TEST NOT REPORTED DUE TO COLOR INTERFERENCE OF  URINE PIGMENT   Sepsis Labs Recent Labs  Lab 01/13/22 0709 01/14/22 0637 01/16/22 0309 01/17/22 0952  WBC 9.6 8.3 10.0 9.5   Microbiology Recent Results (from the past 240 hour(s))  Blood culture (routine x 2)     Status: None (Preliminary result)   Collection Time: 01/12/22 10:10 PM   Specimen: BLOOD  Result Value Ref Range Status   Specimen Description   Final    BLOOD BLOOD RIGHT HAND Performed at [facility], [place] [address], [place], [place] [address]    Special Requests   Final    BOTTLES DRAWN AEROBIC AND ANAEROBIC Blood Culture results may not be optimal due to an inadequate volume of blood received in culture bottles Performed at [place] [address] [place], [place] [address]    Culture   Final    NO GROWTH 3 DAYS Performed at [facility], [place] [address], [place], [place] [address]    Report Status PENDING  Incomplete  Body fluid culture w Gram Stain     Status: None   Collection Time: 01/12/22 10:10 PM   Specimen: Peritoneal Cavity; Peritoneal Fluid  Result Value Ref Range Status   Specimen Description   Final    PERITONEAL CAVITY Performed at [place] [address], [place], [place] [address]    Special Requests   Final    NONE Performed at [facility], [place] [address], [place],  [address]  Gram Stain   Final    WBC PRESENT, PREDOMINANTLY MONONUCLEAR NO ORGANISMS SEEN CYTOSPIN SMEAR    Culture   Final    NO GROWTH 3 DAYS Performed at [facility], [address] [place], [place] [address]    Report Status 01/16/2022 FINAL  Final  Blood culture (routine x 2)     Status: None (Preliminary result)   Collection Time: 01/12/22 11:10 PM   Specimen: BLOOD  Result Value Ref Range Status   Specimen Description   Final    BLOOD BLOOD LEFT FOREARM Performed at [facility], [address], [place], [place] [address]     Special Requests   Final    BOTTLES DRAWN AEROBIC AND ANAEROBIC Blood Culture adequate volume Performed at [facility], [address], [place], [place] [address]    Culture   Final    NO GROWTH 3 DAYS Performed at [facility], [address] [place], [place] [address]    Report Status PENDING  Incomplete  MRSA Next Gen by PCR, Nasal     Status: None   Collection Time: 01/13/22  6:03 AM   Specimen: Nasal Mucosa; Nasal Swab  Result Value Ref Range Status   MRSA by PCR Next Gen NOT DETECTED NOT DETECTED Final    Comment: (NOTE) The GeneXpert MRSA Assay (FDA approved for NASAL specimens only), is one component of a comprehensive MRSA colonization surveillance program. It is not intended to diagnose MRSA infection nor to guide or monitor treatment for MRSA infections. Test performance is not FDA approved in patients less than 79 years old. Performed at [facility], [address], [place], [place] [address]   Gram stain     Status: None   Collection Time: 01/13/22  4:30 PM   Specimen: PATH Cytology Peritoneal fluid  Result Value Ref Range Status   Specimen Description   Final    PERITONEAL Performed at [facility], [address], [place], [place] [address]    Special Requests NO CULTURE ORDERED, CLT  Final   Gram Stain   Final    WBC PRESENT,BOTH PMN AND MONONUCLEAR NO ORGANISMS SEEN CYTOSPIN SMEAR Performed at [facility], [address], [place], [place] [address]    Report Status 01/13/2022 FINAL  Final  Time coordinating discharge: 35 minutes  SIGNED: Lanae Boast, MD  [facility] 01/17/2022, 11:02 AM  If 7PM-7AM, please contact night-coverage [email or web address]

## 2022-01-17 NOTE — Progress Notes (Signed)
Chaplain received a referral to assist Hannah Blake with [facility] paperwork.  Chaplain spoke with Hannah Blake through [employer].  She clearly stated that she wants her grandchildren to be her healthcare agents and that she wants to fill out the paperwork when they return.  At that time, she gave verbal consent for them to be her interpreters.  Please page when family returns.  [address], Bcc Pager, [phone]

## 2022-01-17 NOTE — Progress Notes (Signed)
Chaplain assisted Liala and her grandsons with education around [place] and assisted them in getting the document notarized.  Chaplain enlisted the help of an interpreter as Sausha wished to assign her grandsons as Product manager.  Chaplain affirmed with the interpreter that Bessy was knowlegable about the document and that the document reflected her wishes. A copy was placed in her paper chart, and one was scanned and sent to [email or web address].  The original was returned to patient.  [address], Bcc Pager, [phone]

## 2022-01-17 NOTE — Progress Notes (Signed)
Patient is being discharged home. Wayland Denis is at bedside. Discharge instructions including new medications and where to get medications was went over. Both patient and grandson verbalized understanding.

## 2022-01-17 NOTE — Anesthesia Postprocedure Evaluation (Signed)
Anesthesia Post Note  Patient: Hannah Blake  Procedure(s) Performed: ESOPHAGOGASTRODUODENOSCOPY (EGD) BIOPSY ESOPHAGEAL BANDING     Patient location during evaluation: Endoscopy Anesthesia Type: MAC Level of consciousness: awake Pain management: pain level controlled Vital Signs Assessment: post-procedure vital signs reviewed and stable Respiratory status: spontaneous breathing Cardiovascular status: stable Postop Assessment: no apparent nausea or vomiting Anesthetic complications: no   No notable events documented.  Last Vitals:  Vitals:   01/17/22 0625 01/17/22 0934  BP: (!) 99/55 105/67  Pulse: 84 79  Resp: 18   Temp: 36.7 C   SpO2: 99%     Last Pain:  Vitals:   01/17/22 0934  TempSrc:   PainSc: 0-No pain                 Huston Foley

## 2022-01-18 LAB — CULTURE, BLOOD (ROUTINE X 2)
Culture: NO GROWTH
Culture: NO GROWTH
Special Requests: ADEQUATE

## 2022-01-27 NOTE — Progress Notes (Deleted)
Cardiology Office Note:    Date:  01/27/2022   ID:  Hannah Blake, DOB 1950-09-10, MRN [id number]  PCP:  Barbette Merino, NP  [facility] Cardiologist:  Little Ishikawa, MD  [facility] Electrophysiologist:  None   Chief Complaint: Hospital follow up  History of Present Illness:    Hannah Blake is a 71 y.o. female with a hx of bowel palsy, liver cirrhosis and CHF seen for hospital follow-up.  Seen by Dr. Bjorn Pippin November 2022 for CHF while admitted.  Echocardiogram with LV function of 45 to 50%.  Treated with Lasix and Aldactone.  Underwent paracentesis.  No follow-up since then.  Admitted 12/2021 for decompensated hepatic cirrhosis and severe anemia with hemoglobin of 6.6.  Underwent paracentesis with 8 L yelling.  EGD with 2 large esophageal varices that were banded and multiple nonbleeding gastric ulcers-gastric ulcer and varices likely the cause of patient's hematemesis and acute blood loss anemia. 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 AMA 123.    Past Medical History:  Diagnosis Date   Bell's palsy    Cirrhosis (HCC)    Coronary artery disease     Past Surgical History:  Procedure Laterality Date   BIOPSY  01/14/2022   Procedure: BIOPSY;  Surgeon: Kathi Der, MD;  Location: [facility];  Service: Gastroenterology;;   CLAVICLE SURGERY     ESOPHAGEAL BANDING  01/14/2022   Procedure: ESOPHAGEAL BANDING;  Surgeon: Kathi Der, MD;  Location: [facility];  Service: Gastroenterology;;   ESOPHAGOGASTRODUODENOSCOPY N/A 01/14/2022   Procedure: ESOPHAGOGASTRODUODENOSCOPY (EGD);  Surgeon: Kathi Der, MD;  Location: [facility];  Service: Gastroenterology;  Laterality: N/A;   FRACTURE SURGERY      Current Medications: No outpatient medications have been marked as taking for the 01/28/22 encounter (Appointment) with Manson Passey, PA.     Allergies:   Patient has no known allergies.    Social History   Socioeconomic History   Marital status: Single    Spouse name: Not on file   Number of children: Not on file   Years of education: Not on file   Highest education level: Not on file  Occupational History   Not on file  Tobacco Use   Smoking status: Every Day    Types: Cigarettes   Smokeless tobacco: Never  Vaping Use   Vaping Use: Never used  Substance and Sexual Activity   Alcohol use: Not Currently    Comment: weekly   Drug use: Never   Sexual activity: Not on file  Other Topics Concern   Not on file  Social History Narrative   Not on file   Social Determinants of Health   Financial Resource Strain: Not on file  Food Insecurity: Not on file  Transportation Needs: Not on file  Physical Activity: Not on file  Stress: Not on file  Social Connections: Not on file     Family History: The patient's Family history is unknown by patient.  ***  ROS:   Please see the history of present illness.    All other systems reviewed and are negative. ***  EKGs/Labs/Other Studies Reviewed:    The following studies were reviewed today:  Echo 05/2021  1. LVEF is 45 to 50% with hypokinesis/akinesis of the distal  inferior,distal lateral,distal inferoseptal walls . Left ventricular  ejection fraction, by estimation, is 40 to 45%. The left ventricle has  mildly decreased function. Indeterminate diastolic  filling due to E-A fusion.  2. Right ventricular systolic function is normal. The right ventricular  size is normal.   3. Left atrial size was mildly dilated.   4. The mitral valve is normal in structure. Trivial mitral valve  regurgitation.   5. The aortic valve is normal in structure. Aortic valve regurgitation is  mild.   EKG:  EKG is *** ordered today.  The ekg ordered today demonstrates ***  Recent Labs: 05/20/2021: B Natriuretic Peptide 689.8 05/21/2021: Magnesium 2.2 01/17/2022: ALT 25; BUN 15; Creatinine, Ser 0.60; Hemoglobin 9.2; Platelets 184;  Potassium 4.3; Sodium 132  Recent Lipid Panel No results found for: "CHOL", "TRIG", "HDL", "CHOLHDL", "VLDL", "LDLCALC", "LDLDIRECT"   Risk Assessment/Calculations:   {Does this patient have ATRIAL FIBRILLATION?:[phone]}   Physical Exam:    VS:  There were no vitals taken for this visit.    Wt Readings from Last 3 Encounters:  01/16/22 101 lb 13.6 oz (46.2 kg)  05/27/21 104 lb (47.2 kg)  05/23/21 130 lb 4.7 oz (59.1 kg)     GEN: *** Well nourished, well developed in no acute distress HEENT: Normal NECK: No JVD; No carotid bruits LYMPHATICS: No lymphadenopathy CARDIAC: ***RRR, no murmurs, rubs, gallops RESPIRATORY:  Clear to auscultation without rales, wheezing or rhonchi  ABDOMEN: Soft, non-tender, non-distended MUSCULOSKELETAL:  No edema; No deformity  SKIN: Warm and dry NEUROLOGIC:  Alert and oriented x 3 PSYCHIATRIC:  Normal affect   ASSESSMENT AND PLAN:    ***  2. ***  Medication Adjustments/Labs and Tests Ordered: Current medicines are reviewed at length with the patient today.  Concerns regarding medicines are outlined above.  No orders of the defined types were placed in this encounter.  No orders of the defined types were placed in this encounter.   There are no Patient Instructions on file for this visit.   Lorelei Pont, [place]  01/27/2022 11:25 AM    [facility]

## 2022-01-28 ENCOUNTER — Ambulatory Visit: Payer: Medicaid Other | Admitting: Physician Assistant

## 2022-04-16 ENCOUNTER — Emergency Department (HOSPITAL_COMMUNITY)
Admission: EM | Admit: 2022-04-16 | Discharge: 2022-04-16 | Disposition: A | Discharge status: Home / Self Care | Payer: Medicaid Other | Attending: Emergency Medicine | Admitting: Emergency Medicine

## 2022-04-16 ENCOUNTER — Other Ambulatory Visit: Payer: Self-pay

## 2022-04-16 DIAGNOSIS — K7031 Alcoholic cirrhosis of liver with ascites: Secondary | ICD-10-CM | POA: Insufficient documentation

## 2022-04-16 DIAGNOSIS — K7460 Unspecified cirrhosis of liver: Secondary | ICD-10-CM

## 2022-04-16 DIAGNOSIS — R1084 Generalized abdominal pain: Secondary | ICD-10-CM | POA: Insufficient documentation

## 2022-04-16 DIAGNOSIS — R630 Anorexia: Secondary | ICD-10-CM | POA: Insufficient documentation

## 2022-04-16 LAB — COMPREHENSIVE METABOLIC PANEL
ALT: 44 U/L (ref 0–44)
AST: 83 U/L — ABNORMAL HIGH (ref 15–41)
Albumin: 2.9 g/dL — ABNORMAL LOW (ref 3.5–5.0)
Alkaline Phosphatase: 587 U/L — ABNORMAL HIGH (ref 38–126)
Anion gap: 8 (ref 5–15)
BUN: 22 mg/dL (ref 8–23)
CO2: 21 mmol/L — ABNORMAL LOW (ref 22–32)
Calcium: 8.8 mg/dL — ABNORMAL LOW (ref 8.9–10.3)
Chloride: 105 mmol/L (ref 98–111)
Creatinine, Ser: 0.66 mg/dL (ref 0.44–1.00)
GFR, Estimated: >60 mL/min (ref >60)
Glucose, Bld: 96 mg/dL (ref 70–99)
Potassium: 4.3 mmol/L (ref 3.5–5.1)
Sodium: 134 mmol/L — ABNORMAL LOW (ref 135–145)
Total Bilirubin: 3.2 mg/dL — ABNORMAL HIGH (ref 0.3–1.2)
Total Protein: 8.1 g/dL (ref 6.5–8.1)

## 2022-04-16 LAB — CBC WITH DIFFERENTIAL/PLATELET
Abs Immature Granulocytes: 0.07 10*3/uL (ref 0.00–0.07)
Basophils Absolute: 0 10*3/uL (ref 0.0–0.1)
Basophils Relative: 1 %
Eosinophils Absolute: 1 10*3/uL — ABNORMAL HIGH (ref 0.0–0.5)
Eosinophils Relative: 19 %
HCT: 33.3 % — ABNORMAL LOW (ref 36.0–46.0)
Hemoglobin: 10.6 g/dL — ABNORMAL LOW (ref 12.0–15.0)
Immature Granulocytes: 1 %
Lymphocytes Relative: 22 %
Lymphs Abs: 1.2 10*3/uL (ref 0.7–4.0)
MCH: 30.5 pg (ref 26.0–34.0)
MCHC: 31.8 g/dL (ref 30.0–36.0)
MCV: 95.7 fL (ref 80.0–100.0)
Monocytes Absolute: 0.7 10*3/uL (ref 0.1–1.0)
Monocytes Relative: 12 %
Neutro Abs: 2.6 10*3/uL (ref 1.7–7.7)
Neutrophils Relative %: 45 %
Platelets: 207 10*3/uL (ref 150–400)
RBC: 3.48 MIL/uL — ABNORMAL LOW (ref 3.87–5.11)
RDW: 16 % — ABNORMAL HIGH (ref 11.5–15.5)
WBC: 5.6 10*3/uL (ref 4.0–10.5)
nRBC: 0 % (ref 0.0–0.2)

## 2022-04-16 LAB — PROTIME-INR
INR: 1.1 (ref 0.8–1.2)
Prothrombin Time: 13.8 seconds (ref 11.4–15.2)

## 2022-04-16 NOTE — ED Triage Notes (Signed)
Pt reports abd distention x3 months.  Denies pain, shob Hx liver cirrhosis and CHF with ef 45 to 50%.  Last tapped in June 2023.

## 2022-04-16 NOTE — Discharge Instructions (Addendum)
Our social work team will contact you tomorrow with a plan for follow-up within this week.  If you do not hear from them, please use the provided information above to follow-up in our primary care clinic.  Return here for concerning changes in your condition.

## 2022-04-16 NOTE — ED Provider Triage Note (Signed)
Emergency Medicine Provider Triage Evaluation Note  Hannah Blake , a 71 y.o. female  was evaluated in triage.  Pt complains of abdominal swelling for the last 3 days. Report hx of paracentesis and cirrhosis. Reports abdominal pain and increased gas w/eating and abdominal swelling. No insurance. No constipation, vomiting, nausea.  Review of Systems  Positive: Abdominal swelling Negative: Vomiting, nausea  Physical Exam  BP 129/78 (BP Location: Left Arm)   Pulse 85   Temp 98.2 F (36.8 C) (Oral)   Resp 16   Ht 4\' 11"  (1.499 m)   Wt 48.8 kg   SpO2 100%   BMI 21.73 kg/m  Gen:   Awake, no distress   Resp:  Normal effort  MSK:   Moves extremities without difficulty  Other:  +abdominal distension  Medical Decision Making  Medically screening exam initiated at 10:35 AM.  Appropriate orders placed.  Valere Dross was informed that the remainder of the evaluation will be completed by another provider, this initial triage assessment does not replace that evaluation, and the importance of remaining in the ED until their evaluation is complete.     Osvaldo Shipper, [place] 04/16/22 1049

## 2022-04-16 NOTE — ED Provider Notes (Signed)
[facility] DEPT Provider Note   CSN: [id number] Arrival date & time: 04/16/22  1015     History  Chief Complaint  Patient presents with   abd distention    [name] is a 71 y.o. female.  HPI Patient with a history of cirrhosis, unclear etiology, possibly due to prior alcohol use and cholangitis presents with family members who assist with history.  She notes that since paracentesis in June of this year she has had increasing abdominal distention and discomfort with associated anorexia, but no fever, no vomiting.  She has not followed up with physicians, either primary care or gastroenterology, notes that she is not taking any of her medication, attributes some of this difficulty to cost.    Home Medications Prior to Admission medications   Medication Sig Start Date End Date Taking? Authorizing Provider  furosemide (LASIX) 20 MG tablet Take 1 tablet (20 mg total) by mouth daily. 01/18/22 02/17/22  Antonieta Pert, MD  pantoprazole (PROTONIX) 40 MG tablet Take 1 tablet (40 mg total) by mouth 2 (two) times daily. 01/17/22 02/16/22  Antonieta Pert, MD  spironolactone (ALDACTONE) 50 MG tablet Take 1 tablet (50 mg total) by mouth daily. 01/18/22 02/17/22  Antonieta Pert, MD      Allergies    Patient has no known allergies.    Review of Systems   Review of Systems  All other systems reviewed and are negative.   Physical Exam Updated Vital Signs BP (!) 143/71   Pulse 75   Temp 98 F (36.7 C) (Oral)   Resp 14   Ht 4' 11"  (1.499 m)   Wt 48.8 kg   SpO2 100%   BMI 21.73 kg/m  Physical Exam Vitals and nursing note reviewed.  Constitutional:      General: She is not in acute distress.    Appearance: She is well-developed. She is not toxic-appearing.  HENT:     Head: Normocephalic and atraumatic.  Eyes:     Conjunctiva/sclera: Conjunctivae normal.  Cardiovascular:     Rate and Rhythm: Normal rate and regular rhythm.  Pulmonary:     Effort: Pulmonary effort is  normal. No respiratory distress.     Breath sounds: Normal breath sounds. No stridor.  Abdominal:     General: There is no distension.     Comments: Protuberant abdomen, without evidence for peritonitis  Skin:    General: Skin is warm and dry.  Neurological:     Mental Status: She is alert and oriented to person, place, and time.     Cranial Nerves: No cranial nerve deficit.  Psychiatric:        Mood and Affect: Mood normal.     ED Results / Procedures / Treatments   Labs (all labs ordered are listed, but only abnormal results are displayed) Labs Reviewed  COMPREHENSIVE METABOLIC PANEL - Abnormal; Notable for the following components:      Result Value   Sodium 134 (*)    CO2 21 (*)    Calcium 8.8 (*)    Albumin 2.9 (*)    AST 83 (*)    Alkaline Phosphatase 587 (*)    Total Bilirubin 3.2 (*)    All other components within normal limits  CBC WITH DIFFERENTIAL/PLATELET - Abnormal; Notable for the following components:   RBC 3.48 (*)    Hemoglobin 10.6 (*)    HCT 33.3 (*)    RDW 16.0 (*)    Eosinophils Absolute 1.0 (*)  All other components within normal limits  PROTIME-INR    EKG None  Radiology No results found.  Procedures .Paracentesis  Date/Time: 04/16/2022 6:00 PM  Performed by: Carmin Muskrat, MD Authorized by: Carmin Muskrat, MD   Consent:    Consent obtained:  Verbal   Consent given by:  Patient (children as well)   Risks, benefits, and alternatives were discussed: yes     Risks discussed:  Bowel perforation, pain and infection   Alternatives discussed:  Referral Universal protocol:    Procedure explained and questions answered to patient or proxy's satisfaction: yes     Relevant documents present and verified: yes     Required blood products, implants, devices, and special equipment available: yes     Site/side marked: yes     Immediately prior to procedure, a time out was called: yes     Patient identity confirmed:  Verbally with  patient Pre-procedure details:    Procedure purpose:  Therapeutic   Preparation: Patient was prepped and draped in usual sterile fashion   Anesthesia:    Anesthesia method:  Local infiltration   Local anesthetic:  Lidocaine 1% w/o epi Procedure details:    Needle gauge:  20   Ultrasound guidance: yes     Puncture site:  L lower quadrant   Fluid removed amount:  4.5L   Fluid appearance:  Yellow   Dressing:  Adhesive bandage Post-procedure details:    Procedure completion:  Tolerated well, no immediate complications     Medications Ordered in ED Medications - No data to display  ED Course/ Medical Decision Making/ A&P  This patient with a Hx of cirrhosis, heart failure, recurrent ascites presents to the ED for concern of abdominal distention, this involves an extensive number of treatment options, and is a complaint that carries with it a high risk of complications and morbidity.    The differential diagnosis includes recurrent ascites, less likely SBP, CHF, bacteremia, sepsis, though these are all considerations.   Social Determinants of Health:  Advanced age, prior palliative care involvement, primarily speaks [place]  Additional history obtained:  Additional history and/or information obtained from family members and chart review, notable for family for HPI, and per chart review the patient has been seen and evaluated as above multiple times, has been inconsistent with follow-up, and medication compliance.  Family notes that this is largely due to monetary difficulties.   After the initial evaluation, orders, including: Labs were initiated.   Patient placed on Cardiac and Pulse-Oximetry Monitors. The patient was maintained on a cardiac monitor.  The cardiac monitored showed an rhythm of 80 sinus normal The patient was also maintained on pulse oximetry. The readings were typically 99% room air normal   On repeat evaluation of the patient improved  Lab Tests:  I  personally interpreted labs.  The pertinent results include: Alk phos 500, hyperbilirubinemia trending upward since June    Consultations Obtained:  I requested consultation with the social work for assistance with follow-up,   they recommend: Follow-up with their services, for dissipated follow-up with our internal medicine clinic  Dispostion / Final MDM:  After consideration of the diagnostic results and the patient's response to treatment, this adult female with known cirrhosis, recurrent ascites presents with increasing abdominal distention, anorexia secondary to this.  No physical exam findings consistent with SBP, nor bacteremia or sepsis.  Labs consistent with progression of known disease, but no evidence for decompensation.  Case discussed at length with our social workers to assist  with follow-up and after the patient had successful paracentesis performed here, patient felt markedly better.  Patient discharged to follow-up closely as an outpatient, the hospitalization was a consideration given her history, and difficulties with navigating outpatient care.   Final Clinical Impression(s) / ED Diagnoses Final diagnoses:  Generalized abdominal pain  Cirrhosis of liver with ascites, unspecified hepatic cirrhosis type [facility])     Carmin Muskrat, MD 04/16/22 1946

## 2022-05-01 ENCOUNTER — Ambulatory Visit: Payer: Self-pay | Admitting: Student

## 2022-05-01 ENCOUNTER — Encounter: Payer: Self-pay | Admitting: Student

## 2022-05-01 ENCOUNTER — Other Ambulatory Visit (HOSPITAL_COMMUNITY): Payer: Self-pay

## 2022-05-01 DIAGNOSIS — D638 Anemia in other chronic diseases classified elsewhere: Secondary | ICD-10-CM

## 2022-05-01 DIAGNOSIS — K7460 Unspecified cirrhosis of liver: Secondary | ICD-10-CM

## 2022-05-01 DIAGNOSIS — I5020 Unspecified systolic (congestive) heart failure: Secondary | ICD-10-CM

## 2022-05-01 DIAGNOSIS — K259 Gastric ulcer, unspecified as acute or chronic, without hemorrhage or perforation: Secondary | ICD-10-CM

## 2022-05-01 DIAGNOSIS — K7030 Alcoholic cirrhosis of liver without ascites: Secondary | ICD-10-CM

## 2022-05-01 DIAGNOSIS — K7290 Hepatic failure, unspecified without coma: Secondary | ICD-10-CM

## 2022-05-01 DIAGNOSIS — K743 Primary biliary cirrhosis: Secondary | ICD-10-CM

## 2022-05-01 DIAGNOSIS — I8510 Secondary esophageal varices without bleeding: Secondary | ICD-10-CM

## 2022-05-01 DIAGNOSIS — K219 Gastro-esophageal reflux disease without esophagitis: Secondary | ICD-10-CM

## 2022-05-01 DIAGNOSIS — R188 Other ascites: Secondary | ICD-10-CM

## 2022-05-01 MED ORDER — PANTOPRAZOLE SODIUM 40 MG PO TBEC
40.0000 mg | DELAYED_RELEASE_TABLET | Freq: Every day | ORAL | 0 refills | Status: DC
  Filled 2022-05-01 (×2): qty 30, 30d supply, fill #0

## 2022-05-01 MED ORDER — SPIRONOLACTONE 50 MG PO TABS
50.0000 mg | ORAL_TABLET | Freq: Every day | ORAL | 0 refills | Status: DC
  Filled 2022-05-01: qty 30, 30d supply, fill #0

## 2022-05-01 MED ORDER — PANTOPRAZOLE SODIUM 40 MG PO TBEC
40.0000 mg | DELAYED_RELEASE_TABLET | Freq: Two times a day (BID) | ORAL | 0 refills | Status: DC
  Filled 2022-05-01: qty 60, 30d supply, fill #0

## 2022-05-01 MED ORDER — URSODIOL 250 MG PO TABS: 750.0000 mg | ORAL_TABLET | Freq: Two times a day (BID) | ORAL | 3 refills | Status: DC

## 2022-05-01 MED ORDER — FUROSEMIDE 20 MG PO TABS
20.0000 mg | ORAL_TABLET | Freq: Every day | ORAL | 0 refills | Status: DC
  Filled 2022-05-01: qty 30, 30d supply, fill #0

## 2022-05-01 MED ORDER — URSODIOL 300 MG PO CAPS: 600.0000 mg | ORAL_CAPSULE | Freq: Two times a day (BID) | ORAL | 3 refills | Status: DC

## 2022-05-01 NOTE — Progress Notes (Signed)
   CC: New patient with decompensated cirrhosis  HPI:  [name] is a 71 y.o. female with history as below who presents to establish care with a new diagnosis of decompensated alcoholic cirrhosis.  Please see encounters tab for problem based charting.  Impression interpreter and patient's son utilized for interpretation during this visit.  Past Medical History:  Diagnosis Date   Bell's palsy    Cirrhosis ([place])    Coronary artery disease    Heart failure with reduced ejection fraction (HCC)    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([place])    Family History  Family history unknown: Yes    Social History   Socioeconomic History   Marital status: Single    Spouse name: Not on file   Number of children: Not on file   Years of education: Not on file   Highest education level: Not on file  Occupational History   Not on file  Tobacco Use   Smoking status: Every Day    Packs/day: 1.00    Years: 40.00    Total pack years: 40.00    Types: Cigarettes   Smokeless tobacco: Never  Vaping Use   Vaping Use: Never used  Substance and Sexual Activity   Alcohol use: Not Currently    Comment: weekly   Drug use: Never   Sexual activity: Not on file  Other Topics Concern   Not on file  Social History Narrative   Not on file   Social Determinants of Health   Financial Resource Strain: Not on file  Food Insecurity: Not on file  Transportation Needs: Not on file  Physical Activity: Not on file  Stress: Not on file  Social Connections: Not on file  Intimate Partner Violence: Not on file    Review of Systems:   A comprehensive review of systems was negative except for: Ears, nose, mouth, throat, and face: positive for nasal congestion Gastrointestinal: positive for jaundice, reflux symptoms, and ascites   Physical Exam:  Vitals:   05/01/22 1558  BP: 115/67  Pulse: 91  Temp: 98.4 F (36.9 C)  TempSrc: Oral  SpO2: 100%  Weight: 105 lb 1.6 oz (47.7 kg)   Constitutional: Thin  elderly female sitting in wheelchair. In no acute distress. HENT: Normocephalic, atraumatic,  Eyes: Scleral icterus, PERRL, EOM intact Neck:no neck masses, no jvd Cardio:Regular rate and rhythm. No murmurs, rubs, or gallops. 2+ bilateral radial and dorsalis pedis  pulses. Pulm: Coarse breath sounds bilaterally. Normal work of breathing on room air. Abdomen: Slightly tense and distended abdomen consistent with ascites, nontender, positive bowel sounds. ESP:[id number] for extremity edema. Skin:Warm and dry.  Jaundice present. Neuro:Alert and oriented x3. No focal deficit noted. Psych:Pleasant mood and affect.   Assessment & Plan:   See Encounters Tab for problem based charting.  Patient seen with Dr. Evette Doffing

## 2022-05-01 NOTE — Patient Instructions (Signed)
Thank you, Hannah Blake, for allowing [place] to provide your care today. Today we discussed . . .  > Cirrhosis       -We are going to start back your medications which are listed below.  If you have any worsening swelling or other symptoms please do not hesitate to give our office a call and we may be able to perform procedure to drain the fluid or evaluate you to see what we need to do next.  Please continue to abstain from alcohol and eat a diet that is low in salt and moderate and protein. > GERD       -We will refill your pantoprazole which should help with the acid reflux symptoms you have been having.  We have changed the prescription so you will take this medication just once daily.  If you do not get any relief please give [place] a call and we can figure out what to do next.   I have ordered the following labs for you:  Lab Orders  No laboratory test(s) ordered today      Tests ordered today:  None   Referrals ordered today:   Referral Orders  No referral(s) requested today      I have ordered the following medication/changed the following medications:   Stop the following medications: Medications Discontinued During This Encounter  Medication Reason   pantoprazole (PROTONIX) 40 MG tablet Reorder   spironolactone (ALDACTONE) 50 MG tablet Reorder   furosemide (LASIX) 20 MG tablet Reorder   pantoprazole (PROTONIX) 40 MG tablet      Start the following medications: Meds ordered this encounter  Medications   furosemide (LASIX) 20 MG tablet    Sig: Take 1 tablet (20 mg total) by mouth daily.    Dispense:  30 tablet    Refill:  0    IM Program   DISCONTD: pantoprazole (PROTONIX) 40 MG tablet    Sig: Take 1 tablet (40 mg total) by mouth 2 (two) times daily.    Dispense:  60 tablet    Refill:  0    IM Program   spironolactone (ALDACTONE) 50 MG tablet    Sig: Take 1 tablet (50 mg total) by mouth daily.    Dispense:  30 tablet    Refill:  0    IM Program   ursodiol  (URSO) 250 MG tablet    Sig: Take 3 tablets (750 mg total) by mouth 2 (two) times daily.    Dispense:  180 tablet    Refill:  3   ursodiol (ACTIGALL) 300 MG capsule    Sig: Take 2 capsules (600 mg total) by mouth 2 (two) times daily.    Dispense:  120 capsule    Refill:  3   pantoprazole (PROTONIX) 40 MG tablet    Sig: Take 1 tablet (40 mg total) by mouth daily.    Dispense:  30 tablet    Refill:  0    IM Program      Follow up:  1 Month     Remember:     Should you have any questions or concerns please call the internal medicine clinic at [phone].     Johny Blamer, [facility]  C?m ?n c HOLLEE FATE ? cho php chng ti ch?m [place] cho b?n ngy hm nay. Hm nay chng ta ? th?o lu?n. . .  > X? gan        -Chng ti s? b?t ??  u s? d?ng l?i cc lo?i thu?c ???c li?t k d??i ?y c?a b?n. N?u b?n b? s?ng t?y n?ng h?n ho?c c cc tri?u ch?ng khc, vui lng g?i cho v?n phng c?a chng ti v chng ti c th? th?c hi?n quy trnh rt ch?t l?ng ho?c ?nh gi b?n ?? xem chng ti c?n lm g ti?p theo. Hy ti?p t?c king r??u v ?n m?t ch? ?? ?n t mu?i, v?a ph?i v protein. > b?nh tro ng??c d? dy th?c qu?n        -Chng ti s? b? sung thm pantoprazole ?? gip gi?m cc tri?u ch?ng tro ng??c axit m b?n ?ang g?p ph?i. Chng ti ? thay ??i ??n thu?c nn b?n s? ch? dng thu?c ny m?t l?n m?i ngy. N?u b?n khng nh?n ???c b?t k? s? tr? gip no, vui lng g?i cho chng ti v chng ti c th? tm ra nh?ng vi?c c?n lm ti?p theo.   Ti ? ??t hng cc phng th nghi?m sau ?y cho b?n:  ??n ??t hng phng th nghi?m Khng c (cc) xt nghi?m no ???c yu c?u hm nay     Cc bi ki?m tra ???c ??t hng ngy hm nay:  Khng c   Cc gi?i thi?u ???c ??t hng ngy hm nay:  L?nh gi?i thi?u Khng c (cc) l??t gi?i thi?u no ???c yu c?u hm nay     Ti ? ??t mua lo?i thu?c sau/? thay ??i nh?ng lo?i thu?c sau:  D?ng cc lo?i thu?c sau: Thu?c  ng?ng s? d?ng trong cu?c g?p g? ny L do dng thu?c  vin pantoprazole (PROTONIX) 40 MG ??t hng l?i  vin spironolactone (ALDACTONE) 50 MG ??t hng l?i  Vin furosemide (LASIX) 20 MG ??t hng l?i  vin pantoprazole (PROTONIX) 40 MG    B?t ??u dng cc lo?i thu?c sau: Meds ? ra l?nh cho cu?c g?p g? ny Thu?c  vin furosemide (LASIX) 20 MG Sig: U?ng 1 vin (t?ng c?ng 20 mg) m?i ngy. Pha ch?: 30 vin N?p ti?n: 0 Ch??ng trnh IM  NG?NG: vin pantoprazole (PROTONIX) 40 MG Sig: U?ng 1 vin (t?ng c?ng 40 mg) 2 (hai) l?n m?i ngy. Pha ch?: 60 vin N?p ti?n: 0 Ch??ng trnh IM  vin spironolactone (ALDACTONE) 50 MG Sig: U?ng 1 vin (t?ng c?ng 50 mg) m?i ngy. Pha ch?: 30 vin N?p ti?n: 0 Ch??ng trnh IM  vin ursodiol (URSO) 250 MG Sig: U?ng 3 vin (t?ng c?ng 750 mg) 2 (hai) l?n m?i ngy. Pha ch?: 180 vin N?p l?i: 3  Vin nang ursodiol (ACTIGALL) 300 MG Sig: U?ng 2 vin (t?ng c?ng 600 mg) 2 (hai) l?n m?i ngy. Quy cch: h?p 120 vin N?p l?i: 3  vin pantoprazole (PROTONIX) 40 MG Sig: U?ng 1 vin (t?ng c?ng 40 mg) m?i ngy. Pha ch?: 30 vin N?p ti?n: 0 Ch??ng trnh IM     Theo di: 1 thng   Nh?:   N?u b?n c b?t k? cu h?i ho?c th?c m?c no, vui lng g?i cho phng khm n?i khoa theo s? [phone].   Rocky Morel, DO [facility]

## 2022-05-02 ENCOUNTER — Encounter: Payer: Self-pay | Admitting: Student

## 2022-05-02 DIAGNOSIS — K219 Gastro-esophageal reflux disease without esophagitis: Secondary | ICD-10-CM | POA: Insufficient documentation

## 2022-05-02 DIAGNOSIS — K743 Primary biliary cirrhosis: Secondary | ICD-10-CM | POA: Insufficient documentation

## 2022-05-02 DIAGNOSIS — D638 Anemia in other chronic diseases classified elsewhere: Secondary | ICD-10-CM

## 2022-05-02 HISTORY — DX: Anemia in other chronic diseases classified elsewhere: D63.8

## 2022-05-02 NOTE — Progress Notes (Signed)
Internal Medicine Clinic Attending  I saw and evaluated the patient.  I personally confirmed the key portions of the history and exam documented by Dr. Nikki Dom and I reviewed pertinent patient test results.  The assessment, diagnosis, and plan were formulated together and I agree with the documentation in the resident's note.   Difficult situation of a 71 year old person with decompensated cirrhosis, moderate ascites on exam today. This is non-infectious, related to alcohol use and possible PBC. We talked about goals of palliative treatment for her symptoms. I am hopeful restarting diuretics will manage the ascites and prevent future need for paracentesis. Ursodiol for pruritus. Patient and son are in agreement with our goals. Can arrange for paracentesis in [facility] in the future if needed. No encephalopathy on exam today. Not a candidate for transplant given advanced age. 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 She has had no alcohol exposure in over one year. Close follow up to monitor response to diuretics, we started at half doses given her small size, may need to double dose at next visit.

## 2022-05-02 NOTE — Assessment & Plan Note (Signed)
Patient notes return of reflux symptoms including retrosternal burning pain associated with meals and lying flat.  She states this was much better when she was taking the pantoprazole but unfortunately had run out of it and did not have access to refills. - Resume pantoprazole 40 mg daily

## 2022-05-02 NOTE — Assessment & Plan Note (Signed)
As in the overview patient diagnosed with PBC in 12/2021 and started on ursodiol but had run out.  She is currently having pruritus.  Denies any issues while taking the ursodiol.  We will send in 2 prescriptions as the price of this medication varies wildly. - Restart ursodiol 600-750 mg twice daily - Clarify what dose they picked up at next visit

## 2022-05-02 NOTE — Assessment & Plan Note (Addendum)
" >>  ASSESSMENT AND PLAN FOR DECOMPENSATED HEPATIC CIRRHOSIS (HCC) WRITTEN ON 11/25/2023  7:21 AM BY FERNAND PROST, MD   >>ASSESSMENT AND PLAN FOR HEPATIC CIRRHOSIS (HCC) WRITTEN ON 05/02/2022  8:14 AM BY Rishawn Walck, DO  As in the overview patient has a recent diagnosis of decompensated alcoholic cirrhosis with recurrent ascites and esophageal varices (banded in 12/2021) not currently on medical therapy.  No history of hepatic encephalopathy or SBP, renal function stable with creatinine of 0.6 and no evidence of coagulopathy with INR 1.1.  Presents today with return of ascites but not yet as bad as they were when she had the paracentesis about 3 weeks ago.  She continues to have pruritus and jaundice but no evidence of hepatic encephalopathy or SBP.  Patient counseled on treatment expectations for this disease.  Also counseled on availability of paracentesis through our clinic if needed which she will need to call ahead for.  MELD Na score of 15 with a 6% estimated 61-month mortality.  We believe if she is able to get on consistent therapy and continue to abstain from alcohol she will see an improved quality of life from her recent state.  We will restart her medications and as she is below 50 kg we will start at the lower doses that she was previously on.  We will also hold off on beta-blocker therapy due to having low pressures in the ED when evaluated and not knowing how exactly she will respond to reinitiating torsemide and spironolactone . - Resume furosemide  20 mg daily and spironolactone  50 mg daily - May add beta-blocker if heart rate and pressure allow at next visit.   >>ASSESSMENT AND PLAN FOR ESOPHAGEAL VARICES IN ALCOHOLIC CIRRHOSIS (HCC) WRITTEN ON 05/02/2022  8:15 AM BY Chung Chagoya, DO  Patient will need repeat EGD in the near future to evaluate for rebanding of esophageal varices previously banded in 12/2021.  No reports of hemoptysis since the last EGD.  Patient not on a beta-blocker due  to blood pressure concerns and we will hold off on starting this for now as we are reintroducing spironolactone  and Lasix  after she had run out for a few months. -Furosemide  20 mg daily, spironolactone  50 mg daily - May have beta-blocker if blood pressure and heart rate allow at next visit - When able to get other joints coverage we will refer for repeat EGD   >>ASSESSMENT AND PLAN FOR PRIMARY BILIARY CHOLANGITIS (HCC) WRITTEN ON 05/02/2022  8:10 AM BY Drianna Chandran, DO  As in the overview patient diagnosed with PBC in 12/2021 and started on ursodiol  but had run out.  She is currently having pruritus.  Denies any issues while taking the ursodiol .  We will send in 2 prescriptions as the price of this medication varies wildly. - Restart ursodiol  600-750 mg twice daily - Clarify what dose they picked up at next visit "

## 2022-05-02 NOTE — Assessment & Plan Note (Addendum)
Patient will need repeat EGD in the near future to evaluate for rebanding of esophageal varices previously banded in 12/2021.  No reports of hemoptysis since the last EGD.  Patient not on a beta-blocker due to blood pressure concerns and we will hold off on starting this for now as we are reintroducing spironolactone and Lasix after she had run out for a few months. -Furosemide 20 mg daily, spironolactone 50 mg daily - May have beta-blocker if blood pressure and heart rate allow at next visit - When able to get other joints coverage we will refer for repeat EGD

## 2022-07-05 ENCOUNTER — Emergency Department (HOSPITAL_COMMUNITY): Payer: Medicare Other

## 2022-07-05 ENCOUNTER — Other Ambulatory Visit: Payer: Self-pay

## 2022-07-05 ENCOUNTER — Inpatient Hospital Stay (HOSPITAL_COMMUNITY)
Admission: EM | Admit: 2022-07-05 | Discharge: 2022-07-09 | DRG: 380 | Disposition: A | Discharge status: Home / Self Care | Payer: Medicare Other | Attending: Internal Medicine | Admitting: Internal Medicine

## 2022-07-05 DIAGNOSIS — K259 Gastric ulcer, unspecified as acute or chronic, without hemorrhage or perforation: Secondary | ICD-10-CM | POA: Diagnosis present

## 2022-07-05 DIAGNOSIS — I2510 Atherosclerotic heart disease of native coronary artery without angina pectoris: Secondary | ICD-10-CM | POA: Diagnosis present

## 2022-07-05 DIAGNOSIS — K743 Primary biliary cirrhosis: Secondary | ICD-10-CM | POA: Diagnosis present

## 2022-07-05 DIAGNOSIS — R64 Cachexia: Secondary | ICD-10-CM | POA: Diagnosis present

## 2022-07-05 DIAGNOSIS — I5032 Chronic diastolic (congestive) heart failure: Secondary | ICD-10-CM | POA: Diagnosis present

## 2022-07-05 DIAGNOSIS — Z23 Encounter for immunization: Secondary | ICD-10-CM | POA: Diagnosis present

## 2022-07-05 DIAGNOSIS — D62 Acute posthemorrhagic anemia: Secondary | ICD-10-CM | POA: Diagnosis present

## 2022-07-05 DIAGNOSIS — R188 Other ascites: Secondary | ICD-10-CM

## 2022-07-05 DIAGNOSIS — E872 Acidosis, unspecified: Secondary | ICD-10-CM | POA: Diagnosis present

## 2022-07-05 DIAGNOSIS — K7030 Alcoholic cirrhosis of liver without ascites: Secondary | ICD-10-CM | POA: Diagnosis present

## 2022-07-05 DIAGNOSIS — K219 Gastro-esophageal reflux disease without esophagitis: Secondary | ICD-10-CM | POA: Diagnosis present

## 2022-07-05 DIAGNOSIS — K269 Duodenal ulcer, unspecified as acute or chronic, without hemorrhage or perforation: Secondary | ICD-10-CM | POA: Diagnosis present

## 2022-07-05 DIAGNOSIS — K922 Gastrointestinal hemorrhage, unspecified: Secondary | ICD-10-CM | POA: Diagnosis present

## 2022-07-05 DIAGNOSIS — R791 Abnormal coagulation profile: Secondary | ICD-10-CM | POA: Diagnosis present

## 2022-07-05 DIAGNOSIS — K7460 Unspecified cirrhosis of liver: Secondary | ICD-10-CM | POA: Diagnosis present

## 2022-07-05 DIAGNOSIS — Z79899 Other long term (current) drug therapy: Secondary | ICD-10-CM | POA: Diagnosis not present

## 2022-07-05 DIAGNOSIS — Z91148 Patient's other noncompliance with medication regimen for other reason: Secondary | ICD-10-CM

## 2022-07-05 DIAGNOSIS — K2211 Ulcer of esophagus with bleeding: Secondary | ICD-10-CM | POA: Diagnosis present

## 2022-07-05 DIAGNOSIS — R578 Other shock: Secondary | ICD-10-CM | POA: Diagnosis present

## 2022-07-05 DIAGNOSIS — K920 Hematemesis: Principal | ICD-10-CM

## 2022-07-05 DIAGNOSIS — I8510 Secondary esophageal varices without bleeding: Secondary | ICD-10-CM | POA: Diagnosis present

## 2022-07-05 DIAGNOSIS — D500 Iron deficiency anemia secondary to blood loss (chronic): Secondary | ICD-10-CM | POA: Diagnosis present

## 2022-07-05 DIAGNOSIS — F17210 Nicotine dependence, cigarettes, uncomplicated: Secondary | ICD-10-CM | POA: Diagnosis present

## 2022-07-05 DIAGNOSIS — N179 Acute kidney failure, unspecified: Secondary | ICD-10-CM | POA: Diagnosis present

## 2022-07-05 DIAGNOSIS — I5022 Chronic systolic (congestive) heart failure: Secondary | ICD-10-CM | POA: Diagnosis present

## 2022-07-05 DIAGNOSIS — Z8719 Personal history of other diseases of the digestive system: Secondary | ICD-10-CM | POA: Diagnosis present

## 2022-07-05 DIAGNOSIS — R579 Shock, unspecified: Secondary | ICD-10-CM

## 2022-07-05 DIAGNOSIS — Z1152 Encounter for screening for COVID-19: Secondary | ICD-10-CM | POA: Diagnosis not present

## 2022-07-05 DIAGNOSIS — R34 Anuria and oliguria: Secondary | ICD-10-CM | POA: Diagnosis present

## 2022-07-05 DIAGNOSIS — K7290 Hepatic failure, unspecified without coma: Secondary | ICD-10-CM | POA: Diagnosis present

## 2022-07-05 DIAGNOSIS — Z6820 Body mass index (BMI) 20.0-20.9, adult: Secondary | ICD-10-CM

## 2022-07-05 HISTORY — DX: Personal history of other diseases of the digestive system: Z87.19

## 2022-07-05 LAB — COMPREHENSIVE METABOLIC PANEL
ALT: 44 U/L (ref 0–44)
AST: 117 U/L — ABNORMAL HIGH (ref 15–41)
Albumin: 1.8 g/dL — ABNORMAL LOW (ref 3.5–5.0)
Alkaline Phosphatase: 323 U/L — ABNORMAL HIGH (ref 38–126)
Anion gap: 14 (ref 5–15)
BUN: 39 mg/dL — ABNORMAL HIGH (ref 8–23)
CO2: 17 mmol/L — ABNORMAL LOW (ref 22–32)
Calcium: 8.4 mg/dL — ABNORMAL LOW (ref 8.9–10.3)
Chloride: 105 mmol/L (ref 98–111)
Creatinine, Ser: 1.15 mg/dL — ABNORMAL HIGH (ref 0.44–1.00)
GFR, Estimated: 51 mL/min — ABNORMAL LOW (ref >60)
Glucose, Bld: 142 mg/dL — ABNORMAL HIGH (ref 70–99)
Potassium: 4.7 mmol/L (ref 3.5–5.1)
Sodium: 136 mmol/L (ref 135–145)
Total Bilirubin: 2.5 mg/dL — ABNORMAL HIGH (ref 0.3–1.2)
Total Protein: 6.5 g/dL (ref 6.5–8.1)

## 2022-07-05 LAB — CBC
HCT: 16.7 % — ABNORMAL LOW (ref 36.0–46.0)
Hemoglobin: 5 g/dL — CL (ref 12.0–15.0)
MCH: 27 pg (ref 26.0–34.0)
MCHC: 29.9 g/dL — ABNORMAL LOW (ref 30.0–36.0)
MCV: 90.3 fL (ref 80.0–100.0)
Platelets: 310 10*3/uL (ref 150–400)
RBC: 1.85 MIL/uL — ABNORMAL LOW (ref 3.87–5.11)
RDW: 14.7 % (ref 11.5–15.5)
WBC: 18.8 10*3/uL — ABNORMAL HIGH (ref 4.0–10.5)
nRBC: 0 % (ref 0.0–0.2)

## 2022-07-05 LAB — PREPARE RBC (CROSSMATCH)

## 2022-07-05 LAB — LACTIC ACID, PLASMA: Lactic Acid, Venous: 7.4 mmol/L (ref 0.5–1.9)

## 2022-07-05 LAB — PROTIME-INR
INR: 1.5 — ABNORMAL HIGH (ref 0.8–1.2)
Prothrombin Time: 17.7 seconds — ABNORMAL HIGH (ref 11.4–15.2)

## 2022-07-05 MED ORDER — ONDANSETRON HCL 4 MG/2ML IJ SOLN
4.0000 mg | Freq: Once | INTRAMUSCULAR | Status: AC
  Administered 2022-07-05: 4 mg via INTRAVENOUS
  Filled 2022-07-05: qty 2

## 2022-07-05 MED ORDER — PANTOPRAZOLE 80MG IVPB - SIMPLE MED
80.0000 mg | Freq: Once | INTRAVENOUS | Status: AC
  Administered 2022-07-05: 80 mg via INTRAVENOUS
  Filled 2022-07-05: qty 80

## 2022-07-05 MED ORDER — VITAMIN K1 10 MG/ML IJ SOLN
10.0000 mg | Freq: Once | INTRAVENOUS | Status: AC
  Administered 2022-07-05: 10 mg via INTRAVENOUS
  Filled 2022-07-05: qty 1

## 2022-07-05 MED ORDER — LACTATED RINGERS IV BOLUS
1000.0000 mL | Freq: Once | INTRAVENOUS | Status: AC
  Administered 2022-07-05: 1000 mL via INTRAVENOUS

## 2022-07-05 MED ORDER — POLYETHYLENE GLYCOL 3350 17 G PO PACK: 17.0000 g | PACK | Freq: Every day | ORAL | Status: DC | PRN

## 2022-07-05 MED ORDER — PANTOPRAZOLE SODIUM 40 MG IV SOLR: 40.0000 mg | Freq: Once | INTRAVENOUS | Status: DC

## 2022-07-05 MED ORDER — SODIUM CHLORIDE 0.9% IV SOLUTION: Freq: Once | INTRAVENOUS | Status: DC

## 2022-07-05 MED ORDER — SODIUM CHLORIDE 0.9 % IV SOLN
50.0000 ug/h | INTRAVENOUS | Status: DC
  Administered 2022-07-05 – 2022-07-07 (×4): 50 ug/h via INTRAVENOUS
  Filled 2022-07-05 (×6): qty 1

## 2022-07-05 MED ORDER — SODIUM CHLORIDE 0.9 % IV SOLN
1.0000 g | Freq: Once | INTRAVENOUS | Status: AC
  Administered 2022-07-05: 1 g via INTRAVENOUS
  Filled 2022-07-05: qty 10

## 2022-07-05 MED ORDER — SODIUM CHLORIDE 0.9% IV SOLUTION: Freq: Once | INTRAVENOUS | Status: AC

## 2022-07-05 MED ORDER — SODIUM CHLORIDE 0.9 % IV SOLN
2.0000 g | Freq: Every day | INTRAVENOUS | Status: DC
  Administered 2022-07-06 – 2022-07-08 (×3): 2 g via INTRAVENOUS
  Filled 2022-07-05 (×3): qty 20

## 2022-07-05 MED ORDER — NOREPINEPHRINE 4 MG/250ML-% IV SOLN
5.0000 ug/min | INTRAVENOUS | Status: DC
  Administered 2022-07-05: 5 ug/min via INTRAVENOUS
  Filled 2022-07-05 (×2): qty 250

## 2022-07-05 MED ORDER — OCTREOTIDE LOAD VIA INFUSION
25.0000 ug | Freq: Once | INTRAVENOUS | Status: AC
  Administered 2022-07-05: 25 ug via INTRAVENOUS
  Filled 2022-07-05: qty 13

## 2022-07-05 MED ORDER — DOCUSATE SODIUM 100 MG PO CAPS: 100.0000 mg | ORAL_CAPSULE | Freq: Two times a day (BID) | ORAL | Status: DC | PRN

## 2022-07-05 MED ORDER — SODIUM CHLORIDE 0.9 % IV BOLUS
1000.0000 mL | Freq: Once | INTRAVENOUS | Status: AC
  Administered 2022-07-05: 1000 mL via INTRAVENOUS

## 2022-07-05 NOTE — H&P (Signed)
NAME:  CLAYTON JARMON, MRN:  [id number], DOB:  Oct 13, 1950, LOS: 0 ADMISSION DATE:  07/05/2022, CONSULTATION DATE:  07/05/22 REFERRING MD:  [facility] ER CHIEF COMPLAINT:  GI Bleed   History of Present Illness:  Hannah Blake is a 71 year old woman with cirrhosis, esophageal varices s/p banding 01/2022, coronary artery disease and HFpEF who presented to [facility] after vomiting blood two times. She continued to vomit two more times since being in the ED of dark blood. She has been in her normal state per grandson at the bedside until today. She complains of dizziness and feeling cold.   In the ER, hemoglobin is 5g/dL, Cr 1.15 (baseline 0.6), serum bicarb 17, Alk phos 323, albumin 1.8, T. Bili 2.5, Lactic Acid 7.4 and WBC count of 18.8. INR 1.5.  PCCM called to admit for GI bleed and shock. GI has been consulted with current plan for EGD tomorrow morning.   Pertinent  Medical History   Past Medical History:  Diagnosis Date   Bell's palsy    Cirrhosis ([place])    Coronary artery disease    Heart failure with reduced ejection fraction ([place])    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([place])    Significant Hospital Events: Including procedures, antibiotic start and stop dates in addition to other pertinent events   12/16 - Admitted to ICU for hematemesis  Interim History / Subjective:  As above  Objective   Blood pressure (!) 88/51, pulse 86, temperature (!) 94.3 F (34.6 C), temperature source Rectal, resp. rate 14, height _0  (1.499 m), weight 47 kg, SpO2 100 %.       No intake or output data in the 24 hours ending 07/05/22 2220 Filed Weights   07/05/22 2013  Weight: 47 kg   Examination: General: Mild distress, shivering, thin woman HENT: [place]/AT, sclera anicteric, moist mucous membranes Lungs: clear to auscultation, no wheezing Cardiovascular: rrr, no murmurs Abdomen: soft, non-tender, non-distended Extremities: warm, no edema Neuro: alert, oriented, moving all extremities GU: n/a  Resolved Hospital Problem  list     Assessment & Plan:  Shock - hemorrhagic due to GI bleeding - start peripheral levophed - 2 units PRBCs ordered - Received 3L of fluids, a fourth liter has been ordered - trend lactic acid levels  Acute GI Bleeding Liver Cirrhosis Esophageal Varices Coagulopathy - GI has been consulted, plan for EGD tomorrow AM - IV PPI drip - IV octreotide drip - transfusing as above - IV Vitamin K for elevated INR - Ceftriaxone 2g daily IV for 5 days  Acute Blood Loss Anemia - Transfusing as above - goal hemoglobin 7g/dL or greater - check CBC post transfusion then q4 hours  Acute Kidney Injury Lactic Acidosis Metabolic Acidosis - volume resuscitation as above - trend lactic acid - monitor Cr  HFpEF CAD - will monitor for volume overload given need for aggressive volume resuscitation  Best Practice (right click and "Reselect all SmartList Selections" daily)   Diet/type: NPO DVT prophylaxis: not indicated GI prophylaxis: PPI Lines: N/A Foley:  N/A Code Status:  full code Last date of multidisciplinary goals of care discussion [12/16]  Labs   CBC: Recent Labs  Lab 07/05/22 2056  WBC 18.8*  HGB 5.0*  HCT 16.7*  MCV 90.3  PLT 321    Basic Metabolic Panel: Recent Labs  Lab 07/05/22 2056  NA 136  K 4.7  CL 105  CO2 17*  GLUCOSE 142*  BUN 39*  CREATININE 1.15*  CALCIUM 8.4*   GFR: Estimated  Creatinine Clearance: 30.2 mL/min (A) (by C-G formula based on SCr of 1.15 mg/dL (H)). Recent Labs  Lab 07/05/22 2056  WBC 18.8*    Liver Function Tests: Recent Labs  Lab 07/05/22 2056  AST 117*  ALT 44  ALKPHOS 323*  BILITOT 2.5*  PROT 6.5  ALBUMIN 1.8*   No results for input(s): "LIPASE", "AMYLASE" in the last 168 hours. No results for input(s): "AMMONIA" in the last 168 hours.  ABG    Component Value Date/Time   HCO3 22.5 01/12/2022 2050   ACIDBASEDEF 2.5 (H) 01/12/2022 2050   O2SAT 49.2 01/12/2022 2050     Coagulation Profile: Recent  Labs  Lab 07/05/22 2129  INR 1.5*    Cardiac Enzymes: No results for input(s): "CKTOTAL", "CKMB", "CKMBINDEX", "TROPONINI" in the last 168 hours.  HbA1C: No results found for: "HGBA1C"  CBG: No results for input(s): "GLUCAP" in the last 168 hours.  Review of Systems:   Review of Systems  Constitutional:  Positive for chills. Negative for fever, malaise/fatigue and weight loss.  HENT:  Negative for congestion, sinus pain and sore throat.   Eyes: Negative.   Respiratory:  Negative for cough, hemoptysis, sputum production, shortness of breath and wheezing.   Cardiovascular:  Negative for chest pain, palpitations, orthopnea, claudication and leg swelling.  Gastrointestinal:  Positive for vomiting (blood). Negative for abdominal pain, heartburn and nausea.  Genitourinary: Negative.   Musculoskeletal:  Negative for joint pain and myalgias.  Skin:  Negative for rash.  Neurological:  Positive for dizziness. Negative for weakness.  Endo/Heme/Allergies: Negative.   Psychiatric/Behavioral: Negative.     Past Medical History:  She,  has a past medical history of Bell's palsy, Cirrhosis ([place]), Coronary artery disease, Heart failure with reduced ejection fraction ([place]), and 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([place]).   Surgical History:   Past Surgical History:  Procedure Laterality Date   BIOPSY  01/14/2022   Procedure: BIOPSY;  Surgeon: Otis Brace, MD;  Location: [facility] ENDOSCOPY;  Service: Gastroenterology;;   CLAVICLE SURGERY     ESOPHAGEAL BANDING  01/14/2022   Procedure: ESOPHAGEAL BANDING;  Surgeon: Otis Brace, MD;  Location: [facility] ENDOSCOPY;  Service: Gastroenterology;;   ESOPHAGOGASTRODUODENOSCOPY N/A 01/14/2022   Procedure: ESOPHAGOGASTRODUODENOSCOPY (EGD);  Surgeon: Otis Brace, MD;  Location: [facility];  Service: Gastroenterology;  Laterality: N/A;   FRACTURE SURGERY       Social History:   reports that she has been smoking cigarettes. She has a 40.00 pack-year  smoking history. She has never used smokeless tobacco. She reports that she does not currently use alcohol. She reports that she does not use drugs.   Family History:  Her Family history is unknown by patient.   Allergies No Known Allergies   Home Medications  Prior to Admission medications   Medication Sig Start Date End Date Taking? Authorizing Provider  furosemide (LASIX) 20 MG tablet Take 1 tablet (20 mg total) by mouth daily. 05/01/22 07/05/22  Johny Blamer, DO  pantoprazole (PROTONIX) 40 MG tablet Take 1 tablet (40 mg total) by mouth daily. 05/01/22 07/05/22  Johny Blamer, DO  spironolactone (ALDACTONE) 50 MG tablet Take 1 tablet (50 mg total) by mouth daily. 05/01/22 07/05/22  Johny Blamer, DO  ursodiol (ACTIGALL) 300 MG capsule Take 2 capsules (600 mg total) by mouth 2 (two) times daily. 05/01/22   Johny Blamer, DO  ursodiol (URSO) 250 MG tablet Take 3 tablets (750 mg total) by mouth 2 (two) times daily. 05/01/22   Johny Blamer, DO  Critical care time: 75 minutes    Freda Jackson, MD [facility] Office: [phone]   See Amion for personal pager PCCM on call pager [phone] until 7pm. Please call Elink 7p-7a. [phone]

## 2022-07-05 NOTE — ED Provider Notes (Signed)
[facility] DEPT Provider Note   CSN: [id number] Arrival date & time: 07/05/22  1740     History  Chief Complaint  Patient presents with   Hematemesis    Hannah Blake is a 71 y.o. female.  With past medical history of HFrEF, primary biliary cholangitis, CAD, alcoholic cirrhosis with esophageal varices who presents to the emergency department with hematemesis.  Level 5 caveat: Critical illness, waxing and waning mental status.  Hannah Blake is at the bedside.  He states that today she began having episodes of bloody emesis.  He states that she had 3 episodes prior to presenting to the emergency department and when she was here she had a fourth episode.  Denies having abdominal pain, dark or melanotic stools, recent fevers.  She is endorsing feeling lightheaded and dizzy.  On chart review, it appears that she had esophageal banding done in July by Dr. Alessandra Bevels.  Additionally she had multiple bleeding ulcers.   HPI     Home Medications Prior to Admission medications   Medication Sig Start Date End Date Taking? Authorizing Provider  furosemide (LASIX) 20 MG tablet Take 1 tablet (20 mg total) by mouth daily. 05/01/22 07/05/22  Johny Blamer, DO  pantoprazole (PROTONIX) 40 MG tablet Take 1 tablet (40 mg total) by mouth daily. 05/01/22 07/05/22  Johny Blamer, DO  spironolactone (ALDACTONE) 50 MG tablet Take 1 tablet (50 mg total) by mouth daily. 05/01/22 07/05/22  Johny Blamer, DO  ursodiol (ACTIGALL) 300 MG capsule Take 2 capsules (600 mg total) by mouth 2 (two) times daily. 05/01/22   Johny Blamer, DO  ursodiol (URSO) 250 MG tablet Take 3 tablets (750 mg total) by mouth 2 (two) times daily. 05/01/22   Johny Blamer, DO      Allergies    Patient has no known allergies.    Review of Systems   Review of Systems  Unable to perform ROS: Acuity of condition    Physical Exam Updated Vital Signs BP (!) 88/51   Pulse 86   Temp (!) 94.3 F  (34.6 C) (Rectal)   Resp 14   Ht 4\' 11"  (1.499 m)   Wt 47 kg   SpO2 100%   BMI 20.93 kg/m  Physical Exam Vitals and nursing note reviewed.  Constitutional:      General: She is in acute distress.     Appearance: She is ill-appearing.  HENT:     Head: Normocephalic.     Mouth/Throat:     Mouth: Mucous membranes are dry.  Eyes:     General: Scleral icterus present.     Extraocular Movements: Extraocular movements intact.  Cardiovascular:     Rate and Rhythm: Regular rhythm. Tachycardia present.  Pulmonary:     Effort: Pulmonary effort is normal. No respiratory distress.  Abdominal:     General: Bowel sounds are normal. There is no distension.     Palpations: Abdomen is soft.     Tenderness: There is no abdominal tenderness.  Skin:    General: Skin is warm and dry.     Capillary Refill: Capillary refill takes 2 to 3 seconds.     Coloration: Skin is jaundiced and pale.  Neurological:     General: No focal deficit present.     Mental Status: She is lethargic.     Motor: Weakness present.  Psychiatric:        Speech: Speech is delayed.        Behavior: Behavior is slowed. Behavior is  cooperative.     ED Results / Procedures / Treatments   Labs (all labs ordered are listed, but only abnormal results are displayed) Labs Reviewed  COMPREHENSIVE METABOLIC PANEL - Abnormal; Notable for the following components:      Result Value   CO2 17 (*)    Glucose, Bld 142 (*)    BUN 39 (*)    Creatinine, Ser 1.15 (*)    Calcium 8.4 (*)    Albumin 1.8 (*)    AST 117 (*)    Alkaline Phosphatase 323 (*)    Total Bilirubin 2.5 (*)    GFR, Estimated 51 (*)    All other components within normal limits  CBC - Abnormal; Notable for the following components:   WBC 18.8 (*)    RBC 1.85 (*)    Hemoglobin 5.0 (*)    HCT 16.7 (*)    MCHC 29.9 (*)    All other components within normal limits  LACTIC ACID, PLASMA - Abnormal; Notable for the following components:   Lactic Acid, Venous  7.4 (*)    All other components within normal limits  PROTIME-INR - Abnormal; Notable for the following components:   Prothrombin Time 17.7 (*)    INR 1.5 (*)    All other components within normal limits  CULTURE, BLOOD (ROUTINE X 2)  CULTURE, BLOOD (ROUTINE X 2)  RESP PANEL BY RT-PCR (RSV, FLU A&B, COVID)  RVPGX2  LACTIC ACID, PLASMA  URINALYSIS, ROUTINE W REFLEX MICROSCOPIC  POC OCCULT BLOOD, ED  TYPE AND SCREEN  PREPARE RBC (CROSSMATCH)  PREPARE RBC (CROSSMATCH)    EKG None  Radiology DG Chest Port 1 View  Result Date: 07/05/2022 CLINICAL DATA:  Unstable GI bleed EXAM: PORTABLE CHEST 1 VIEW COMPARISON:  01/12/2022 FINDINGS: Lungs are essentially clear.  No pleural effusion or pneumothorax. The heart is normal in size. IMPRESSION: No evidence of acute cardiopulmonary disease. Electronically Signed   By: Julian Hy M.D.   On: 07/05/2022 21:24    Procedures .Critical Care  Performed by: Mickie Hillier, PA-C Authorized by: Mickie Hillier, PA-C   Critical care provider statement:    Critical care time (minutes):  45   Critical care time was exclusive of:  Separately billable procedures and treating other patients   Critical care was necessary to treat or prevent imminent or life-threatening deterioration of the following conditions:  Shock, hepatic failure and circulatory failure   Critical care was time spent personally by me on the following activities:  Development of treatment plan with patient or surrogate, discussions with consultants, discussions with primary provider, evaluation of patient's response to treatment, examination of patient, interpretation of cardiac output measurements, obtaining history from patient or surrogate, review of old charts, re-evaluation of patient's condition, pulse oximetry and ordering and review of radiographic studies   I assumed direction of critical care for this patient from another provider in my specialty: no     Care discussed  with: admitting provider      Medications Ordered in ED Medications  octreotide (SANDOSTATIN) 2 mcg/mL load via infusion 25 mcg (25 mcg Intravenous Bolus from Bag 07/05/22 2228)    And  octreotide (SANDOSTATIN) 500 mcg in sodium chloride 0.9 % 250 mL (2 mcg/mL) infusion (50 mcg/hr Intravenous New Bag/Given 07/05/22 2222)  0.9 %  sodium chloride infusion (Manually program via Guardrails IV Fluids) (has no administration in time range)  0.9 %  sodium chloride infusion (Manually program via Guardrails IV Fluids) (has no administration  in time range)  norepinephrine (LEVOPHED) 4mg  in (0.016 mg/mL) premix infusion (has no administration in time range)  phytonadione (VITAMIN K) 10 mg in dextrose 5 % 50 mL IVPB (has no administration in time range)  lactated ringers bolus 1,000 mL (1,000 mLs Intravenous New Bag/Given 07/05/22 2115)  sodium chloride 0.9 % bolus 1,000 mL (1,000 mLs Intravenous New Bag/Given 07/05/22 2115)  cefTRIAXone (ROCEPHIN) 1 g in sodium chloride 0.9 % 100 mL IVPB (1 g Intravenous New Bag/Given 07/05/22 2204)  pantoprazole (PROTONIX) 80 mg /NS 100 mL IVPB (80 mg Intravenous New Bag/Given 07/05/22 2201)  lactated ringers bolus 1,000 mL (1,000 mLs Intravenous Bolus 07/05/22 2145)  ondansetron (ZOFRAN) injection 4 mg (4 mg Intravenous Given 07/05/22 2150)    ED Course/ Medical Decision Making/ A&P Clinical Course as of 07/05/22 2240  Sat Jul 05, 2022  2132 Hgb 5 - she is getting emergent release blood until T/S crossmatch can be completed. Critical care has been paged.  [LA]  2147 Spoke with Dr. 2148, GI. Wants NPO and to inform him with further episodes of hematemesis  [LA]    Clinical Course User Index [LA] Ewing Schlein, PA-C                           Medical Decision Making Amount and/or Complexity of Data Reviewed Labs: ordered. Radiology: ordered.  Risk Prescription drug management. Decision regarding hospitalization.  Initial Impression and Ddx On  initial impression the patient is very ill-appearing.  She is jaundiced, lethargic appearing.  Her blood pressure 60s systolic.  I immediately ordered IV fluid bolus, we obtained to peripheral IVs, type and screen was ordered.  Presumed GI bleed so ordering IV octreotide and Protonix. Patient PMH that increases complexity of ED encounter: Cirrhosis, primary biliary cholangitis  Interpretation of Diagnostics I independent reviewed and interpreted the labs as followed: hemoglobin of 5, lactic of 7.4, wbc 18, see above for remainder   - I independently visualized the following imaging with scope of interpretation limited to determining acute life threatening conditions related to emergency care: not indicated   Patient Reassessment and Ultimate Disposition/Management I stayed in the room long enough for her to receive the first pressure bag of IV fluids.  Her pressure initially increased to 80 systolic and then dropped back down again.  She was ordered a second liter of IV fluids.  Informed by nursing that her hemoglobin is 5.  We have ordered emergent release blood x 1 unit while we are waiting on the type and screen/crossmatch to be completed.  I have contacted critical care and GI and awaiting their response..  We are also giving her third liter of IV fluid as her blood pressure is back down to 60 systolic.  Interval update.  The patient has a first unit of blood running at this time.  She received IV Protonix and octreotide and Rocephin.  She received 3 L of IV fluid.  I spoke with Dr. Cristopher Peru with GI who will see the patient for likely endoscopy in the morning.  I have also spoken with Dr. Ewing Schlein, critical care who will come admit the patient.  I was then called again to the bedside with blood pressure of 60 systolic again.  We started nor epi at 5.  Additionally her lactic is 7.4.  She will receive a second unit of blood.  I updated the family at bedside.  Admitted to critical care.  Think that this  is likely variceal GI bleed.  She had another episode of hematemesis while I was updating the family.  Doubt that she is in shock from other source at this time.  Will continue resuscitation until she is moved to critical care unit.  Patient management required discussion with the following services or consulting groups:  Intensivist Service and Gastroenterology  Complexity of Problems Addressed Acute illness or injury that poses threat of life of bodily function  Additional Data Reviewed and Analyzed Further history obtained from: Further history from spouse/family member, Past medical history and medications listed in the EMR, Recent Consult notes, Care Everywhere, and Prior labs/imaging results  Patient Encounter Risk Assessment SDOH impact on management, Consideration of hospitalization, and Major procedures  Final Clinical Impression(s) / ED Diagnoses Final diagnoses:  Hematemesis with nausea    Rx / DC Orders ED Discharge Orders     None         Mickie Hillier, PA-C 07/05/22 2240    Valarie Merino, MD 07/05/22 2332

## 2022-07-05 NOTE — ED Triage Notes (Signed)
States that she has been throwing up blood since earlier this afternoon

## 2022-07-05 NOTE — ED Provider Triage Note (Signed)
Emergency Medicine Provider Triage Evaluation Note  KASHARA BLOCHER , a 71 y.o. female  was evaluated in triage.  Pt complains of vomiting starting earlier this afternoon. Family member with her reports she recently had "issues with her gallbladder", no surgery.   Review of Systems  Positive:  Negative:   Physical Exam  BP (!) 70/43 (BP Location: Right Arm)   Pulse (!) 114   Resp 18   Ht 4\' 11"  (1.499 m)   Wt 47 kg   SpO2 100%   BMI 20.93 kg/m  Gen:   Awake, no distress   Resp:  Normal effort  MSK:   Moves extremities without difficulty  Other:  Appears acutely ill  Medical Decision Making  Medically screening exam initiated at 8:15 PM.  Appropriate orders placed.  was informed that the remainder of the evaluation will be completed by another provider, this initial triage assessment does not replace that evaluation, and the importance of remaining in the ED until their evaluation is complete.  Heme positive emesis checked with hemoccult card   Wardell Honour, PA-C 07/05/22 2016

## 2022-07-06 ENCOUNTER — Inpatient Hospital Stay (HOSPITAL_COMMUNITY): Payer: Medicare Other | Admitting: Certified Registered Nurse Anesthetist

## 2022-07-06 ENCOUNTER — Encounter (HOSPITAL_COMMUNITY)
Admission: EM | Disposition: A | Discharge status: Home / Self Care | Payer: Self-pay | Source: Home / Self Care | Attending: Internal Medicine

## 2022-07-06 ENCOUNTER — Encounter (HOSPITAL_COMMUNITY): Payer: Self-pay | Admitting: Pulmonary Disease

## 2022-07-06 DIAGNOSIS — K920 Hematemesis: Secondary | ICD-10-CM

## 2022-07-06 DIAGNOSIS — R578 Other shock: Secondary | ICD-10-CM

## 2022-07-06 DIAGNOSIS — I509 Heart failure, unspecified: Secondary | ICD-10-CM

## 2022-07-06 DIAGNOSIS — K2211 Ulcer of esophagus with bleeding: Secondary | ICD-10-CM

## 2022-07-06 DIAGNOSIS — K279 Peptic ulcer, site unspecified, unspecified as acute or chronic, without hemorrhage or perforation: Secondary | ICD-10-CM

## 2022-07-06 DIAGNOSIS — N179 Acute kidney failure, unspecified: Secondary | ICD-10-CM

## 2022-07-06 DIAGNOSIS — F17210 Nicotine dependence, cigarettes, uncomplicated: Secondary | ICD-10-CM

## 2022-07-06 HISTORY — PX: BIOPSY: SHX5522

## 2022-07-06 HISTORY — PX: ESOPHAGOGASTRODUODENOSCOPY (EGD) WITH PROPOFOL: SHX5813

## 2022-07-06 LAB — CBC
HCT: 21.8 % — ABNORMAL LOW (ref 36.0–46.0)
HCT: 21.8 % — ABNORMAL LOW (ref 36.0–46.0)
Hemoglobin: 6.9 g/dL — CL (ref 12.0–15.0)
Hemoglobin: 7.2 g/dL — ABNORMAL LOW (ref 12.0–15.0)
MCH: 26.6 pg (ref 26.0–34.0)
MCH: 27.9 pg (ref 26.0–34.0)
MCHC: 31.7 g/dL (ref 30.0–36.0)
MCHC: 33 g/dL (ref 30.0–36.0)
MCV: 84.2 fL (ref 80.0–100.0)
MCV: 84.5 fL (ref 80.0–100.0)
Platelets: 205 10*3/uL (ref 150–400)
Platelets: 135 10*3/uL — ABNORMAL LOW (ref 150–400)
RBC: 2.59 MIL/uL — ABNORMAL LOW (ref 3.87–5.11)
RBC: 2.58 MIL/uL — ABNORMAL LOW (ref 3.87–5.11)
RDW: 16.8 % — ABNORMAL HIGH (ref 11.5–15.5)
RDW: 16.9 % — ABNORMAL HIGH (ref 11.5–15.5)
WBC: 24.5 10*3/uL — ABNORMAL HIGH (ref 4.0–10.5)
WBC: 9.2 10*3/uL (ref 4.0–10.5)
nRBC: 0.1 % (ref 0.0–0.2)
nRBC: 0.3 % — ABNORMAL HIGH (ref 0.0–0.2)

## 2022-07-06 LAB — URINALYSIS, ROUTINE W REFLEX MICROSCOPIC
Bacteria, UA: NONE SEEN
Bilirubin Urine: NEGATIVE
Glucose, UA: NEGATIVE mg/dL
Hgb urine dipstick: NEGATIVE
Ketones, ur: NEGATIVE mg/dL
Leukocytes,Ua: NEGATIVE
Nitrite: NEGATIVE
Protein, ur: 30 mg/dL — AB
Specific Gravity, Urine: 1.014 (ref 1.005–1.030)
pH: 5 (ref 5.0–8.0)

## 2022-07-06 LAB — LACTIC ACID, PLASMA: Lactic Acid, Venous: 3.1 mmol/L (ref 0.5–1.9)

## 2022-07-06 LAB — HEMOGLOBIN AND HEMATOCRIT, BLOOD
HCT: 25.3 % — ABNORMAL LOW (ref 36.0–46.0)
Hemoglobin: 8.4 g/dL — ABNORMAL LOW (ref 12.0–15.0)

## 2022-07-06 LAB — RESP PANEL BY RT-PCR (RSV, FLU A&B, COVID)  RVPGX2
Influenza A by PCR: NEGATIVE
Influenza B by PCR: NEGATIVE
Resp Syncytial Virus by PCR: NEGATIVE
SARS Coronavirus 2 by RT PCR: NEGATIVE

## 2022-07-06 LAB — PREPARE RBC (CROSSMATCH)

## 2022-07-06 LAB — MRSA NEXT GEN BY PCR, NASAL: MRSA by PCR Next Gen: NOT DETECTED

## 2022-07-06 LAB — MAGNESIUM: Magnesium: 1.9 mg/dL (ref 1.7–2.4)

## 2022-07-06 LAB — PHOSPHORUS: Phosphorus: 5.6 mg/dL — ABNORMAL HIGH (ref 2.5–4.6)

## 2022-07-06 SURGERY — ESOPHAGOGASTRODUODENOSCOPY (EGD) WITH PROPOFOL
Anesthesia: Monitor Anesthesia Care

## 2022-07-06 MED ORDER — SODIUM CHLORIDE 0.9% IV SOLUTION: Freq: Once | INTRAVENOUS | Status: AC

## 2022-07-06 MED ORDER — LACTATED RINGERS IV SOLN: INTRAVENOUS | Status: DC

## 2022-07-06 MED ORDER — ALBUMIN HUMAN 5 % IV SOLN: INTRAVENOUS | Status: DC | PRN

## 2022-07-06 MED ORDER — SODIUM CHLORIDE 0.9 % IV SOLN: INTRAVENOUS | Status: DC

## 2022-07-06 MED ORDER — LIDOCAINE HCL (CARDIAC) PF 100 MG/5ML IV SOSY
PREFILLED_SYRINGE | INTRAVENOUS | Status: DC | PRN
  Administered 2022-07-06: 50 mg via INTRAVENOUS

## 2022-07-06 MED ORDER — PANTOPRAZOLE SODIUM 40 MG IV SOLR: 40.0000 mg | Freq: Two times a day (BID) | INTRAVENOUS | Status: DC

## 2022-07-06 MED ORDER — CHLORHEXIDINE GLUCONATE CLOTH 2 % EX PADS
6.0000 | MEDICATED_PAD | Freq: Every day | CUTANEOUS | Status: DC
  Administered 2022-07-06 – 2022-07-07 (×2): 6 via TOPICAL

## 2022-07-06 MED ORDER — ONDANSETRON HCL 4 MG/2ML IJ SOLN
4.0000 mg | Freq: Four times a day (QID) | INTRAMUSCULAR | Status: DC | PRN
  Administered 2022-07-06: 4 mg via INTRAVENOUS
  Filled 2022-07-06: qty 2

## 2022-07-06 MED ORDER — ALBUMIN HUMAN 5 % IV SOLN
INTRAVENOUS | Status: AC
  Filled 2022-07-06: qty 250

## 2022-07-06 MED ORDER — NOREPINEPHRINE 4 MG/250ML-% IV SOLN
2.0000 ug/min | INTRAVENOUS | Status: DC
  Administered 2022-07-06: 10 ug/min via INTRAVENOUS
  Administered 2022-07-06: 6 ug/min via INTRAVENOUS
  Filled 2022-07-06: qty 250

## 2022-07-06 MED ORDER — LACTATED RINGERS IV SOLN: INTRAVENOUS | Status: AC

## 2022-07-06 MED ORDER — PROPOFOL 10 MG/ML IV BOLUS
INTRAVENOUS | Status: DC | PRN
  Administered 2022-07-06 (×3): 20 mg via INTRAVENOUS

## 2022-07-06 MED ORDER — PROPOFOL 500 MG/50ML IV EMUL
INTRAVENOUS | Status: DC | PRN
  Administered 2022-07-06: 80 ug/kg/min via INTRAVENOUS

## 2022-07-06 MED ORDER — SODIUM CHLORIDE 0.9 % IV SOLN
250.0000 mL | INTRAVENOUS | Status: DC
  Administered 2022-07-09: 250 mL via INTRAVENOUS

## 2022-07-06 MED ORDER — PANTOPRAZOLE INFUSION (NEW) - SIMPLE MED
8.0000 mg/h | INTRAVENOUS | Status: DC
  Administered 2022-07-06 – 2022-07-07 (×4): 8 mg/h via INTRAVENOUS
  Filled 2022-07-06 (×4): qty 80

## 2022-07-06 SURGICAL SUPPLY — 15 items

## 2022-07-06 NOTE — Brief Op Note (Signed)
07/05/2022 - 07/06/2022  10:57 AM  PATIENT:  Hannah Blake  71 y.o. female  PRE-OPERATIVE DIAGNOSIS:  GI bleed, Cirrhosis  POST-OPERATIVE DIAGNOSIS:  esophageal and gastric ulcers, gastric ulcer biopsies  PROCEDURE:  Procedure(s): ESOPHAGOGASTRODUODENOSCOPY (EGD) WITH PROPOFOL (N/A) BIOPSY  SURGEON:  Surgeon(s) and Role:    * Clois Montavon, MD - Primary  PHYSICIAN ASSISTANT:   Findings ------------ -EGD showed distal esophageal ulcer, 1 large gastric ulcer on incisura as well as multiple gastric ulcers with large amount of blood clots in the stomach.  No gastric varices.  Very small esophageal varices.  Bleeding likely from large gastric ulcer as well as from esophageal ulcers.  Recommendations ------------------------ -Continue Protonix drip for total 72 hours -She will need Protonix 40 mg twice a day for at least 3 months followed by Protonix 40 mg once a day indefinitely -Consider starting ursodiol to discharge for her PBC once acute issues are resolved  -Okay to have full liquid diet today -Continue observation in the ICU at least for next 24 hours -Continue octreotide drip for today. -GI will follow  Kathi Der MD, FACP 07/06/2022, 10:59 AM  Contact #  [phone]

## 2022-07-06 NOTE — Progress Notes (Signed)
eLink Physician-Brief Progress Note Patient Name: Hannah Blake DOB: 27-Oct-1950 MRN: [id number]   Date of Service  07/06/2022  HPI/Events of Note  Pt received ~ 4 L IVF and han't voided sine admit. BSRN unable to bladder scan d/t Lg amount of ascites. Can BSRN just I&O, she can palpate a full bladder?  PA placed Pt on air bourne/droplet precautions until RVP results. Covid is (-), can the isolation be lifted.  eICU Interventions  In and out cath ordered Droplet precaution discontinued     Intervention Category Intermediate Interventions: Oliguria - evaluation and management;Other:  Darl Pikes 07/06/2022, 2:53 AM

## 2022-07-06 NOTE — Progress Notes (Signed)
eLink Physician-Brief Progress Note Patient Name: Hannah Blake DOB: 19-Mar-1951 MRN: [id number]   Date of Service  07/06/2022  HPI/Events of Note  Notified of Hgb 6.9 from 5 after 2 units PRBC No report of active bleed  eICU Interventions  Transfuse 1 unit PRBC Bedside team to obtain consent     Intervention Category Intermediate Interventions: Other:  Darl Pikes 07/06/2022, 4:27 AM

## 2022-07-06 NOTE — Progress Notes (Signed)
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 BP 88/51 on presentation. Hgb 5, INR 1.5, with complaints of dizziness and feeling cold.   eICU Interventions  GI bleed on PPI and octreotide drip. Given fluids and transfuse 2 units PRBC. Monitor H/H. Ceftriaxone for SBP prophylaxis     Intervention Category Evaluation Type: New Patient Evaluation  Darl Pikes 07/06/2022, 12:40 AM

## 2022-07-06 NOTE — Op Note (Signed)
[facility] Patient Name: Hannah AlbaKim Blake Procedure Date: 07/06/2022 MRN: [id number] Attending MD: Kathi DerParag Clarrissa Shimkus , MD, [id number] Date of Birth: [birth year] CSN: [id number] Age: 71 Admit Type: Inpatient Procedure:                Upper GI endoscopy Indications:              Hematemesis Providers:                Kathi DerParag Orel Hord, MD, Lorenza EvangelistMeredith Walton, RN, Irene Shipperyler                            Rich, Technician, Sheppard CoilAli Carver, CRNA Referring MD:              Medicines:                Sedation Administered by an Anesthesia Professional Complications:            No immediate complications. Estimated Blood Loss:     Estimated blood loss was minimal. Procedure:                Pre-Anesthesia Assessment:                           - Prior to the procedure, a History and Physical                            was performed, and patient medications and                            allergies were reviewed. The patient's tolerance of                            previous anesthesia was also reviewed. The risks                            and benefits of the procedure and the sedation                            options and risks were discussed with the patient.                            All questions were answered, and informed consent                            was obtained. Prior Anticoagulants: The patient has                            taken no anticoagulant or antiplatelet agents. ASA                            Grade Assessment: III - A patient with severe                            systemic disease. After reviewing the risks and  benefits, the patient was deemed in satisfactory                            condition to undergo the procedure.                           After obtaining informed consent, the endoscope was                            passed under direct vision. Throughout the                            procedure, the patient's blood pressure, pulse, and                             oxygen saturations were monitored continuously. The                            GIF-H190 ([id number]) Olympus endoscope was introduced                            through the mouth, and advanced to the second part                            of duodenum. The upper GI endoscopy was performed                            with moderate difficulty. The patient tolerated the                            procedure well. Scope In: Scope Out: Findings:      Few linear esophageal ulcers with stigmata of recent bleeding were found       in the distal esophagus. The largest lesion was 7 mm in largest       dimension.      Clotted blood was found in the entire examined stomach.      One non-bleeding superficial gastric ulcer with pigmented material was       found at the incisura. The lesion was 15 mm in largest dimension.      Two non-bleeding superficial gastric ulcers with no stigmata of bleeding       were found in the prepyloric region of the stomach. The largest lesion       was 8 mm in largest dimension. Biopsies were taken with a cold forceps       for histology.      The cardia and gastric fundus were normal on retroflexion.      Few non-bleeding superficial duodenal ulcers with no stigmata of       bleeding were found in the duodenal bulb. The largest lesion was 4 mm in       largest dimension. Impression:               - Esophageal ulcers with stigmata of recent                            bleeding.                           -  Clotted blood in the entire stomach.                           - Non-bleeding gastric ulcer with pigmented                            material.                           - Non-bleeding gastric ulcers with no stigmata of                            bleeding. Biopsied.                           - Non-bleeding duodenal ulcers with no stigmata of                            bleeding. Moderate Sedation:      Moderate (conscious) sedation was personally  administered by an       anesthesia professional. The following parameters were monitored: oxygen       saturation, heart rate, blood pressure, and response to care. Recommendation:           - Return patient to hospital ward for ongoing care.                           - Clear liquid diet.                           - Continue present medications.                           - Await pathology results.                           - Repeat upper endoscopy in 2 months to check                            healing. Procedure Code(s):        --- Professional ---                           [phone], Esophagogastroduodenoscopy, flexible,                            transoral; with biopsy, single or multiple Diagnosis Code(s):        --- Professional ---                           K22.11, Ulcer of esophagus with bleeding                           K92.2, Gastrointestinal hemorrhage, unspecified                           K25.9, Gastric ulcer, unspecified as acute or  chronic, without hemorrhage or perforation                           K26.9, Duodenal ulcer, unspecified as acute or                            chronic, without hemorrhage or perforation                           K92.0, Hematemesis CPT copyright 2022 [employer]. All rights reserved. The codes documented in this report are preliminary and upon coder review may  be revised to meet current compliance requirements. Kathi Der, MD Kathi Der, MD 07/06/2022 11:07:11 AM Number of Addenda: 0

## 2022-07-06 NOTE — Consult Note (Signed)
Referring Provider: [facility] Primary Care Physician:  Rocky Morel, DO Primary Gastroenterologist: Gentry Fitz  Reason for Consultation: Upper GI bleed, likely from esophageal varices  HPI: Hannah Blake is a 71 y.o. female with past medical history cirrhosis of the liver. 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 Blood work during previous admissions AMA of more than 100, positive ANA and positive ASMA.  Newly diagnosed with cirrhosis in September 2022 but subsequently she went back to [place].  Then again admitted to the hospital in June 2023 with a GI bleed.  Underwent EGD which showed large esophageal varices requiring band ligation.  Also showed few clean-based gastric ulcers.  She was scheduled to follow-up with me in January 2023 but was no-show.  History obtained with help of patient's son.  The patient's son, patient ran out of her liver medicines.  Yesterday she started having vomiting of blood with fresh color blood in the vomiting.  Multiple episode since then.  Had 2 episodes during hospitalization.  Hemoglobin was down to 5.  HGB improved to 6.9 with blood transfusion.  Received another unit of blood transfusion after that.  Stable LFTs.  Elevated INR at 1.5.  Blood work in June 2023 again showed positive antimitochondrial antibody at 123.9.  Normal AFP at that time.  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[facility])     Past Surgical History:  Procedure Laterality Date   BIOPSY  01/14/2022   Procedure: BIOPSY;  Surgeon: Kathi Der, MD;  Location: [facility];  Service: Gastroenterology;;   CLAVICLE SURGERY     ESOPHAGEAL BANDING  01/14/2022   Procedure: ESOPHAGEAL BANDING;  Surgeon: Kathi Der, MD;  Location: [facility];  Service: Gastroenterology;;   ESOPHAGOGASTRODUODENOSCOPY N/A  01/14/2022   Procedure: ESOPHAGOGASTRODUODENOSCOPY (EGD);  Surgeon: Kathi Der, MD;  Location: [facility];  Service: Gastroenterology;  Laterality: N/A;   FRACTURE SURGERY      Prior to Admission medications   Medication Sig Start Date End Date Taking? Authorizing Provider  furosemide (LASIX) 20 MG tablet Take 1 tablet (20 mg total) by mouth daily. 05/01/22 07/06/22 Yes Rocky Morel, DO  pantoprazole (PROTONIX) 40 MG tablet Take 1 tablet (40 mg total) by mouth daily. 05/01/22 07/06/22 Yes Rocky Morel, DO  spironolactone (ALDACTONE) 50 MG tablet Take 1 tablet (50 mg total) by mouth daily. 05/01/22 07/05/22  Rocky Morel, DO  ursodiol (ACTIGALL) 300 MG capsule Take 2 capsules (600 mg total) by mouth 2 (two) times daily. 05/01/22   Rocky Morel, DO  ursodiol (URSO) 250 MG tablet Take 3 tablets (750 mg total) by mouth 2 (two) times daily. 05/01/22   Rocky Morel, DO    Scheduled Meds:  sodium chloride   Intravenous Once   sodium chloride   Intravenous Once   Chlorhexidine Gluconate Cloth  6 each Topical Daily   [START ON 07/09/2022] pantoprazole  40 mg Intravenous Q12H   Continuous Infusions:  sodium chloride     cefTRIAXone (ROCEPHIN)  IV 2 g (07/06/22 0940)   lactated ringers 100 mL/hr at 07/06/22 0919   norepinephrine (LEVOPHED) Adult infusion 2 mcg/min (07/06/22 0919)   octreotide (SANDOSTATIN) 500 mcg in sodium chloride 0.9 % 250 mL (2 mcg/mL) infusion 50 mcg/hr (07/06/22 0919)   pantoprazole 8 mg/hr (07/06/22 0919)   PRN Meds:.docusate sodium, ondansetron (ZOFRAN) IV, polyethylene  glycol  Allergies as of 07/05/2022   (No Known Allergies)    Family History  Family history unknown: Yes    Social History   Socioeconomic History   Marital status: Single    Spouse name: Not on file   Number of children: Not on file   Years of education: Not on file   Highest education level: Not on file  Occupational History   Not on file  Tobacco Use   Smoking  status: Every Day    Packs/day: 1.00    Years: 40.00    Total pack years: 40.00    Types: Cigarettes   Smokeless tobacco: Never  Vaping Use   Vaping Use: Never used  Substance and Sexual Activity   Alcohol use: Not Currently    Comment: weekly   Drug use: Never   Sexual activity: Not on file  Other Topics Concern   Not on file  Social History Narrative   Not on file   Social Determinants of Health   Financial Resource Strain: Not on file  Food Insecurity: Not on file  Transportation Needs: Not on file  Physical Activity: Not on file  Stress: Not on file  Social Connections: Not on file  Intimate Partner Violence: Not on file    Review of Systems: All negative except as stated above in HPI.  Physical Exam: Vital signs: Vitals:   07/06/22 0815 07/06/22 1004  BP: (!) 142/62   Pulse: 91   Resp: 20   Temp: 97.9 F (36.6 C) 97.8 F (36.6 C)  SpO2: 100%    Last BM Date : 07/06/22 General:   Alert,  Well-developed, well-nourished, pleasant and cooperative in NAD Lungs: No visible respiratory distress Heart:  Regular rate and rhythm; no murmurs, clicks, rubs,  or gallops. Abdomen: Soft, mild epigastric tenderness to palpation, nondistended, bowel sounds present, no peritoneal signs Neuro - Alert and oriented x 3 Psych -and affect normal Rectal:  Deferred  GI:  Lab Results: Recent Labs    07/05/22 2056 07/06/22 0319  WBC 18.8* 24.5*  HGB 5.0* 6.9*  HCT 16.7* 21.8*  PLT 310 205   BMET Recent Labs    07/05/22 2056  NA 136  K 4.7  CL 105  CO2 17*  GLUCOSE 142*  BUN 39*  CREATININE 1.15*  CALCIUM 8.4*   LFT Recent Labs    07/05/22 2056  PROT 6.5  ALBUMIN 1.8*  AST 117*  ALT 44  ALKPHOS 323*  BILITOT 2.5*   PT/INR Recent Labs    07/05/22 2129  LABPROT 17.7*  INR 1.5*     Studies/Results: DG Chest Port 1 View  Result Date: 07/05/2022 CLINICAL DATA:  Unstable GI bleed EXAM: PORTABLE CHEST 1 VIEW COMPARISON:  01/12/2022 FINDINGS: Lungs  are essentially clear.  No pleural effusion or pneumothorax. The heart is normal in size. IMPRESSION: No evidence of acute cardiopulmonary disease. Electronically Signed   By: Charline Bills M.D.   On: 07/05/2022 21:24    Impression/Plan: -Hematemesis in a patient with history of cirrhosis and gastric ulcers.  Likely variceal bleed. 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  Patient with persistently positive AMA more than 100.  Never seen in our office.  Did not follow-up in office after last hospital admission. -Compensated cirrhosis with esophageal varices and ascites.  Recommendations ------------------------- -Plan for urgent EGD with possible band ligation today. -Continue supportive care with octreotide drip, Protonix drip, and antibiotics. -Monitor H&H.  Transfuse if  hemoglobin less than 7.  Avoid over transfusion.  Risks (bleeding, infection, bowel perforation that could require surgery, sedation-related changes in cardiopulmonary systems), benefits (identification and possible treatment of source of symptoms, exclusion of certain causes of symptoms), and alternatives (watchful waiting, radiographic imaging studies, empiric medical treatment)  were explained to patient/family in detail and patient wishes to proceed.      LOS: 1 day   Kathi Der  MD, FACP 07/06/2022, 10:21 AM  Contact #  [phone]

## 2022-07-06 NOTE — Progress Notes (Signed)
In/out cath performed and drained from bladder. Urine sample obtained and sent to lab.

## 2022-07-06 NOTE — Anesthesia Postprocedure Evaluation (Signed)
Anesthesia Post Note  Patient: Hannah Blake  Procedure(s) Performed: ESOPHAGOGASTRODUODENOSCOPY (EGD) WITH PROPOFOL BIOPSY     Patient location during evaluation: PACU Anesthesia Type: MAC Level of consciousness: awake Pain management: pain level controlled Vital Signs Assessment: post-procedure vital signs reviewed and stable Respiratory status: spontaneous breathing, nonlabored ventilation and respiratory function stable Cardiovascular status: blood pressure returned to baseline and stable Postop Assessment: no apparent nausea or vomiting Anesthetic complications: no   No notable events documented.  Last Vitals:  Vitals:   07/06/22 1115 07/06/22 1125  BP: 112/64 (!) 116/59  Pulse: 86 86  Resp: 16 14  Temp:    SpO2: 95% 96%    Last Pain:  Vitals:   07/06/22 1125  TempSrc:   PainSc: 0-No pain                 Shalamar Crays P Aubree Doody

## 2022-07-06 NOTE — Anesthesia Preprocedure Evaluation (Addendum)
Anesthesia Evaluation  Patient identified by MRN, date of birth, ID band Patient awake    Reviewed: Allergy & Precautions, NPO status , Patient's Chart, lab work & pertinent test results  Airway Mallampati: III  TM Distance: >3 FB Neck ROM: Full    Dental  (+) Upper Dentures, Lower Dentures   Pulmonary Current Smoker and Patient abstained from smoking.   Pulmonary exam normal        Cardiovascular +CHF  Normal cardiovascular exam  ECHO: 1. LVEF is 45 to 50% with hypokinesis/akinesis of the distal  inferior,distal lateral,distal inferoseptal walls . Left ventricular  ejection fraction, by estimation, is 40 to 45%. The left ventricle has  mildly decreased function. Indeterminate diastolic  filling due to E-A fusion.  2. Right ventricular systolic function is normal. The right ventricular  size is normal.  3. Left atrial size was mildly dilated.  4. The mitral valve is normal in structure. Trivial mitral valve  regurgitation.  5. The aortic valve is normal in structure. Aortic valve regurgitation is  mild.     Neuro/Psych  Neuromuscular disease  negative psych ROS   GI/Hepatic PUD,,,(+) Cirrhosis       , Hepatitis -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Endo/Other  negative endocrine ROS    Renal/GU Renal disease     Musculoskeletal negative musculoskeletal ROS (+)    Abdominal   Peds  Hematology  (+) Blood dyscrasia, anemia INR: 1.5   Anesthesia Other Findings GI bleed Cirrhosis  Reproductive/Obstetrics                             Anesthesia Physical Anesthesia Plan  ASA: 4  Anesthesia Plan: MAC   Post-op Pain Management:    Induction: Intravenous  PONV Risk Score and Plan: 1 and Propofol infusion and Treatment may vary due to age or medical condition  Airway Management Planned: Nasal Cannula  Additional Equipment:   Intra-op Plan:   Post-operative Plan:   Informed Consent:       Interpreter used for interveiw  Plan Discussed with: CRNA  Anesthesia Plan Comments:         Anesthesia Quick Evaluation

## 2022-07-06 NOTE — Transfer of Care (Signed)
Immediate Anesthesia Transfer of Care Note  Patient: Hannah Blake  Procedure(s) Performed: ESOPHAGOGASTRODUODENOSCOPY (EGD) WITH PROPOFOL BIOPSY  Patient Location: PACU  Anesthesia Type:MAC  Level of Consciousness: awake, alert , oriented, and patient cooperative  Airway & Oxygen Therapy: Patient Spontanous Breathing  Post-op Assessment: Report given to RN, Post -op Vital signs reviewed and stable, and Post -op Vital signs reviewed and unstable, Anesthesiologist notified  Post vital signs: Reviewed, stable, and unstable--informed MDA via phone of BP. Pt mentating approriately. Albumin started and Norepinephrine gtt started. Care resumed by RN.  Last Vitals:  Vitals Value Taken Time  BP 81/51 07/06/22 1109  Temp    Pulse 84 07/06/22 1110  Resp 15 07/06/22 1110  SpO2 96 % 07/06/22 1110  Vitals shown include unvalidated device data.  Last Pain:  Vitals:   07/06/22 1034  TempSrc: Temporal  PainSc: 0-No pain         Complications: No notable events documented.

## 2022-07-06 NOTE — Progress Notes (Signed)
NAME:  Hannah Blake, MRN:  [id number], DOB:  1951/03/02, LOS: 1 ADMISSION DATE:  07/05/2022, CONSULTATION DATE:  07/05/22 REFERRING MD:  [facility] ER CHIEF COMPLAINT:  GI Bleed   History of Present Illness:  Hannah Blake is a 71 year old woman with cirrhosis, esophageal varices s/p banding 01/2022, coronary artery disease and HFpEF who presented to [facility] after vomiting blood two times. She continued to vomit two more times since being in the ED of dark blood. She has been in her normal state per grandson at the bedside until today. She complains of dizziness and feeling cold.   In the ER, hemoglobin is 5g/dL, Cr 1.15 (baseline 0.6), serum bicarb 17, Alk phos 323, albumin 1.8, T. Bili 2.5, Lactic Acid 7.4 and WBC count of 18.8. INR 1.5.  PCCM called to admit for GI bleed and shock. GI has been consulted with current plan for EGD tomorrow morning.   Pertinent  Medical History   Past Medical History:  Diagnosis Date   Bell's palsy    Cirrhosis ([place])    Coronary artery disease    Heart failure with reduced ejection fraction ([place])    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([place])    Significant Hospital Events: Including procedures, antibiotic start and stop dates in addition to other pertinent events   12/16 - Admitted to ICU for hematemesis 12/17 3 units PRBC so far  Interim History / Subjective:   Off Levophed this morning. 3 units PRBC transfused. 1 unit PRBC after hemoglobin of 6.9  Objective   Blood pressure (!) 142/62, pulse 91, temperature 97.9 F (36.6 C), temperature source Oral, resp. rate 20, height _0  (1.499 m), weight 47 kg, SpO2 100 %.        Intake/Output Summary (Last 24 hours) at 07/06/2022 0900 Last data filed at 07/06/2022 0818 Gross per 24 hour  Intake 2762.1 ml  Output 300 ml  Net 2462.1 ml   Filed Weights   07/05/22 2013  Weight: 47 kg   Examination: General:  thin woman sitting up in bed, no distress HENT: [place]/AT, sclera anicteric, mild pallor, moist mucous  membranes Lungs: clear to auscultation, no wheezing Cardiovascular: rrr, no murmurs Abdomen: soft, non-tender, mild distended Extremities: warm, no edema Neuro: alert, oriented to place and person, interactive GU: n/a  Labs show hemoglobin 6.9, increase leukocytosis, BUN/creatinine 39/1.1  Resolved Hospital Problem list     Assessment & Plan:  Shock- hemorrhagic due to GI bleeding Lactic Acidosis, resolved -  off Levophed -Received 3 unit PRBC, lactate improved  Acute GI Bleeding Liver Cirrhosis Esophageal Varices Coagulopathy - GI consulted, plan for EGD today - IV PPI drip - IV octreotide drip - IV Vitamin K x1 given - Ceftriaxone 2g daily IV for 5 days  Acute Blood Loss Anemia -3 units PRBC so far - goal hemoglobin 7g/dL or greater - check CBC post transfusion then q4 hours  Acute Kidney Injury - volume resuscitation as above - monitor Cr  HFpEF CAD - will monitor for volume overload given need for aggressive volume resuscitation -Holding Lasix/Aldactone  Best Practice (right click and "Reselect all SmartList Selections" daily)   Diet/type: NPO DVT prophylaxis: not indicated GI prophylaxis: PPI Lines: N/A Foley:  N/A Code Status:  full code Last date of multidisciplinary goals of care discussion [12/16]  Labs   CBC: Recent Labs  Lab 07/05/22 2056 07/06/22 0319  WBC 18.8* 24.5*  HGB 5.0* 6.9*  HCT 16.7* 21.8*  MCV 90.3 84.2  PLT 310 205  Basic Metabolic Panel: Recent Labs  Lab 07/05/22 2056 07/06/22 0319  NA 136  --   K 4.7  --   CL 105  --   CO2 17*  --   GLUCOSE 142*  --   BUN 39*  --   CREATININE 1.15*  --   CALCIUM 8.4*  --   MG  --  1.9  PHOS  --  5.6*    GFR: Estimated Creatinine Clearance: 30.2 mL/min (A) (by C-G formula based on SCr of 1.15 mg/dL (H)). Recent Labs  Lab 07/05/22 2056 07/06/22 0319  WBC 18.8* 24.5*  LATICACIDVEN 7.4* 3.1*     Liver Function Tests: Recent Labs  Lab 07/05/22 2056  AST 117*   ALT 44  ALKPHOS 323*  BILITOT 2.5*  PROT 6.5  ALBUMIN 1.8*    No results for input(s): "LIPASE", "AMYLASE" in the last 168 hours. No results for input(s): "AMMONIA" in the last 168 hours.  ABG    Component Value Date/Time   HCO3 22.5 01/12/2022 2050   ACIDBASEDEF 2.5 (H) 01/12/2022 2050   O2SAT 49.2 01/12/2022 2050     Coagulation Profile: Recent Labs  Lab 07/05/22 2129  INR 1.5*     Cardiac Enzymes: No results for input(s): "CKTOTAL", "CKMB", "CKMBINDEX", "TROPONINI" in the last 168 hours.  HbA1C: No results found for: "HGBA1C"  CBG: No results for input(s): "GLUCAP" in the last 168 hours.  Critical care time: 31 minutes    Aubreanna Percle V. Elsworth Soho MD [facility] Office: [phone]   See Amion for personal pager PCCM on call pager [phone] until 7pm. Please call Elink 7p-7a. [phone]

## 2022-07-07 DIAGNOSIS — I5022 Chronic systolic (congestive) heart failure: Secondary | ICD-10-CM | POA: Diagnosis present

## 2022-07-07 DIAGNOSIS — I8510 Secondary esophageal varices without bleeding: Secondary | ICD-10-CM

## 2022-07-07 DIAGNOSIS — D500 Iron deficiency anemia secondary to blood loss (chronic): Secondary | ICD-10-CM

## 2022-07-07 DIAGNOSIS — K922 Gastrointestinal hemorrhage, unspecified: Secondary | ICD-10-CM

## 2022-07-07 DIAGNOSIS — K7460 Unspecified cirrhosis of liver: Secondary | ICD-10-CM

## 2022-07-07 DIAGNOSIS — K219 Gastro-esophageal reflux disease without esophagitis: Secondary | ICD-10-CM

## 2022-07-07 DIAGNOSIS — K7030 Alcoholic cirrhosis of liver without ascites: Secondary | ICD-10-CM

## 2022-07-07 HISTORY — DX: Chronic systolic (congestive) heart failure: I50.22

## 2022-07-07 HISTORY — DX: Iron deficiency anemia secondary to blood loss (chronic): D50.0

## 2022-07-07 LAB — BASIC METABOLIC PANEL
Anion gap: <3 — ABNORMAL LOW (ref 5–15)
BUN: 38 mg/dL — ABNORMAL HIGH (ref 8–23)
CO2: 23 mmol/L (ref 22–32)
Calcium: 7.9 mg/dL — ABNORMAL LOW (ref 8.9–10.3)
Chloride: 111 mmol/L (ref 98–111)
Creatinine, Ser: 0.83 mg/dL (ref 0.44–1.00)
GFR, Estimated: >60 mL/min (ref >60)
Glucose, Bld: 100 mg/dL — ABNORMAL HIGH (ref 70–99)
Potassium: 4.6 mmol/L (ref 3.5–5.1)
Sodium: 136 mmol/L (ref 135–145)

## 2022-07-07 LAB — TYPE AND SCREEN
ABO/RH(D): B POS
Antibody Screen: NEGATIVE
Unit division: 0
Unit division: 0
Unit division: 0
Unit division: 0
Unit division: 0

## 2022-07-07 LAB — CBC
HCT: 24.3 % — ABNORMAL LOW (ref 36.0–46.0)
HCT: 27.1 % — ABNORMAL LOW (ref 36.0–46.0)
Hemoglobin: 8.2 g/dL — ABNORMAL LOW (ref 12.0–15.0)
Hemoglobin: 8.7 g/dL — ABNORMAL LOW (ref 12.0–15.0)
MCH: 28.9 pg (ref 26.0–34.0)
MCH: 28.2 pg (ref 26.0–34.0)
MCHC: 33.7 g/dL (ref 30.0–36.0)
MCHC: 32.1 g/dL (ref 30.0–36.0)
MCV: 85.6 fL (ref 80.0–100.0)
MCV: 88 fL (ref 80.0–100.0)
Platelets: 141 10*3/uL — ABNORMAL LOW (ref 150–400)
Platelets: 147 10*3/uL — ABNORMAL LOW (ref 150–400)
RBC: 2.84 MIL/uL — ABNORMAL LOW (ref 3.87–5.11)
RBC: 3.08 MIL/uL — ABNORMAL LOW (ref 3.87–5.11)
RDW: 16.1 % — ABNORMAL HIGH (ref 11.5–15.5)
RDW: 16.8 % — ABNORMAL HIGH (ref 11.5–15.5)
WBC: 7.4 10*3/uL (ref 4.0–10.5)
WBC: 8.5 10*3/uL (ref 4.0–10.5)
nRBC: 0.7 % — ABNORMAL HIGH (ref 0.0–0.2)
nRBC: 0 % (ref 0.0–0.2)

## 2022-07-07 LAB — BPAM RBC
Blood Product Expiration Date: 202401092359
Blood Product Expiration Date: 202401102359
Blood Product Expiration Date: 202401102359
Blood Product Expiration Date: 202401122359
Blood Product Expiration Date: 202401132359
ISSUE DATE / TIME: 202312162134
ISSUE DATE / TIME: 202312162231
ISSUE DATE / TIME: 202312170525
ISSUE DATE / TIME: 202312171434
Unit Type and Rh: 7300
Unit Type and Rh: 7300
Unit Type and Rh: 7300
Unit Type and Rh: 7300
Unit Type and Rh: 9500

## 2022-07-07 LAB — PHOSPHORUS: Phosphorus: 2.2 mg/dL — ABNORMAL LOW (ref 2.5–4.6)

## 2022-07-07 LAB — MAGNESIUM: Magnesium: 2 mg/dL (ref 1.7–2.4)

## 2022-07-07 MED ORDER — SODIUM PHOSPHATES 45 MMOLE/15ML IV SOLN
15.0000 mmol | Freq: Once | INTRAVENOUS | Status: AC
  Administered 2022-07-07: 15 mmol via INTRAVENOUS
  Filled 2022-07-07: qty 5

## 2022-07-07 MED ORDER — PANTOPRAZOLE SODIUM 40 MG PO TBEC
40.0000 mg | DELAYED_RELEASE_TABLET | Freq: Two times a day (BID) | ORAL | Status: DC
  Administered 2022-07-07 – 2022-07-09 (×4): 40 mg via ORAL
  Filled 2022-07-07 (×4): qty 1

## 2022-07-07 MED ORDER — MORPHINE SULFATE (PF) 2 MG/ML IV SOLN
2.0000 mg | INTRAVENOUS | Status: DC | PRN
  Administered 2022-07-07: 2 mg via INTRAVENOUS
  Filled 2022-07-07: qty 1

## 2022-07-07 MED ORDER — OXYCODONE HCL 5 MG PO TABS
2.5000 mg | ORAL_TABLET | ORAL | Status: DC | PRN
  Administered 2022-07-08: 2.5 mg via ORAL
  Filled 2022-07-07: qty 1

## 2022-07-07 MED ORDER — LIDOCAINE 5 % EX PTCH
1.0000 | MEDICATED_PATCH | CUTANEOUS | Status: DC
  Administered 2022-07-07 – 2022-07-08 (×2): 1 via TRANSDERMAL
  Filled 2022-07-07 (×2): qty 1

## 2022-07-07 NOTE — Progress Notes (Signed)
PROGRESS NOTE   Hannah Blake  [id number] DOB: 05-01-1951 DOA: 07/05/2022 PCP: Johny Blamer, DO   Date of Service: the patient was seen and examined on 07/07/2022  Brief Narrative:  71 year old female with past medical history of hepatic cirrhosis (Dx 03/2021, thought to be multifactorial due to alcohol and possible primary biliary cholangitis), hospitalization 12/2021 for GI bleeding with EGD identifying esophageal varices and multiple gastric ulcers status post banding x 2.   Patient now presented to [facility] emergency department with complaints of hematemesis.  Upon evaluation in the emergency department patient was found to have a hemoglobin of 5, markedly elevated lactic acid of 7.4 and severe hypotension consistent with hemorrhagic shock.  PCCM was contacted and patient was admitted to the ICU.  Patient was initially placed on Levophed for vasopressor support.  Upon admission to the intensive care unit, patient was transfused a total of 4 units of packed red blood cells and was additionally given at least 4 L of isotonic fluids.  Patient was initiated on intravenous Protonix and octreotide as well as intravenous vitamin K due to elevated INR.  Patient was also placed on prophylactic ceftriaxone.    Dr. Alessandra Bevels with gastroenterology was consulted and patient underwent EGD morning of 12/17 revealing linear esophageal ulcers with stigmata of recent bleeding in the distal esophagus, clotted blood throughout the stomach, 3 gastric ulcers with one found in the incisura and 2 found in the prepyloric region as well as several superficial duodenal ulcers.  Biopsies were obtained.  That day, patient was initiated on a clear liquid diet.  Patient has been weaned off of Levophed and the patient has now been transferred to the hospital service on 12/18.   Assessment and Plan: * Acute upper GI bleeding Patient underwent EGD 12/17 performed by Dr. Alessandra Bevels revealing ulcerations in the  esophagus stomach and duodenum with blood clots noted throughout the stomach.  Esophageal ulcers in particular had stigmata of recent bleeding Per GI recommendations octreotide has been discontinued today Per GI recommendations intravenous Protonix has been transitioned to 40 mg by mouth twice daily Patient has been receiving ceftriaxone for SBP prophylaxis Continue to monitor hemoglobin and hematocrit with serial CBCs Will transfuse for hemoglobin of less than 7 Diet has been advanced today Will monitor closely over the next 24 hours for any evidence of repeat bleeding  Esophageal varices in alcoholic cirrhosis (HCC) No evidence of persisting varices on EGD on 12/17  Hepatic cirrhosis (HCC) Multifactorial secondary to alcohol abuse and concern for primary biliary cholangitis Reports continued abstinence of alcohol Supportive care Long-term poor prognosis Patient to follow-up closely as an outpatient with [facility] gastroenterology after discharge  Iron deficiency anemia due to chronic blood loss Repeating iron panel Patient may benefit from iron infusion prior to discharge  Chronic systolic CHF (congestive heart failure) (HCC) No clinical evidence of cardiogenic volume overload Strict input and output monitoring Daily weights Low-sodium diet   GERD (gastroesophageal reflux disease) On twice daily PPI as noted above.    Subjective:  Patient is currently complaining of back pain, located in the lower back, sharp in quality, moderate intensity, nonradiating.  Physical Exam:  Vitals:   07/07/22 1600 07/07/22 1700 07/07/22 1859 07/07/22 2000  BP: (!) 102/42 (!) 122/50  122/79  Pulse: 72 78  87  Resp: 17 19  19   Temp: 98.9 F (37.2 C)  98.2 F (36.8 C)   TempSrc: Oral  Oral   SpO2: 98% 97%  100%  Weight:  Height:        Constitutional: Awake alert and oriented x3, no associated distress.  Thin build. Skin: no rashes, no lesions, poor skin turgor noted. Eyes: Pupils  are equally reactive to light.  No evidence of scleral icterus or conjunctival pallor.  ENMT: Moist mucous membranes noted.  Posterior pharynx clear of any exudate or lesions.   Respiratory: clear to auscultation bilaterally, no wheezing, no crackles. Normal respiratory effort. No accessory muscle use.  Cardiovascular: Regular rate and rhythm, no murmurs / rubs / gallops. No extremity edema. 2+ pedal pulses. No carotid bruits.  Abdomen: Slightly protuberant abdomen.  Abdomen is soft.  Diffuse mild tenderness.  No evidence of intra-abdominal masses.  Positive bowel sounds noted in all quadrants.   Musculoskeletal: No joint deformity upper and lower extremities. Good ROM, no contractures. Poor muscle tone.    Data Reviewed:  I have personally reviewed and interpreted labs, imaging.  Significant findings are   CBC: Recent Labs  Lab 07/05/22 2056 07/06/22 0319 07/06/22 1331 07/06/22 1830 07/07/22 0328 07/07/22 1927  WBC 18.8* 24.5* 9.2  --  7.4 8.5  HGB 5.0* 6.9* 7.2* 8.4* 8.2* 8.7*  HCT 16.7* 21.8* 21.8* 25.3* 24.3* 27.1*  MCV 90.3 84.2 84.5  --  85.6 88.0  PLT 310 205 135*  --  141* 147*   Basic Metabolic Panel: Recent Labs  Lab 07/05/22 2056 07/06/22 0319 07/07/22 0328  NA 136  --  136  K 4.7  --  4.6  CL 105  --  111  CO2 17*  --  23  GLUCOSE 142*  --  100*  BUN 39*  --  38*  CREATININE 1.15*  --  0.83  CALCIUM 8.4*  --  7.9*  MG  --  1.9 2.0  PHOS  --  5.6* 2.2*   GFR: Estimated Creatinine Clearance: 41.8 mL/min (by C-G formula based on SCr of 0.83 mg/dL). Liver Function Tests: Recent Labs  Lab 07/05/22 2056  AST 117*  ALT 44  ALKPHOS 323*  BILITOT 2.5*  PROT 6.5  ALBUMIN 1.8*    Coagulation Profile: Recent Labs  Lab 07/05/22 2129  INR 1.5*      Code Status:  Full code.  Code status decision has been confirmed with: patient Family Communication: Lucila Maine is at the bedside and has been updated on plan of care   Severity of Illness:  The  appropriate patient status for this patient is INPATIENT. Inpatient status is judged to be reasonable and necessary in order to provide the required intensity of service to ensure the patient's safety. The patient's presenting symptoms, physical exam findings, and initial radiographic and laboratory data in the context of their chronic comorbidities is felt to place them at high risk for further clinical deterioration. Furthermore, it is not anticipated that the patient will be medically stable for discharge from the hospital within 2 midnights of admission.   * I certify that at the point of admission it is my clinical judgment that the patient will require inpatient hospital care spanning beyond 2 midnights from the point of admission due to high intensity of service, high risk for further deterioration and high frequency of surveillance required.*  Time spent:  55 minutes  Author:  Marinda Elk MD  07/07/2022 8:53 PM

## 2022-07-07 NOTE — Progress Notes (Signed)
CCM Pharmacy Monitoring: Pharmacy may replace electrolytes per Elink protocol  Today, 07/07/22 Potassium 4.6 Phosphorus 2.2 Magnesium 2.0   Plan, Sodium Phosphate 15 mmol IV x 1 BMET and phosphorus with am labs   Thank you for allowing pharmacy to be a part of this patient's care.  Selinda Eon, PharmD, BCPS Clinical Pharmacist  Please utilize Amion for appropriate phone number to reach the unit pharmacist [facility]) 07/07/2022 9:15 AM

## 2022-07-07 NOTE — Assessment & Plan Note (Signed)
No evidence of persisting varices on EGD on 12/17

## 2022-07-07 NOTE — Assessment & Plan Note (Signed)
On twice daily PPI as noted above.

## 2022-07-07 NOTE — Progress Notes (Signed)
Subjective: No further melena. No abdominal pain.  Objective: Vital signs in last 24 hours: Temp:  [97.8 F (36.6 C)-98.9 F (37.2 C)] 98.9 F (37.2 C) (12/18 1200) Pulse Rate:  [79-109] 79 (12/18 1100) Resp:  [11-26] 17 (12/18 1100) BP: (78-113)/(39-61) 113/56 (12/18 1100) SpO2:  [88 %-100 %] 99 % (12/18 1100) Weight change: 0 kg Last BM Date : 07/07/22  PE: GEN:  NAD, thin, chronically ill-appearing ABD:  Soft, non-tender  Lab Results: CBC    Component Value Date/Time   WBC 7.4 07/07/2022 0328   RBC 2.84 (L) 07/07/2022 0328   HGB 8.2 (L) 07/07/2022 0328   HGB 8.0 (L) 05/27/2021 1504   HCT 24.3 (L) 07/07/2022 0328   HCT 22.7 (L) 05/27/2021 1504   PLT 141 (L) 07/07/2022 0328   PLT 221 05/27/2021 1504   MCV 85.6 07/07/2022 0328   MCV 88 05/27/2021 1504   MCH 28.9 07/07/2022 0328   MCHC 33.7 07/07/2022 0328   RDW 16.1 (H) 07/07/2022 0328   RDW 15.8 (H) 05/27/2021 1504   LYMPHSABS 1.2 04/16/2022 1147   LYMPHSABS 2.5 05/27/2021 1504   MONOABS 0.7 04/16/2022 1147   EOSABS 1.0 (H) 04/16/2022 1147   EOSABS 0.3 05/27/2021 1504   BASOSABS 0.0 04/16/2022 1147   BASOSABS 0.1 05/27/2021 1504  CMP     Component Value Date/Time   NA 136 07/07/2022 0328   NA 132 (L) 05/27/2021 1504   K 4.6 07/07/2022 0328   CL 111 07/07/2022 0328   CO2 23 07/07/2022 0328   GLUCOSE 100 (H) 07/07/2022 0328   BUN 38 (H) 07/07/2022 0328   BUN 20 05/27/2021 1504   CREATININE 0.83 07/07/2022 0328   CALCIUM 7.9 (L) 07/07/2022 0328   PROT 6.5 07/05/2022 2056   PROT 8.0 05/27/2021 1504   ALBUMIN 1.8 (L) 07/05/2022 2056   ALBUMIN 2.4 (L) 05/27/2021 1504   AST 117 (H) 07/05/2022 2056   ALT 44 07/05/2022 2056   ALKPHOS 323 (H) 07/05/2022 2056   BILITOT 2.5 (H) 07/05/2022 2056   BILITOT 5.2 (H) 05/27/2021 1504   GFRNONAA >60 07/07/2022 0328   Assessment:   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with cirrhosis. Esophageal ulcer. Large duodenal ulcer, endoscopic treatment.  Unclear etiology.   Biopsies pending.  Plan:   Stop octreotide. Convert pantoprazole to PO. Daily CBC, transfuse as needed. Advance diet. Await gastric biopsies for H. Pylori assessment. [name] will follow; maybe home in 1-2 days.   Hannah Blake 07/07/2022, 12:59 PM   Cell [phone] If no answer or after 5 PM call [phone]

## 2022-07-07 NOTE — Assessment & Plan Note (Signed)
Repeating iron panel Patient may benefit from iron infusion prior to discharge

## 2022-07-07 NOTE — Hospital Course (Addendum)
71 year old female with past medical history of hepatic cirrhosis (Dx 03/2021, thought to be multifactorial due to alcohol and possible primary biliary cholangitis), hospitalization 12/2021 for GI bleeding with EGD identifying esophageal varices and multiple gastric ulcers status post banding x 2.   Patient now presented to [facility] emergency department with complaints of hematemesis.  Upon evaluation in the emergency department patient was found to have a hemoglobin of 5, markedly elevated lactic acid of 7.4 and severe hypotension consistent with hemorrhagic shock.  PCCM was contacted and patient was admitted to the ICU.  Patient was initially placed on Levophed for vasopressor support.  Upon admission to the intensive care unit, patient was transfused a total of 4 units of packed red blood cells and was additionally given at least 4 L of isotonic fluids.  Patient was initiated on intravenous Protonix and octreotide as well as intravenous vitamin K due to elevated INR.  Patient was also placed on prophylactic ceftriaxone.    Dr. Levora Angel with gastroenterology was consulted and patient underwent EGD morning of 12/17 revealing linear esophageal ulcers with stigmata of recent bleeding in the distal esophagus, clotted blood throughout the stomach, 3 gastric ulcers with one found in the incisura and 2 found in the prepyloric region as well as several superficial duodenal ulcers.  Biopsies were obtained.  That day, patient was initiated on a clear liquid diet.  Patient has been weaned off of Levophed and the patient has now been transferred to the hospital service on 12/18.

## 2022-07-07 NOTE — Assessment & Plan Note (Signed)
Patient underwent EGD 12/17 performed by Dr. Levora Angel revealing ulcerations in the esophagus stomach and duodenum with blood clots noted throughout the stomach.  Esophageal ulcers in particular had stigmata of recent bleeding Per GI recommendations octreotide has been discontinued today Per GI recommendations intravenous Protonix has been transitioned to 40 mg by mouth twice daily Patient has been receiving ceftriaxone for SBP prophylaxis Continue to monitor hemoglobin and hematocrit with serial CBCs Will transfuse for hemoglobin of less than 7 Diet has been advanced today Will monitor closely over the next 24 hours for any evidence of repeat bleeding

## 2022-07-07 NOTE — Assessment & Plan Note (Addendum)
" >>  ASSESSMENT AND PLAN FOR DECOMPENSATED HEPATIC CIRRHOSIS (HCC) WRITTEN ON 11/25/2023  7:21 AM BY FERNAND PROST, MD   >>ASSESSMENT AND PLAN FOR HEPATIC CIRRHOSIS (HCC) WRITTEN ON 07/07/2022  8:53 PM BY KENARD ZACHARY PARAS, MD  Multifactorial secondary to alcohol abuse and concern for primary biliary cholangitis Reports continued abstinence of alcohol Supportive care Long-term poor prognosis Patient to follow-up closely as an outpatient with [facility] gastroenterology after discharge   >>ASSESSMENT AND PLAN FOR ESOPHAGEAL VARICES IN ALCOHOLIC CIRRHOSIS (HCC) WRITTEN ON 07/07/2022  8:52 PM BY KENARD ZACHARY PARAS, MD  No evidence of persisting varices on EGD on 12/17   >>ASSESSMENT AND PLAN FOR PRIMARY BILIARY CHOLANGITIS (HCC) WRITTEN ON 07/08/2022  9:54 PM BY KENARD ZACHARY PARAS, MD  GI recommending initiation of ursodiol  300 mg twice daily. Per GI, patient to follow-up with Dr. Elicia in the neck several weeks after discharge. "

## 2022-07-07 NOTE — Assessment & Plan Note (Signed)
No clinical evidence of cardiogenic volume overload Strict input and output monitoring Daily weights Low-sodium diet  

## 2022-07-08 ENCOUNTER — Encounter (HOSPITAL_COMMUNITY): Payer: Self-pay | Admitting: Gastroenterology

## 2022-07-08 DIAGNOSIS — K743 Primary biliary cirrhosis: Secondary | ICD-10-CM

## 2022-07-08 LAB — CBC WITH DIFFERENTIAL/PLATELET
Abs Immature Granulocytes: 0.03 10*3/uL (ref 0.00–0.07)
Basophils Absolute: 0 10*3/uL (ref 0.0–0.1)
Basophils Relative: 0 %
Eosinophils Absolute: 0.4 10*3/uL (ref 0.0–0.5)
Eosinophils Relative: 5 %
HCT: 26.8 % — ABNORMAL LOW (ref 36.0–46.0)
Hemoglobin: 8.4 g/dL — ABNORMAL LOW (ref 12.0–15.0)
Immature Granulocytes: 0 %
Lymphocytes Relative: 20 %
Lymphs Abs: 1.6 10*3/uL (ref 0.7–4.0)
MCH: 27.8 pg (ref 26.0–34.0)
MCHC: 31.3 g/dL (ref 30.0–36.0)
MCV: 88.7 fL (ref 80.0–100.0)
Monocytes Absolute: 1.1 10*3/uL — ABNORMAL HIGH (ref 0.1–1.0)
Monocytes Relative: 14 %
Neutro Abs: 4.7 10*3/uL (ref 1.7–7.7)
Neutrophils Relative %: 61 %
Platelets: 144 10*3/uL — ABNORMAL LOW (ref 150–400)
RBC: 3.02 MIL/uL — ABNORMAL LOW (ref 3.87–5.11)
RDW: 16.9 % — ABNORMAL HIGH (ref 11.5–15.5)
WBC: 7.9 10*3/uL (ref 4.0–10.5)
nRBC: 0 % (ref 0.0–0.2)

## 2022-07-08 LAB — COMPREHENSIVE METABOLIC PANEL
ALT: 51 U/L — ABNORMAL HIGH (ref 0–44)
AST: 94 U/L — ABNORMAL HIGH (ref 15–41)
Albumin: 1.7 g/dL — ABNORMAL LOW (ref 3.5–5.0)
Alkaline Phosphatase: 276 U/L — ABNORMAL HIGH (ref 38–126)
Anion gap: 4 — ABNORMAL LOW (ref 5–15)
BUN: 23 mg/dL (ref 8–23)
CO2: 24 mmol/L (ref 22–32)
Calcium: 7.9 mg/dL — ABNORMAL LOW (ref 8.9–10.3)
Chloride: 107 mmol/L (ref 98–111)
Creatinine, Ser: 0.79 mg/dL (ref 0.44–1.00)
GFR, Estimated: >60 mL/min (ref >60)
Glucose, Bld: 94 mg/dL (ref 70–99)
Potassium: 4 mmol/L (ref 3.5–5.1)
Sodium: 135 mmol/L (ref 135–145)
Total Bilirubin: 2.4 mg/dL — ABNORMAL HIGH (ref 0.3–1.2)
Total Protein: 6.2 g/dL — ABNORMAL LOW (ref 6.5–8.1)

## 2022-07-08 LAB — IRON AND TIBC
Iron: 24 ug/dL — ABNORMAL LOW (ref 28–170)
Saturation Ratios: 5 % — ABNORMAL LOW (ref 10.4–31.8)
TIBC: 442 ug/dL (ref 250–450)
UIBC: 418 ug/dL

## 2022-07-08 LAB — PHOSPHORUS: Phosphorus: 2.7 mg/dL (ref 2.5–4.6)

## 2022-07-08 LAB — MAGNESIUM: Magnesium: 1.8 mg/dL (ref 1.7–2.4)

## 2022-07-08 LAB — SURGICAL PATHOLOGY

## 2022-07-08 MED ORDER — SODIUM CHLORIDE 0.9 % IV SOLN
250.0000 mg | Freq: Every day | INTRAVENOUS | Status: AC
  Administered 2022-07-08 – 2022-07-09 (×2): 250 mg via INTRAVENOUS
  Filled 2022-07-08 (×2): qty 20

## 2022-07-08 MED ORDER — CIPROFLOXACIN HCL 500 MG PO TABS
500.0000 mg | ORAL_TABLET | Freq: Two times a day (BID) | ORAL | Status: DC
  Administered 2022-07-09: 500 mg via ORAL
  Filled 2022-07-08: qty 1

## 2022-07-08 MED ORDER — URSODIOL 300 MG PO CAPS
300.0000 mg | ORAL_CAPSULE | Freq: Two times a day (BID) | ORAL | Status: DC
  Administered 2022-07-08 – 2022-07-09 (×3): 300 mg via ORAL
  Filled 2022-07-08 (×3): qty 1

## 2022-07-08 MED ORDER — MAGNESIUM SULFATE 2 GM/50ML IV SOLN
2.0000 g | Freq: Once | INTRAVENOUS | Status: AC
  Administered 2022-07-08: 2 g via INTRAVENOUS
  Filled 2022-07-08: qty 50

## 2022-07-08 NOTE — Progress Notes (Signed)
Subjective: No abdominal pain No further blood in stool.  Objective: Vital signs in last 24 hours: Temp:  [97.5 F (36.4 C)-98.9 F (37.2 C)] 97.5 F (36.4 C) (12/19 1200) Pulse Rate:  [64-88] 80 (12/19 0800) Resp:  [11-24] 14 (12/19 0800) BP: (95-127)/(42-79) 110/49 (12/19 0800) SpO2:  [91 %-100 %] 100 % (12/19 0800) Weight:  [45.7 kg] 45.7 kg (12/19 0500) Weight change: -1.3 kg Last BM Date : 07/08/22  PE: GEN: NAD NEURO:  No encephalopathy MSK:  Thin, cachectic  Lab Results: CBC    Component Value Date/Time   WBC 7.9 07/08/2022 0336   RBC 3.02 (L) 07/08/2022 0336   HGB 8.4 (L) 07/08/2022 0336   HGB 8.0 (L) 05/27/2021 1504   HCT 26.8 (L) 07/08/2022 0336   HCT 22.7 (L) 05/27/2021 1504   PLT 144 (L) 07/08/2022 0336   PLT 221 05/27/2021 1504   MCV 88.7 07/08/2022 0336   MCV 88 05/27/2021 1504   MCH 27.8 07/08/2022 0336   MCHC 31.3 07/08/2022 0336   RDW 16.9 (H) 07/08/2022 0336   RDW 15.8 (H) 05/27/2021 1504   LYMPHSABS 1.6 07/08/2022 0336   LYMPHSABS 2.5 05/27/2021 1504   MONOABS 1.1 (H) 07/08/2022 0336   EOSABS 0.4 07/08/2022 0336   EOSABS 0.3 05/27/2021 1504   BASOSABS 0.0 07/08/2022 0336   BASOSABS 0.1 05/27/2021 1504  CMP     Component Value Date/Time   NA 135 07/08/2022 0336   NA 132 (L) 05/27/2021 1504   K 4.0 07/08/2022 0336   CL 107 07/08/2022 0336   CO2 24 07/08/2022 0336   GLUCOSE 94 07/08/2022 0336   BUN 23 07/08/2022 0336   BUN 20 05/27/2021 1504   CREATININE 0.79 07/08/2022 0336   CALCIUM 7.9 (L) 07/08/2022 0336   PROT 6.2 (L) 07/08/2022 0336   PROT 8.0 05/27/2021 1504   ALBUMIN 1.7 (L) 07/08/2022 0336   ALBUMIN 2.4 (L) 05/27/2021 1504   AST 94 (H) 07/08/2022 0336   ALT 51 (H) 07/08/2022 0336   ALKPHOS 276 (H) 07/08/2022 0336   BILITOT 2.4 (H) 07/08/2022 0336   BILITOT 5.2 (H) 05/27/2021 1504   GFRNONAA >60 07/08/2022 0336   Assessment:   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with cirrhosis.  No significant varices on recent endoscopy s/p  prior banding. Esophageal ulcer. Duodenal and gastric ulcers, HP negative.  NSAIDs?   Plan:   No asa/nsaids unless absolutely clinically critical. Pantoprazole 40 mg po bid x 8 weeks then 40 mg po qd thereafter indefinitely. Start ursodiol 300 mg po bid (~ 13 mg/kg/day) for PBC. Advance diet.  OK d/c home; we will arrange outpatient follow-up with Dr. Levora Angel.; [facility] will sign-off; please call with questions; thank you for the consultation.   Freddy Jaksch 07/08/2022, 12:21 PM   Cell [phone] If no answer or after 5 PM call [phone]

## 2022-07-08 NOTE — Progress Notes (Signed)
PROGRESS NOTE   Hannah Blake  JEH:[id number] DOB: 1950-08-28 DOA: 07/05/2022 PCP: Rocky Morel, DO   Date of Service: the patient was seen and examined on 07/08/2022  Brief Narrative:  71 year old female with past medical history of hepatic cirrhosis (Dx 03/2021, thought to be multifactorial due to alcohol and possible primary biliary cholangitis), hospitalization 12/2021 for GI bleeding with EGD identifying esophageal varices and multiple gastric ulcers status post banding x 2.   Patient now presented to [facility] emergency department with complaints of hematemesis.  Upon evaluation in the emergency department patient was found to have a hemoglobin of 5, markedly elevated lactic acid of 7.4 and severe hypotension consistent with hemorrhagic shock.  PCCM was contacted and patient was admitted to the ICU.  Patient was initially placed on Levophed for vasopressor support.  Upon admission to the intensive care unit, patient was transfused a total of 4 units of packed red blood cells and was additionally given at least 4 L of isotonic fluids.  Patient was initiated on intravenous Protonix and octreotide as well as intravenous vitamin K due to elevated INR.  Patient was also placed on prophylactic ceftriaxone.    Dr. Levora Angel with gastroenterology was consulted and patient underwent EGD morning of 12/17 revealing linear esophageal ulcers with stigmata of recent bleeding in the distal esophagus, clotted blood throughout the stomach, 3 gastric ulcers with one found in the incisura and 2 found in the prepyloric region as well as several superficial duodenal ulcers.  Biopsies were obtained.  That day, patient was initiated on a clear liquid diet.  Patient has been weaned off of Levophed and the patient has now been transferred to the hospital service on 12/18.   Assessment and Plan: * Acute upper GI bleeding Patient underwent EGD 12/17 performed by Dr. Levora Angel revealing ulcerations in the  esophagus stomach and duodenum with blood clots noted throughout the stomach.  Esophageal ulcers in particular had stigmata of recent bleeding Octreotide discontinued on 12/18. Patient now on 40 mg of Protonix by mouth twice daily which GI recommends should be provided for 8 weeks followed by once daily. Patient has been receiving ceftriaxone for SBP prophylaxis Continue to monitor hemoglobin and hematocrit with serial CBCs Will transfuse for hemoglobin of less than 7 Nursing reports of bloody stool earlier this morning. Patient is apprehensive about her symptoms and therefore we will monitor overnight and likely discharge in the morning.   Esophageal varices in alcoholic cirrhosis (HCC) No evidence of persisting varices on EGD on 12/17  Hepatic cirrhosis (HCC) Multifactorial secondary to alcohol abuse and concern for primary biliary cholangitis Reports continued abstinence of alcohol Supportive care Long-term poor prognosis Patient to follow-up closely as an outpatient with [facility] gastroenterology after discharge  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(HCC) GI recommending initiation of ursodiol 300 mg twice daily. Per GI, patient to follow-up with Dr. Levora Angel in the neck several weeks after discharge.  Iron deficiency anemia due to chronic blood loss Iron panel consistent with iron deficiency Administering iron infusion Patient may additionally benefit from daily oral iron after discharge  Chronic systolic CHF (congestive heart failure) (HCC) No clinical evidence of cardiogenic volume overload Strict input and output monitoring Daily weights Low-sodium diet   GERD (gastroesophageal reflux disease) On twice daily PPI as noted above.    Subjective:  Patient states that both her low back pain that was quite severe last night and her lower abdominal pain have both improved dramatically.  According to nursing patient did have 1  bloody bowel movement early this morning.  Physical  Exam:  Vitals:   07/08/22 1434 07/08/22 1600 07/08/22 1900 07/08/22 2000  BP:    133/69  Pulse: 70  76 72  Resp: 10  13 13   Temp:  98 F (36.7 C)  98.2 F (36.8 C)  TempSrc:  Oral  Oral  SpO2: 95%  96% 97%  Weight:      Height:        Constitutional: Awake alert and oriented x3, no associated distress.  Thin build. Skin: no rashes, no lesions, poor skin turgor noted. Eyes: Pupils are equally reactive to light.  No evidence of scleral icterus or conjunctival pallor.  ENMT: Moist mucous membranes noted.  Posterior pharynx clear of any exudate or lesions.   Respiratory: clear to auscultation bilaterally, no wheezing, no crackles. Normal respiratory effort. No accessory muscle use.  Cardiovascular: Regular rate and rhythm, no murmurs / rubs / gallops. No extremity edema. 2+ pedal pulses. No carotid bruits.  Abdomen: Slightly protuberant abdomen.  Abdomen is soft.  Diffuse mild tenderness.  No evidence of intra-abdominal masses.  Positive bowel sounds noted in all quadrants.   Musculoskeletal: No joint deformity upper and lower extremities. Good ROM, no contractures. Poor muscle tone.    Data Reviewed:  I have personally reviewed and interpreted labs, imaging.  Significant findings are   CBC: Recent Labs  Lab 07/06/22 0319 07/06/22 1331 07/06/22 1830 07/07/22 0328 07/07/22 1927 07/08/22 0336  WBC 24.5* 9.2  --  7.4 8.5 7.9  NEUTROABS  --   --   --   --   --  4.7  HGB 6.9* 7.2* 8.4* 8.2* 8.7* 8.4*  HCT 21.8* 21.8* 25.3* 24.3* 27.1* 26.8*  MCV 84.2 84.5  --  85.6 88.0 88.7  PLT 205 135*  --  141* 147* 144*   Basic Metabolic Panel: Recent Labs  Lab 07/05/22 2056 07/06/22 0319 07/07/22 0328 07/08/22 0336  NA 136  --  136 135  K 4.7  --  4.6 4.0  CL 105  --  111 107  CO2 17*  --  23 24  GLUCOSE 142*  --  100* 94  BUN 39*  --  38* 23  CREATININE 1.15*  --  0.83 0.79  CALCIUM 8.4*  --  7.9* 7.9*  MG  --  1.9 2.0 1.8  PHOS  --  5.6* 2.2* 2.7   GFR: Estimated  Creatinine Clearance: 43.4 mL/min (by C-G formula based on SCr of 0.79 mg/dL). Liver Function Tests: Recent Labs  Lab 07/05/22 2056 07/08/22 0336  AST 117* 94*  ALT 44 51*  ALKPHOS 323* 276*  BILITOT 2.5* 2.4*  PROT 6.5 6.2*  ALBUMIN 1.8* 1.7*    Coagulation Profile: Recent Labs  Lab 07/05/22 2129  INR 1.5*      Code Status:  Full code.  Code status decision has been confirmed with: patient    Severity of Illness:  The appropriate patient status for this patient is INPATIENT. Inpatient status is judged to be reasonable and necessary in order to provide the required intensity of service to ensure the patient's safety. The patient's presenting symptoms, physical exam findings, and initial radiographic and laboratory data in the context of their chronic comorbidities is felt to place them at high risk for further clinical deterioration. Furthermore, it is not anticipated that the patient will be medically stable for discharge from the hospital within 2 midnights of admission.   * I certify that at the point  of admission it is my clinical judgment that the patient will require inpatient hospital care spanning beyond 2 midnights from the point of admission due to high intensity of service, high risk for further deterioration and high frequency of surveillance required.*  Time spent:  50 minutes  Author:  Marinda Elk MD  07/08/2022 9:54 PM

## 2022-07-08 NOTE — TOC Progression Note (Signed)
Transition of Care [facility]) - Progression Note    Patient Details  Name: Hannah Blake MRN: [id number] Date of Birth: 1950/08/28  Transition of Care [facility]) CM/SW Contact  Coralyn Helling, [place] Phone Number: 07/08/2022, 8:58 AM  Clinical Narrative:      Transition of Care [facility]) Screening Note   Patient Details  Name: Hannah Blake Date of Birth: Apr 12, 1951   Transition of Care [facility]) CM/SW Contact:    Coralyn Helling, LCSW Phone Number: 07/08/2022, 8:58 AM    Transition of Care Department [facility]) has reviewed patient and no TOC needs have been identified at this time. We will continue to monitor patient advancement through interdisciplinary progression rounds. If new patient transition needs arise, please place a TOC consult.         Expected Discharge Plan and Services                                                 Social Determinants of Health (SDOH) Interventions    Readmission Risk Interventions     No data to display

## 2022-07-08 NOTE — Progress Notes (Signed)
CCM Pharmacy Monitoring: Pharmacy may replace electrolytes per Elink protocol  Today, 07/08/22 Potassium 4.0 Phosphorus 2.7 Magnesium 1.8   Plan, Magnesium 2 g IV x 1 BMET and magnesium with am labs   Thank you for allowing pharmacy to be a part of this patient's care.  Selinda Eon, PharmD, BCPS Clinical Pharmacist [place] Please utilize Amion for appropriate phone number to reach the unit pharmacist [facility]) 07/08/2022 7:26 AM

## 2022-07-08 NOTE — Assessment & Plan Note (Signed)
GI recommending initiation of ursodiol 300 mg twice daily. Per GI, patient to follow-up with Dr. Levora Angel in the neck several weeks after discharge.

## 2022-07-09 LAB — CBC WITH DIFFERENTIAL/PLATELET
Abs Immature Granulocytes: 0.02 10*3/uL (ref 0.00–0.07)
Basophils Absolute: 0 10*3/uL (ref 0.0–0.1)
Basophils Relative: 0 %
Eosinophils Absolute: 0.4 10*3/uL (ref 0.0–0.5)
Eosinophils Relative: 4 %
HCT: 26.9 % — ABNORMAL LOW (ref 36.0–46.0)
Hemoglobin: 8.3 g/dL — ABNORMAL LOW (ref 12.0–15.0)
Immature Granulocytes: 0 %
Lymphocytes Relative: 17 %
Lymphs Abs: 1.5 10*3/uL (ref 0.7–4.0)
MCH: 28.2 pg (ref 26.0–34.0)
MCHC: 30.9 g/dL (ref 30.0–36.0)
MCV: 91.5 fL (ref 80.0–100.0)
Monocytes Absolute: 0.9 10*3/uL (ref 0.1–1.0)
Monocytes Relative: 11 %
Neutro Abs: 5.8 10*3/uL (ref 1.7–7.7)
Neutrophils Relative %: 68 %
Platelets: 163 10*3/uL (ref 150–400)
RBC: 2.94 MIL/uL — ABNORMAL LOW (ref 3.87–5.11)
RDW: 17.3 % — ABNORMAL HIGH (ref 11.5–15.5)
WBC: 8.6 10*3/uL (ref 4.0–10.5)
nRBC: 0.2 % (ref 0.0–0.2)

## 2022-07-09 LAB — BASIC METABOLIC PANEL
Anion gap: 9 (ref 5–15)
BUN: 16 mg/dL (ref 8–23)
CO2: 24 mmol/L (ref 22–32)
Calcium: 8 mg/dL — ABNORMAL LOW (ref 8.9–10.3)
Chloride: 102 mmol/L (ref 98–111)
Creatinine, Ser: 1 mg/dL (ref 0.44–1.00)
GFR, Estimated: 60 mL/min — ABNORMAL LOW (ref >60)
Glucose, Bld: 97 mg/dL (ref 70–99)
Potassium: 4.1 mmol/L (ref 3.5–5.1)
Sodium: 135 mmol/L (ref 135–145)

## 2022-07-09 LAB — MAGNESIUM: Magnesium: 2 mg/dL (ref 1.7–2.4)

## 2022-07-09 MED ORDER — INFLUENZA VAC A&B SA ADJ QUAD 0.5 ML IM PRSY
0.5000 mL | PREFILLED_SYRINGE | INTRAMUSCULAR | Status: AC
  Administered 2022-07-09: 0.5 mL via INTRAMUSCULAR
  Filled 2022-07-09: qty 0.5

## 2022-07-09 MED ORDER — PANTOPRAZOLE SODIUM 40 MG PO TBEC: 40.0000 mg | DELAYED_RELEASE_TABLET | Freq: Two times a day (BID) | ORAL | 0 refills | Status: DC

## 2022-07-09 MED ORDER — CIPROFLOXACIN HCL 500 MG PO TABS: 500.0000 mg | ORAL_TABLET | Freq: Two times a day (BID) | ORAL | 0 refills | Status: DC

## 2022-07-09 MED ORDER — URSODIOL 300 MG PO CAPS: 600.0000 mg | ORAL_CAPSULE | Freq: Two times a day (BID) | ORAL | 3 refills | Status: DC

## 2022-07-09 MED ORDER — SPIRONOLACTONE 50 MG PO TABS: 50.0000 mg | ORAL_TABLET | ORAL | 0 refills | Status: DC

## 2022-07-09 MED ORDER — FUROSEMIDE 20 MG PO TABS: 20.0000 mg | ORAL_TABLET | ORAL | 0 refills | Status: DC

## 2022-07-09 NOTE — Discharge Summary (Signed)
Discharge Summary  Hannah Blake:[id number] DOB: [birth year]  PCP: Johny Blamer, DO  Admit date: 07/05/2022 Discharge date: 07/09/2022   Time spent: 62mns, more than 50% time spent on coordination of care. Case discussed with [name] Dr oRoosvelt Maserprior to discharge Plan of care discussed with patient grandson over the phone  Recommendations for Outpatient Follow-up:  F/u with PCP within a week  for hospital discharge follow up, repeat cbc/bmp /liver function at follow up, pcp  to monitor iron level F/u with [name] Dr BAlessandra Bevels      Discharge Diagnoses:  Active Hospital Problems   Diagnosis Date Noted   Acute upper GI bleeding 07/05/2022    Priority: 1.   Esophageal varices in alcoholic cirrhosis ([facility] 008/81/1031   Priority: 2.   Hepatic cirrhosis ([facility] 01/13/2022    Priority: 3.   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([facility] 05/02/2022    Priority: 4.   Iron deficiency anemia due to chronic blood loss 07/07/2022    Priority: 5.   Chronic systolic CHF (congestive heart failure) ([facility] 07/07/2022    Priority: 6.   GERD (gastroesophageal reflux disease) 05/02/2022    Priority: 7.    Resolved Hospital Problems  No resolved problems to display.    Discharge Condition: stable  Diet recommendation: soft diet   Filed Weights   07/05/22 2013 07/06/22 1034 07/08/22 0500  Weight: 47 kg 47 kg 45.7 kg    History of present illness: ( per icu admitting MD Dr DErin Fulling  KMeredeth Ideis a 71year old woman with cirrhosis, esophageal varices s/p banding 01/2022, coronary artery disease and HFpEF [facility]. She continued to vomit two more times since being in the ED of dark blood. She has been in her normal state per grandson at the bedside until today. She complains of dizziness and feeling cold.    In the ER, hemoglobin is 5g/dL, Cr 1.15 (baseline 0.6), serum bicarb 17, Alk phos 323, albumin 1.8, T. Bili 2.5, Lactic Acid 7.4 and WBC count of 18.8.  INR 1.5.   PCCM called to admit for GI bleed and shock. GI has been consulted with current plan for EGD tomorrow morning.   Hospital Course:  Principal Problem:   Acute upper GI bleeding Active Problems:   Esophageal varices in alcoholic cirrhosis (HCC)   Hepatic cirrhosis (HCC)   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(HCC)   Iron deficiency anemia due to chronic blood loss   Chronic systolic CHF (congestive heart failure) (HCC)   GERD (gastroesophageal reflux disease)   Assessment and Plan:    * Acute upper GI bleeding/acute blood loss anemia Patient underwent EGD 12/17 /2023 performed by Dr. BAlessandra Bevelsrevealing ulcerations in the esophagus stomach and duodenum with blood clots noted throughout the stomach.  Esophageal ulcers in particular had stigmata of recent bleeding Octreotide discontinued on 12/18, no significant varices on EGD from 07/06/2022 GI recommends  40 mg of Protonix by mouth twice daily for 8 weeks followed by once daily. Patient has been receiving ceftriaxone for SBP prophylaxis which has now been transition to oral ciprofloxacin (to not interact with ursodiol) and should be completed for a total course of 7 days of antibiotics. She does not need long term abx prophylaxis per GI -she received total of 4units prbc transfusion and iv iron, hgb has been stable around 8 -she reports no more active bleeding, no n/v, no ab pain, she desires to go home, she is advised to follow up  with pcp and GI closely    Esophageal varices in alcoholic cirrhosis (HCC) No evidence of persisting varices on EGD on 12/17  Hepatic cirrhosis (HCC) with h/o ascites  Multifactorial secondary to alcohol abuse and concern for primary biliary cholangitis Reports continued abstinence of alcohol Supportive care Long-term poor prognosis Patient to follow-up closely as an outpatient with [facility] gastroenterology after discharge  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([place]) GI recommending initiation of ursodiol 300  mg twice daily. Per GI, patient to follow-up with Dr. Alessandra Bevels .    Chronic systolic CHF (congestive heart failure) (HCC) No clinical evidence of cardiogenic volume overload Strict input and output monitoring Daily weights Low-sodium diet  Discharge Exam: BP (!) 102/56 (BP Location: Right Arm)   Pulse 80   Temp 98 F (36.7 C) (Oral)   Resp 16   Ht _0  (1.499 m)   Wt 45.7 kg   SpO2 96%   BMI 20.35 kg/m   General: NAD Cardiovascular: RRR Respiratory: normal respiratory effort     Discharge Instructions     Diet general   Complete by: As directed    Soft diet   Increase activity slowly   Complete by: As directed       Allergies as of 07/09/2022   No Known Allergies      Medication List     TAKE these medications    ciprofloxacin 500 MG tablet Commonly known as: CIPRO Take 1 tablet (500 mg total) by mouth 2 (two) times daily for 2 days.   furosemide 20 MG tablet Commonly known as: LASIX Take 1 tablet (20 mg total) by mouth every Monday, Wednesday, and Friday. What changed: when to take this   pantoprazole 40 MG tablet Commonly known as: Protonix Take 1 tablet (40 mg total) by mouth 2 (two) times daily before a meal. What changed: when to take this   spironolactone 50 MG tablet Commonly known as: ALDACTONE Take 1 tablet (50 mg total) by mouth every Monday, Wednesday, and Friday. What changed: when to take this   ursodiol 300 MG capsule Commonly known as: ACTIGALL Take 2 capsules (600 mg total) by mouth 2 (two) times daily. What changed: Another medication with the same name was removed. Continue taking this medication, and follow the directions you see here.       No Known Allergies  Follow-up Information     Johny Blamer, DO Follow up in 1 week(s).   Why: hospital discharge follow up, repeat basic labs including cbc/bmp/liver function at follow up. pcp to monitor iron level Contact information: [address] [place]  [address] [phone]         Otis Brace, MD Follow up in 1 month(s).   Specialty: Gastroenterology Why: for gi bleed and cirrhosis Contact information: [place] [address] [phone]                  The results of significant diagnostics from this hospitalization (including imaging, microbiology, ancillary and laboratory) are listed below for reference.    Significant Diagnostic Studies: DG Chest Port 1 View  Result Date: 07/05/2022 CLINICAL DATA:  Unstable GI bleed EXAM: PORTABLE CHEST 1 VIEW COMPARISON:  01/12/2022 FINDINGS: Lungs are essentially clear.  No pleural effusion or pneumothorax. The heart is normal in size. IMPRESSION: No evidence of acute cardiopulmonary disease. Electronically Signed   By: Julian Hy M.D.   On: 07/05/2022 21:24    Microbiology: Recent Results (from the past 240 hour(s))  Culture, blood (Routine x 2)     Status: None (Preliminary result)   Collection Time: 07/05/22  8:56 PM   Specimen: BLOOD RIGHT FOREARM  Result Value Ref Range Status   Specimen Description   Final    BLOOD RIGHT FOREARM Performed at [facility], [place] [address], [place], [place] [address]    Special Requests   Final    BOTTLES DRAWN AEROBIC AND ANAEROBIC Blood Culture results may not be optimal due to an inadequate volume of blood received in culture bottles Performed at [place] [facility] [address], [place], [place] [address]    Culture   Final    NO GROWTH 4 DAYS Performed at [facility], [place] [address], [place], [place] [address]    Report Status PENDING  Incomplete  Resp panel by RT-PCR (RSV, Flu A&B, Covid) Anterior Nasal Swab     Status: None   Collection Time: 07/06/22 12:59 AM   Specimen: Anterior Nasal Swab  Result Value Ref Range Status   SARS Coronavirus 2 by RT PCR NEGATIVE NEGATIVE Final    Comment: (NOTE) SARS-CoV-2 target nucleic acids are NOT DETECTED.  The SARS-CoV-2  RNA is generally detectable in upper respiratory specimens during the acute phase of infection. The lowest concentration of SARS-CoV-2 viral copies this assay can detect is 138 copies/mL. A negative result does not preclude SARS-Cov-2 infection and should not be used as the sole basis for treatment or other patient management decisions. A negative result may occur with  improper specimen collection/handling, submission of specimen other than nasopharyngeal swab, presence of viral mutation(s) within the areas targeted by this assay, and inadequate number of viral copies(<138 copies/mL). A negative result must be combined with clinical observations, patient history, and epidemiological information. The expected result is Negative.  Fact Sheet for Patients:  EntrepreneurPulse.com.au  Fact Sheet for Healthcare Providers:  IncredibleEmployment.be  This test is no t yet approved or cleared by the [place] FDA and  has been authorized for detection and/or diagnosis of SARS-CoV-2 by FDA under an Emergency Use Authorization (EUA). This EUA will remain  in effect (meaning this test can be used) for the duration of the COVID-19 declaration under Section 564(b)(1) of the Act, 21 U.S.C.section 360bbb-3(b)(1), unless the authorization is terminated  or revoked sooner.       Influenza A by PCR NEGATIVE NEGATIVE Final   Influenza B by PCR NEGATIVE NEGATIVE Final    Comment: (NOTE) The Xpert Xpress SARS-CoV-2/FLU/RSV plus assay is intended as an aid in the diagnosis of influenza from Nasopharyngeal swab specimens and should not be used as a sole basis for treatment. Nasal washings and aspirates are unacceptable for Xpert Xpress SARS-CoV-2/FLU/RSV testing.  Fact Sheet for Patients: EntrepreneurPulse.com.au  Fact Sheet for Healthcare Providers: IncredibleEmployment.be  This test is not yet approved or cleared by the  [place] FDA and has been authorized for detection and/or diagnosis of SARS-CoV-2 by FDA under an Emergency Use Authorization (EUA). This EUA will remain in effect (meaning this test can be used) for the duration of the COVID-19 declaration under Section 564(b)(1) of the Act, 21 U.S.C. section 360bbb-3(b)(1), unless the authorization is terminated or revoked.     Resp Syncytial Virus by PCR NEGATIVE NEGATIVE Final    Comment: (NOTE) Fact Sheet for Patients: EntrepreneurPulse.com.au  Fact Sheet for Healthcare Providers: IncredibleEmployment.be  This test is not yet approved or cleared by the [place] FDA and has been authorized for detection and/or diagnosis  of SARS-CoV-2 by FDA under an Emergency Use Authorization (EUA). This EUA will remain in effect (meaning this test can be used) for the duration of the COVID-19 declaration under Section 564(b)(1) of the Act, 21 U.S.C. section 360bbb-3(b)(1), unless the authorization is terminated or revoked.  Performed at [facility], [place] [address], [place], [place] [address]   MRSA Next Gen by PCR, Nasal     Status: None   Collection Time: 07/06/22 12:59 AM   Specimen: Anterior Nasal Swab  Result Value Ref Range Status   MRSA by PCR Next Gen NOT DETECTED NOT DETECTED Final    Comment: (NOTE) The GeneXpert MRSA Assay (FDA approved for NASAL specimens only), is one component of a comprehensive MRSA colonization surveillance program. It is not intended to diagnose MRSA infection nor to guide or monitor treatment for MRSA infections. Test performance is not FDA approved in patients less than 25 years old. Performed at [facility], [place] [address], [place], [place] [address]   Culture, blood (Routine x 2)     Status: None (Preliminary result)   Collection Time: 07/06/22  3:19 AM   Specimen: BLOOD RIGHT ARM  Result Value Ref Range Status   Specimen  Description   Final    BLOOD RIGHT ARM Performed at [facility] [address] [place], [place] [address]    Special Requests   Final    BOTTLES DRAWN AEROBIC ONLY Blood Culture adequate volume Performed at [place] [address] [place], [place] [address]    Culture   Final    NO GROWTH 3 DAYS Performed at [facility], [place] [address] [place], [place] [address]    Report Status PENDING  Incomplete     Labs: Basic Metabolic Panel: Recent Labs  Lab 07/05/22 2056 07/06/22 0319 07/07/22 0328 07/08/22 0336 07/09/22 0304  NA 136  --  136 135 135  K 4.7  --  4.6 4.0 4.1  CL 105  --  111 107 102  CO2 17*  --  _0 GLUCOSE 142*  --  100* 94 97  BUN 39*  --  38* 23 16  CREATININE 1.15*  --  0.83 0.79 1.00  CALCIUM 8.4*  --  7.9* 7.9* 8.0*  MG  --  1.9 2.0 1.8 2.0  PHOS  --  5.6* 2.2* 2.7  --    Liver Function Tests: Recent Labs  Lab 07/05/22 2056 07/08/22 0336  AST 117* 94*  ALT 44 51*  ALKPHOS 323* 276*  BILITOT 2.5* 2.4*  PROT 6.5 6.2*  ALBUMIN 1.8* 1.7*   No results for input(s): "LIPASE", "AMYLASE" in the last 168 hours. No results for input(s): "AMMONIA" in the last 168 hours. CBC: Recent Labs  Lab 07/06/22 1331 07/06/22 1830 07/07/22 0328 07/07/22 1927 07/08/22 0336 07/09/22 0304  WBC 9.2  --  7.4 8.5 7.9 8.6  NEUTROABS  --   --   --   --  4.7 5.8  HGB 7.2* 8.4* 8.2* 8.7* 8.4* 8.3*  HCT 21.8* 25.3* 24.3* 27.1* 26.8* 26.9*  MCV 84.5  --  85.6 88.0 88.7 91.5  PLT 135*  --  141* 147* 144* 163   Cardiac Enzymes: No results for input(s): "CKTOTAL", "CKMB", "CKMBINDEX", "TROPONINI" in the last 168 hours. BNP: BNP (last 3 results) No results for input(s): "BNP" in the last 8760 hours.  ProBNP (last 3 results) No results for input(s): "PROBNP" in the last  8760 hours.  CBG: No results for input(s): "GLUCAP" in the last 168 hours.  FURTHER DISCHARGE INSTRUCTIONS:   Get Medicines reviewed and  adjusted: Please take all your medications with you for your next visit with your Primary MD   Laboratory/radiological data: Please request your Primary MD to go over all hospital tests and procedure/radiological results at the follow up, please ask your Primary MD to get all Hospital records sent to his/her office.   In some cases, they will be blood work, cultures and biopsy results pending at the time of your discharge. Please request that your primary care M.D. goes through all the records of your hospital data and follows up on these results.   Also Note the following: If you experience worsening of your admission symptoms, develop shortness of breath, life threatening emergency, suicidal or homicidal thoughts you must seek medical attention immediately by calling 911 or calling your MD immediately  if symptoms less severe.   You must read complete instructions/literature along with all the possible adverse reactions/side effects for all the Medicines you take and that have been prescribed to you. Take any new Medicines after you have completely understood and accpet all the possible adverse reactions/side effects.    Do not drive when taking Pain medications or sleeping medications (Benzodaizepines)   Do not take more than prescribed Pain, Sleep and Anxiety Medications. It is not advisable to combine anxiety,sleep and pain medications without talking with your primary care practitioner   Special Instructions: If you have smoked or chewed Tobacco  in the last 2 yrs please stop smoking, stop any regular Alcohol  and or any Recreational drug use.   Wear Seat belts while driving.   Please note: You were cared for by a hospitalist during your hospital stay. Once you are discharged, your primary care physician will handle any further medical issues. Please note that NO REFILLS for any discharge medications will be authorized once you are discharged, as it is imperative that you return to your  primary care physician (or establish a relationship with a primary care physician if you do not have one) for your post hospital discharge needs so that they can reassess your need for medications and monitor your lab values.     Signed:  Florencia Reasons MD, PhD, FACP  [facility] 07/09/2022, 10:12 AM

## 2022-07-09 NOTE — Progress Notes (Signed)
AVS instructions reviewed with patient and her grandson [name] per orders. All questions answered at this time. Education and care plan completed. All patient belongings returned to patient. PIVs removed. Patient was taken down to main entrance by NT via wheelchair. Patient's grandson drove her home.

## 2022-07-10 ENCOUNTER — Other Ambulatory Visit: Payer: Self-pay

## 2022-07-10 ENCOUNTER — Other Ambulatory Visit (HOSPITAL_COMMUNITY): Payer: Self-pay

## 2022-07-10 ENCOUNTER — Other Ambulatory Visit: Payer: Self-pay | Admitting: *Deleted

## 2022-07-10 ENCOUNTER — Encounter (HOSPITAL_COMMUNITY): Payer: Self-pay

## 2022-07-10 ENCOUNTER — Emergency Department (HOSPITAL_COMMUNITY): Payer: Medicare Other

## 2022-07-10 ENCOUNTER — Telehealth: Payer: Self-pay

## 2022-07-10 ENCOUNTER — Encounter (HOSPITAL_COMMUNITY)
Admission: EM | Disposition: A | Discharge status: Home Health | Payer: Self-pay | Source: Home / Self Care | Attending: Pulmonary Disease

## 2022-07-10 ENCOUNTER — Inpatient Hospital Stay (HOSPITAL_COMMUNITY)
Admission: EM | Admit: 2022-07-10 | Discharge: 2022-07-21 | DRG: 981 | Disposition: A | Discharge status: Home Health | Payer: Medicare Other | Attending: Internal Medicine | Admitting: Internal Medicine

## 2022-07-10 ENCOUNTER — Inpatient Hospital Stay (HOSPITAL_COMMUNITY): Payer: Medicare Other

## 2022-07-10 DIAGNOSIS — K269 Duodenal ulcer, unspecified as acute or chronic, without hemorrhage or perforation: Secondary | ICD-10-CM | POA: Diagnosis present

## 2022-07-10 DIAGNOSIS — F1010 Alcohol abuse, uncomplicated: Secondary | ICD-10-CM | POA: Diagnosis present

## 2022-07-10 DIAGNOSIS — I5042 Chronic combined systolic (congestive) and diastolic (congestive) heart failure: Secondary | ICD-10-CM | POA: Diagnosis present

## 2022-07-10 DIAGNOSIS — K219 Gastro-esophageal reflux disease without esophagitis: Secondary | ICD-10-CM | POA: Diagnosis present

## 2022-07-10 DIAGNOSIS — R188 Other ascites: Secondary | ICD-10-CM | POA: Diagnosis present

## 2022-07-10 DIAGNOSIS — G9341 Metabolic encephalopathy: Secondary | ICD-10-CM | POA: Diagnosis present

## 2022-07-10 DIAGNOSIS — Z781 Physical restraint status: Secondary | ICD-10-CM

## 2022-07-10 DIAGNOSIS — K921 Melena: Secondary | ICD-10-CM | POA: Diagnosis present

## 2022-07-10 DIAGNOSIS — Z79899 Other long term (current) drug therapy: Secondary | ICD-10-CM | POA: Diagnosis not present

## 2022-07-10 DIAGNOSIS — D62 Acute posthemorrhagic anemia: Secondary | ICD-10-CM | POA: Diagnosis present

## 2022-07-10 DIAGNOSIS — F17210 Nicotine dependence, cigarettes, uncomplicated: Secondary | ICD-10-CM | POA: Diagnosis present

## 2022-07-10 DIAGNOSIS — D649 Anemia, unspecified: Principal | ICD-10-CM

## 2022-07-10 DIAGNOSIS — K3189 Other diseases of stomach and duodenum: Secondary | ICD-10-CM | POA: Diagnosis present

## 2022-07-10 DIAGNOSIS — I2510 Atherosclerotic heart disease of native coronary artery without angina pectoris: Secondary | ICD-10-CM | POA: Diagnosis present

## 2022-07-10 DIAGNOSIS — E876 Hypokalemia: Secondary | ICD-10-CM | POA: Diagnosis not present

## 2022-07-10 DIAGNOSIS — R571 Hypovolemic shock: Secondary | ICD-10-CM | POA: Diagnosis not present

## 2022-07-10 DIAGNOSIS — I8510 Secondary esophageal varices without bleeding: Secondary | ICD-10-CM | POA: Diagnosis present

## 2022-07-10 DIAGNOSIS — J9601 Acute respiratory failure with hypoxia: Secondary | ICD-10-CM | POA: Diagnosis present

## 2022-07-10 DIAGNOSIS — K254 Chronic or unspecified gastric ulcer with hemorrhage: Secondary | ICD-10-CM | POA: Diagnosis present

## 2022-07-10 DIAGNOSIS — K7460 Unspecified cirrhosis of liver: Secondary | ICD-10-CM

## 2022-07-10 DIAGNOSIS — K743 Primary biliary cirrhosis: Secondary | ICD-10-CM | POA: Diagnosis present

## 2022-07-10 DIAGNOSIS — K766 Portal hypertension: Secondary | ICD-10-CM | POA: Diagnosis present

## 2022-07-10 DIAGNOSIS — R578 Other shock: Secondary | ICD-10-CM | POA: Diagnosis present

## 2022-07-10 HISTORY — PX: ESOPHAGOGASTRODUODENOSCOPY (EGD) WITH PROPOFOL: SHX5813

## 2022-07-10 LAB — CBC
HCT: 27.9 % — ABNORMAL LOW (ref 36.0–46.0)
Hemoglobin: 9.5 g/dL — ABNORMAL LOW (ref 12.0–15.0)
MCH: 31 pg (ref 26.0–34.0)
MCHC: 34.1 g/dL (ref 30.0–36.0)
MCV: 91.2 fL (ref 80.0–100.0)
Platelets: 130 10*3/uL — ABNORMAL LOW (ref 150–400)
RBC: 3.06 MIL/uL — ABNORMAL LOW (ref 3.87–5.11)
RDW: 15 % (ref 11.5–15.5)
WBC: 35.1 10*3/uL — ABNORMAL HIGH (ref 4.0–10.5)
nRBC: 0.4 % — ABNORMAL HIGH (ref 0.0–0.2)

## 2022-07-10 LAB — DIC (DISSEMINATED INTRAVASCULAR COAGULATION)PANEL
D-Dimer, Quant: 6.56 ug/mL-FEU — ABNORMAL HIGH (ref 0.00–0.50)
Fibrinogen: 181 mg/dL — ABNORMAL LOW (ref 210–475)
INR: 1.4 — ABNORMAL HIGH (ref 0.8–1.2)
Platelets: 128 10*3/uL — ABNORMAL LOW (ref 150–400)
Prothrombin Time: 17.1 seconds — ABNORMAL HIGH (ref 11.4–15.2)
Smear Review: NONE SEEN
aPTT: 38 seconds — ABNORMAL HIGH (ref 24–36)

## 2022-07-10 LAB — COMPREHENSIVE METABOLIC PANEL
ALT: 39 U/L (ref 0–44)
AST: 83 U/L — ABNORMAL HIGH (ref 15–41)
Albumin: 1.9 g/dL — ABNORMAL LOW (ref 3.5–5.0)
Alkaline Phosphatase: 171 U/L — ABNORMAL HIGH (ref 38–126)
Anion gap: 11 (ref 5–15)
BUN: 18 mg/dL (ref 8–23)
CO2: 20 mmol/L — ABNORMAL LOW (ref 22–32)
Calcium: 7 mg/dL — ABNORMAL LOW (ref 8.9–10.3)
Chloride: 107 mmol/L (ref 98–111)
Creatinine, Ser: 1.02 mg/dL — ABNORMAL HIGH (ref 0.44–1.00)
GFR, Estimated: 58 mL/min — ABNORMAL LOW (ref >60)
Glucose, Bld: 133 mg/dL — ABNORMAL HIGH (ref 70–99)
Potassium: 4.3 mmol/L (ref 3.5–5.1)
Sodium: 138 mmol/L (ref 135–145)
Total Bilirubin: 3.1 mg/dL — ABNORMAL HIGH (ref 0.3–1.2)
Total Protein: 5 g/dL — ABNORMAL LOW (ref 6.5–8.1)

## 2022-07-10 LAB — CBC WITH DIFFERENTIAL/PLATELET
Abs Immature Granulocytes: 1.85 10*3/uL — ABNORMAL HIGH (ref 0.00–0.07)
Basophils Absolute: 0.1 10*3/uL (ref 0.0–0.1)
Basophils Relative: 0 %
Eosinophils Absolute: 0.1 10*3/uL (ref 0.0–0.5)
Eosinophils Relative: 0 %
HCT: 17.1 % — ABNORMAL LOW (ref 36.0–46.0)
Hemoglobin: 4.9 g/dL — CL (ref 12.0–15.0)
Immature Granulocytes: 5 %
Lymphocytes Relative: 5 %
Lymphs Abs: 1.8 10*3/uL (ref 0.7–4.0)
MCH: 29.7 pg (ref 26.0–34.0)
MCHC: 28.7 g/dL — ABNORMAL LOW (ref 30.0–36.0)
MCV: 103.6 fL — ABNORMAL HIGH (ref 80.0–100.0)
Monocytes Absolute: 1.8 10*3/uL — ABNORMAL HIGH (ref 0.1–1.0)
Monocytes Relative: 5 %
Neutro Abs: 28.6 10*3/uL — ABNORMAL HIGH (ref 1.7–7.7)
Neutrophils Relative %: 85 %
Platelets: 197 10*3/uL (ref 150–400)
RBC: 1.65 MIL/uL — ABNORMAL LOW (ref 3.87–5.11)
RDW: 19.3 % — ABNORMAL HIGH (ref 11.5–15.5)
WBC: 34.2 10*3/uL — ABNORMAL HIGH (ref 4.0–10.5)
nRBC: 1.4 % — ABNORMAL HIGH (ref 0.0–0.2)

## 2022-07-10 LAB — CULTURE, BLOOD (ROUTINE X 2): Culture: NO GROWTH

## 2022-07-10 LAB — BRAIN NATRIURETIC PEPTIDE: B Natriuretic Peptide: 86.9 pg/mL (ref 0.0–100.0)

## 2022-07-10 LAB — MASSIVE TRANSFUSION PROTOCOL ORDER (BLOOD BANK NOTIFICATION)

## 2022-07-10 LAB — ABO/RH: ABO/RH(D): B POS

## 2022-07-10 LAB — LACTIC ACID, PLASMA: Lactic Acid, Venous: 4.4 mmol/L (ref 0.5–1.9)

## 2022-07-10 LAB — LIPASE, BLOOD: Lipase: 82 U/L — ABNORMAL HIGH (ref 11–51)

## 2022-07-10 LAB — TROPONIN I (HIGH SENSITIVITY): Troponin I (High Sensitivity): 10 ng/L (ref <18)

## 2022-07-10 SURGERY — ESOPHAGOGASTRODUODENOSCOPY (EGD) WITH PROPOFOL

## 2022-07-10 MED ORDER — NOREPINEPHRINE 4 MG/250ML-% IV SOLN: 0.0000 ug/min | INTRAVENOUS | Status: DC

## 2022-07-10 MED ORDER — ONDANSETRON HCL 4 MG/2ML IJ SOLN
4.0000 mg | Freq: Once | INTRAMUSCULAR | Status: AC
  Administered 2022-07-10: 4 mg via INTRAVENOUS
  Filled 2022-07-10: qty 2

## 2022-07-10 MED ORDER — SODIUM CHLORIDE 0.9 % IV SOLN
50.0000 ug/h | INTRAVENOUS | Status: AC
  Administered 2022-07-10 – 2022-07-13 (×7): 50 ug/h via INTRAVENOUS
  Filled 2022-07-10 (×12): qty 1

## 2022-07-10 MED ORDER — DOCUSATE SODIUM 100 MG PO CAPS: 100.0000 mg | ORAL_CAPSULE | Freq: Two times a day (BID) | ORAL | Status: DC | PRN

## 2022-07-10 MED ORDER — SODIUM CHLORIDE 0.9 % IV SOLN
250.0000 mL | INTRAVENOUS | Status: DC
  Administered 2022-07-12 – 2022-07-15 (×2): 250 mL via INTRAVENOUS

## 2022-07-10 MED ORDER — FENTANYL CITRATE PF 50 MCG/ML IJ SOSY
25.0000 ug | PREFILLED_SYRINGE | INTRAMUSCULAR | Status: AC | PRN
  Administered 2022-07-11 – 2022-07-12 (×3): 25 ug via INTRAVENOUS
  Filled 2022-07-10 (×3): qty 1

## 2022-07-10 MED ORDER — MIDAZOLAM HCL 2 MG/2ML IJ SOLN
INTRAMUSCULAR | Status: AC
  Administered 2022-07-10: 2 mg
  Filled 2022-07-10: qty 2

## 2022-07-10 MED ORDER — FENTANYL CITRATE PF 50 MCG/ML IJ SOSY
25.0000 ug | PREFILLED_SYRINGE | INTRAMUSCULAR | Status: DC | PRN
  Administered 2022-07-11: 50 ug via INTRAVENOUS
  Administered 2022-07-12: 25 ug via INTRAVENOUS
  Administered 2022-07-13: 50 ug via INTRAVENOUS
  Administered 2022-07-13 – 2022-07-14 (×15): 100 ug via INTRAVENOUS
  Administered 2022-07-14: 50 ug via INTRAVENOUS
  Filled 2022-07-10 (×8): qty 2
  Filled 2022-07-10: qty 1
  Filled 2022-07-10: qty 2
  Filled 2022-07-10: qty 1
  Filled 2022-07-10 (×7): qty 2
  Filled 2022-07-10 (×3): qty 1

## 2022-07-10 MED ORDER — METOCLOPRAMIDE HCL 5 MG/ML IJ SOLN
10.0000 mg | INTRAMUSCULAR | Status: AC
  Administered 2022-07-10: 10 mg via INTRAVENOUS
  Filled 2022-07-10: qty 2

## 2022-07-10 MED ORDER — PROPOFOL 1000 MG/100ML IV EMUL
0.0000 ug/kg/min | INTRAVENOUS | Status: DC
  Administered 2022-07-11: 25 ug/kg/min via INTRAVENOUS
  Administered 2022-07-11: 10 ug/kg/min via INTRAVENOUS
  Administered 2022-07-12: 30 ug/kg/min via INTRAVENOUS
  Administered 2022-07-12: 40 ug/kg/min via INTRAVENOUS
  Administered 2022-07-12: 50 ug/kg/min via INTRAVENOUS
  Administered 2022-07-13: 30 ug/kg/min via INTRAVENOUS
  Filled 2022-07-10 (×7): qty 100

## 2022-07-10 MED ORDER — PANTOPRAZOLE SODIUM 40 MG IV SOLR
40.0000 mg | Freq: Two times a day (BID) | INTRAVENOUS | Status: DC
  Administered 2022-07-11 – 2022-07-18 (×15): 40 mg via INTRAVENOUS
  Filled 2022-07-10 (×15): qty 10

## 2022-07-10 MED ORDER — ETOMIDATE 2 MG/ML IV SOLN
INTRAVENOUS | Status: AC
  Administered 2022-07-10: 10 mg
  Filled 2022-07-10: qty 20

## 2022-07-10 MED ORDER — SODIUM CHLORIDE 0.9 % IV SOLN
2.0000 g | Freq: Every day | INTRAVENOUS | Status: DC
  Administered 2022-07-10 – 2022-07-17 (×8): 2 g via INTRAVENOUS
  Filled 2022-07-10 (×8): qty 20

## 2022-07-10 MED ORDER — SODIUM CHLORIDE 0.9% IV SOLUTION: Freq: Once | INTRAVENOUS | Status: AC

## 2022-07-10 MED ORDER — KETAMINE HCL 50 MG/5ML IJ SOSY
PREFILLED_SYRINGE | INTRAMUSCULAR | Status: AC
  Filled 2022-07-10: qty 10

## 2022-07-10 MED ORDER — DOCUSATE SODIUM 50 MG/5ML PO LIQD
100.0000 mg | Freq: Two times a day (BID) | ORAL | Status: DC
  Filled 2022-07-10: qty 10

## 2022-07-10 MED ORDER — SUCCINYLCHOLINE CHLORIDE 200 MG/10ML IV SOSY
PREFILLED_SYRINGE | INTRAVENOUS | Status: AC
  Administered 2022-07-10: 50 mg
  Filled 2022-07-10: qty 10

## 2022-07-10 MED ORDER — ROCURONIUM BROMIDE 10 MG/ML (PF) SYRINGE
PREFILLED_SYRINGE | INTRAVENOUS | Status: AC
  Filled 2022-07-10: qty 10

## 2022-07-10 MED ORDER — FENTANYL CITRATE PF 50 MCG/ML IJ SOSY
PREFILLED_SYRINGE | INTRAMUSCULAR | Status: AC
  Filled 2022-07-10: qty 2

## 2022-07-10 MED ORDER — PHENYLEPHRINE HCL-NACL 20-0.9 MG/250ML-% IV SOLN
0.0000 ug/min | INTRAVENOUS | Status: DC
  Administered 2022-07-10: 150 ug/min via INTRAVENOUS
  Administered 2022-07-11: 220 ug/min via INTRAVENOUS
  Administered 2022-07-11: 50 ug/min via INTRAVENOUS
  Administered 2022-07-11: 180 ug/min via INTRAVENOUS
  Filled 2022-07-10 (×3): qty 250

## 2022-07-10 MED ORDER — SODIUM CHLORIDE 0.9 % IV BOLUS
1000.0000 mL | Freq: Once | INTRAVENOUS | Status: AC
  Administered 2022-07-10: 1000 mL via INTRAVENOUS

## 2022-07-10 MED ORDER — VASOPRESSIN 20 UNITS/100 ML INFUSION FOR SHOCK
0.0300 [IU]/min | INTRAVENOUS | Status: DC
  Administered 2022-07-11 – 2022-07-15 (×11): 0.03 [IU]/min via INTRAVENOUS
  Filled 2022-07-10 (×11): qty 100

## 2022-07-10 MED ORDER — VITAMIN K1 10 MG/ML IJ SOLN
10.0000 mg | Freq: Once | INTRAVENOUS | Status: AC
  Administered 2022-07-10: 10 mg via INTRAVENOUS
  Filled 2022-07-10: qty 1

## 2022-07-10 MED ORDER — SODIUM CHLORIDE 0.9% IV SOLUTION: Freq: Once | INTRAVENOUS | Status: DC

## 2022-07-10 MED ORDER — SODIUM CHLORIDE 0.9 % IV SOLN
1000.0000 mg | INTRAVENOUS | Status: AC
  Administered 2022-07-10: 1000 mg via INTRAVENOUS
  Filled 2022-07-10: qty 20

## 2022-07-10 MED ORDER — CALCIUM GLUCONATE-NACL 2-0.675 GM/100ML-% IV SOLN
2.0000 g | INTRAVENOUS | Status: AC
  Administered 2022-07-10: 2000 mg via INTRAVENOUS
  Filled 2022-07-10: qty 100

## 2022-07-10 MED ORDER — OCTREOTIDE LOAD VIA INFUSION
50.0000 ug | Freq: Once | INTRAVENOUS | Status: AC
  Administered 2022-07-10: 50 ug via INTRAVENOUS
  Filled 2022-07-10: qty 25

## 2022-07-10 MED ORDER — POLYETHYLENE GLYCOL 3350 17 G PO PACK
17.0000 g | PACK | Freq: Every day | ORAL | Status: DC
  Filled 2022-07-10: qty 1

## 2022-07-10 MED ORDER — POLYETHYLENE GLYCOL 3350 17 G PO PACK: 17.0000 g | PACK | Freq: Every day | ORAL | Status: DC | PRN

## 2022-07-10 MED ORDER — NOREPINEPHRINE 4 MG/250ML-% IV SOLN
2.0000 ug/min | INTRAVENOUS | Status: DC
  Administered 2022-07-10: 4 ug/min via INTRAVENOUS
  Administered 2022-07-10: 16 ug/min via INTRAVENOUS
  Filled 2022-07-10: qty 250

## 2022-07-10 MED ORDER — PANTOPRAZOLE SODIUM 40 MG PO TBEC
40.0000 mg | DELAYED_RELEASE_TABLET | Freq: Two times a day (BID) | ORAL | 0 refills | Status: DC
  Filled 2022-07-10: qty 60, 30d supply, fill #0

## 2022-07-10 MED ORDER — FENTANYL CITRATE (PF) 100 MCG/2ML IJ SOLN
INTRAMUSCULAR | Status: AC
  Administered 2022-07-10: 100 ug
  Filled 2022-07-10: qty 2

## 2022-07-10 MED ORDER — EPINEPHRINE 1 MG/10ML IJ SOSY
PREFILLED_SYRINGE | INTRAMUSCULAR | Status: AC
  Filled 2022-07-10: qty 10

## 2022-07-10 MED ORDER — PANTOPRAZOLE SODIUM 40 MG IV SOLR
40.0000 mg | Freq: Once | INTRAVENOUS | Status: AC
  Administered 2022-07-10: 40 mg via INTRAVENOUS
  Filled 2022-07-10: qty 10

## 2022-07-10 MED ORDER — URSODIOL 300 MG PO CAPS
600.0000 mg | ORAL_CAPSULE | Freq: Two times a day (BID) | ORAL | 3 refills | Status: DC
  Filled 2022-07-10: qty 120, 30d supply, fill #0

## 2022-07-10 SURGICAL SUPPLY — 15 items

## 2022-07-10 NOTE — Progress Notes (Addendum)
eLink Physician-Brief Progress Note Patient Name: Hannah Blake DOB: [birth year] MRN: [id number]   Date of Service  07/10/2022  HPI/Events of Note  71/F with primary biliary cirrhosis with esophageal varies and multiple GI bleeds in the past, presents with hematochezia.   Pt with active bleeding and had 2 more bloody bowel movements since arriving in the ICU.  She is on levophed.  BP 83/56, HR 73, RR 15, O2 sats 99% Pt is awake and alert  eICU Interventions  Transfuse pRBCs and FFP. Continue to trend H&H. Transfuse 2 more units pRBCs.  Advised RN to call GI.  They were consulted in the ED. Levophed ordered to maintain MAP >65. Continue PPI and octreotide. SCDs for DVT prophylaxis.      Intervention Category Evaluation Type: New Patient Evaluation  [name] 07/10/2022, 8:51 PM

## 2022-07-10 NOTE — H&P (View-Only) (Signed)
[facility] Gastroenterology Consult  Referring Provider: No ref. provider found Primary Care Physician:  Johny Blamer, DO Primary Gastroenterologist: [facility]  Reason for Consultation:  Hematemesis and melena  HPI: Hannah Blake is a 71 y.o. 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  In the ER she was found to be hypotensive and was admitted to the ICU where she was started on Levophed and has had multiple episodes of hematemesis and ongoing melanotic stools as well as hematochezia. Patient is now being intubated for emergent EGD. Recent EKG from 07/06/2022 showed clinical esophageal ulcers, gastric ulcers and duodenal ulcers.   Past Medical History:  Diagnosis Date   Bell's palsy    Cirrhosis ([place])    Coronary artery disease    Heart failure with reduced ejection fraction ([place])    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[facility])     Past Surgical History:  Procedure Laterality Date   BIOPSY  01/14/2022   Procedure: BIOPSY;  Surgeon: Otis Brace, MD;  Location: [facility] ENDOSCOPY;  Service: Gastroenterology;;   BIOPSY  07/06/2022   Procedure: BIOPSY;  Surgeon: Otis Brace, MD;  Location: [facility];  Service: Gastroenterology;;   CLAVICLE SURGERY     ESOPHAGEAL BANDING  01/14/2022   Procedure: ESOPHAGEAL BANDING;  Surgeon: Otis Brace, MD;  Location: [facility];  Service: Gastroenterology;;   ESOPHAGOGASTRODUODENOSCOPY N/A 01/14/2022   Procedure: ESOPHAGOGASTRODUODENOSCOPY (EGD);  Surgeon: Otis Brace, MD;  Location: [facility];  Service: Gastroenterology;  Laterality: N/A;   ESOPHAGOGASTRODUODENOSCOPY (EGD) WITH PROPOFOL N/A 07/06/2022   Procedure: ESOPHAGOGASTRODUODENOSCOPY (EGD) WITH PROPOFOL;  Surgeon: Otis Brace, MD;  [facility]: [facility];  Service: Gastroenterology;  Laterality:  N/A;   FRACTURE SURGERY      Prior to Admission medications   Medication Sig Start Date End Date Taking? Authorizing Provider  ciprofloxacin (CIPRO) 500 MG tablet Take 1 tablet (500 mg total) by mouth 2 (two) times daily for 2 days. 07/09/22 07/11/22  Florencia Reasons, MD  furosemide (LASIX) 20 MG tablet Take 1 tablet (20 mg total) by mouth every Monday, Wednesday, and Friday. 07/09/22 08/08/22  Florencia Reasons, MD  pantoprazole (PROTONIX) 40 MG tablet Take 1 tablet (40 mg total) by mouth 2 (two) times daily before a meal. 07/10/22   Leigh Aurora, DO  spironolactone (ALDACTONE) 50 MG tablet Take 1 tablet (50 mg total) by mouth every Monday, Wednesday, and Friday. 07/09/22 08/08/22  Florencia Reasons, MD  ursodiol (ACTIGALL) 300 MG capsule Take 2 capsules (600 mg total) by mouth 2 (two) times daily. 07/10/22   Leigh Aurora, DO    Current Facility-Administered Medications  Medication Dose Route Frequency Provider Last Rate Last Admin   0.9 %  sodium chloride infusion (Manually program via Guardrails IV Fluids)   Intravenous Once Elsie Lincoln, MD       0.9 %  sodium chloride infusion  250 mL Intravenous Continuous Hunsucker, Bonna Gains, MD   Stopped at 07/10/22 2113   cefTRIAXone (ROCEPHIN) 2 g in sodium chloride 0.9 % 100 mL IVPB  2 g Intravenous q1800 Hunsucker, Bonna Gains, MD   Stopped at 07/10/22 2215   docusate (COLACE) 50 MG/5ML liquid 100 mg  100 mg Per Tube BID Maryjane Hurter, MD       docusate sodium (COLACE) capsule 100 mg  100 mg Oral BID PRN Hunsucker, Bonna Gains, MD       erythromycin 1,000 mg  in sodium chloride 0.9 % 250 mL IVPB  1,000 mg Intravenous STAT Ronnette Juniper, MD 250 mL/hr at 07/10/22 2223 1,000 mg at 07/10/22 2223   fentaNYL (SUBLIMAZE) 50 MCG/ML injection            fentaNYL (SUBLIMAZE) injection 25 mcg  25 mcg Intravenous Q15 min PRN Maryjane Hurter, MD       fentaNYL (SUBLIMAZE) injection 25-100 mcg  25-100 mcg Intravenous Q30 min PRN Maryjane Hurter, MD       ketamine HCl 50 MG/5ML SOSY             norepinephrine (LEVOPHED) 4mg  in 225mL (0.016 mg/mL) premix infusion  2-10 mcg/min Intravenous Titrated Hunsucker, Bonna Gains, MD 30 mL/hr at 07/10/22 2113 8 mcg/min at 07/10/22 2113   octreotide (SANDOSTATIN) 500 mcg in sodium chloride 0.9 % 250 mL (2 mcg/mL) infusion  50 mcg/hr Intravenous Continuous Hunsucker, Bonna Gains, MD 25 mL/hr at 07/10/22 2113 50 mcg/hr at 07/10/22 2113   [START ON 07/11/2022] pantoprazole (PROTONIX) injection 40 mg  40 mg Intravenous Q12H Hunsucker, Bonna Gains, MD       polyethylene glycol (MIRALAX / GLYCOLAX) packet 17 g  17 g Oral Daily PRN Hunsucker, Bonna Gains, MD       [START ON 07/11/2022] polyethylene glycol (MIRALAX / GLYCOLAX) packet 17 g  17 g Per Tube Daily Maryjane Hurter, MD       propofol (DIPRIVAN) 1000 MG/100ML infusion  0-50 mcg/kg/min Intravenous Continuous Maryjane Hurter, MD       rocuronium bromide 100 MG/10ML SOSY             Allergies as of 07/10/2022   (No Known Allergies)    Family History  Family history unknown: Yes    Social History   Socioeconomic History   Marital status: Single    Spouse name: Not on file   Number of children: Not on file   Years of education: Not on file   Highest education level: Not on file  Occupational History   Not on file  Tobacco Use   Smoking status: Every Day    Packs/day: 1.00    Years: 40.00    Total pack years: 40.00    Types: Cigarettes   Smokeless tobacco: Never  Vaping Use   Vaping Use: Never used  Substance and Sexual Activity   Alcohol use: Not Currently    Comment: weekly   Drug use: Never   Sexual activity: Not on file  Other Topics Concern   Not on file  Social History Narrative   Not on file   Social Determinants of Health   Financial Resource Strain: Not on file  Food Insecurity: Unknown (07/06/2022)   Hunger Vital Sign    Worried About Running Out of Food in the Last Year: Patient refused    Ran Out of Food in the Last Year: Patient refused   Transportation Needs: Unknown (07/06/2022)   PRAPARE - Hydrologist (Medical): Patient refused    Lack of Transportation (Non-Medical): Patient refused  Physical Activity: Not on file  Stress: Not on file  Social Connections: Not on file  Intimate Partner Violence: Not At Risk (07/06/2022)   Humiliation, Afraid, Rape, and Kick questionnaire    Fear of Current or Ex-Partner: No    Emotionally Abused: No    Physically Abused: No    Sexually Abused: No    Review of Systems: As per HPI  Physical Exam: Vital signs  in last 24 hours: Temp:  [95.1 F (35.1 C)-99 F (37.2 C)] 99 F (37.2 C) (12/21 2220) Pulse Rate:  [65-90] 74 (12/21 2015) Resp:  [11-29] 18 (12/21 2015) BP: (50-120)/(28-98) 120/46 (12/21 2015) SpO2:  [97 %-100 %] 100 % (12/21 2015) Weight:  [45.6 kg] 45.6 kg (12/21 2000)    General:   Intubated Head:  Normocephalic and atraumatic. Eyes: Prominent pallor, mild icterus Mouth: Debated Heart: Tachycardic Extremities:  Without clubbing or edema. Neurologic: Intubated  Skin:  Intact without significant lesions or rashes. Psych: Intubated Abdomen: Distended         Lab Results: Recent Labs    07/09/22 0304 07/10/22 1713 07/10/22 2141 07/10/22 2143  WBC 8.6 34.2*  --  35.1*  HGB 8.3* 4.9*  --  9.5*  HCT 26.9* 17.1*  --  27.9*  PLT 163 197 128* 130*   BMET Recent Labs    07/08/22 0336 07/09/22 0304 07/10/22 2141  NA 135 135 138  K 4.0 4.1 4.3  CL 107 102 107  CO2 24 24 20*  GLUCOSE 94 97 133*  BUN 23 16 18   CREATININE 0.79 1.00 1.02*  CALCIUM 7.9* 8.0* 7.0*   LFT Recent Labs    07/10/22 2141  PROT 5.0*  ALBUMIN 1.9*  AST 83*  ALT 39  ALKPHOS 171*  BILITOT 3.1*   PT/INR Recent Labs    07/10/22 2141  LABPROT 17.1*  INR 1.4*    Studies/Results: DG Chest Portable 1 View  Result Date: 07/10/2022 CLINICAL DATA:  Hypoxia and shortness of breath EXAM: PORTABLE CHEST 1 VIEW COMPARISON:  Chest x-ray  07/05/2022 FINDINGS: The cardiomediastinal silhouette is within normal limits. There is no focal lung infiltrate, pleural effusion or pneumothorax. No acute fractures are seen. IMPRESSION: No active disease. Electronically Signed   By: 07/07/2022 M.D.   On: 07/10/2022 18:15    Impression: Hematemesis, melena, hematochezia: Hemoglobin 4.9 on admission and 9.5 now Platelet 130 WBC 35.1 PT/INR 17.1/1.4 She has received 6 units of PRBC, 3 FFP's and vitamin K Continues to remain hypotensive, tachycardic  T. bili 3.1, AST 83, ALT 39, ALP 171 Lactic acid 4.4 Cirrhosis, MELDna 16, AMA high 123.9 in 6/23 consistent with PBC History of ascites,s/p paracentesis 12/24/21, was on diuretics at home  Plan: Plan emergent EGD at bedside. Continue supportive management, blood products, IV PPI and IV octreotide, IV ceftriaxone. Patient has been given Reglan 10 mg IV 1 dose and erythromycin 1000 mg IV x 1 has been started. Guarded prognosis   LOS: 0 days   02/23/22, MD  07/10/2022, 10:48 PM

## 2022-07-10 NOTE — H&P (Signed)
NAME:  Hannah Blake, MRN:  [id number], DOB:  [birth year], LOS: 0 ADMISSION DATE:  07/10/2022, CONSULTATION DATE: 12/21 REFERRING MD: Dr. Rush Landmark, CHIEF COMPLAINT: Blood in stool   History of Present Illness:  71 y/o F who presented to [name] ER with reports of bloody stools.   The patient was admitted from 12/16-12/20/23 for hematemesis.  She was admitted with shock in the setting of GIB.  She required 4 units PRBC.  GI consulted and EGD completed showed ulcerations in the esophagus, stomach and duodenum with clots noted throughout the stomach. No significant varices on EGD noted. GI was concerned for possible NSAID use PTA. She required vasopressors, PPI infusion.  Rec's at discharge were for PPI BID x3 months then indefinetly, ursodiol and close follow up. Per chart review, grandson went to pick up ursodiol and it was 550$, and was unable to pick up.  She returns on 12/21 with reports of getting dizzy while having a bowel movement. Family noted blood in toilet. EMS was activated.  Initial evaluation noted her to have a blood pressure of 60/30.  On arrival to ER, RA saturations 83% and was placed on a NRB. Initial labs notable for Hgb 4.9, WBC 34.2, platelets 197. Temp 95.1. Other labs pending.   PCCM called for ICU admission.   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 rate 20, SpO2 100 %.       No intake or output data in the 24 hours ending 07/10/22 1820 There were no vitals filed for this visit.  Examination: General: acute ill, frail elderly female lying  in bed. Family at bedside  HENT: MM pale, dry, arcus senilis, NRB in place  Lungs: non-labored, lungs bilaterally with fine crackles Cardiovascular: s1s2 RRR, SR 70's Abdomen: flat, soft, non-tender to palpation, no ascites  Extremities: pale, dry, no edema  Neuro: Awake, alert, follows commands / appropriate, speech clear    Resolved Hospital Problem list      Assessment & Plan:   Hemorrhagic Shock due to GIB  Known Esophageal Ulcers  ABLA, Symptomatic Anemia  Near Syncope secondary to Above Discharged 12/20, Hgb 8.3.  Most recent scope with ulcerations concerning for possible NSAID use.  No evidence of varices.  -admit to ICU  -now MTP protocol  -trend CBC  -GI Consulted per EDP  -empiric rocephin for SBP, assess abd with [place] in am if more stable -now levophed for MAP >65  -vitamin K 10 mg IV  -PPI BID -octreotide IV > can hold if IV access becomes an issue -await further labs / BMP etc >   Primary Biliary Cholangitis  Hepatic Cirrhosis without Ascites GERD -per GI / followed by Dr. Levora Angel  Chronic HFpEF  -at risk volume overload with transfusion  -follow clinical status, once stabilized, may need diuresis  -follow I/O's    Best Practice (right click and "Reselect all SmartList Selections" daily)  Diet/type: NPO DVT prophylaxis: SCD GI prophylaxis: PPI Lines: N/A Foley:  N/A Code Status:  full code Last date of multidisciplinary goals of care discussion: 12/21 -  confirmed with grandsons, who indicate they are HCPOA, that she is full code.  Would be accepting of all measures in the event of decline.   Labs   CBC: Recent Labs  Lab 07/07/22 0328 07/07/22 1927 07/08/22 0336 07/09/22 0304 07/10/22 1713  WBC 7.4 8.5 7.9 8.6 34.2*  NEUTROABS  --   --  4.7 5.8 28.6*  HGB 8.2* 8.7* 8.4* 8.3* 4.9*  HCT 24.3* 27.1* 26.8* 26.9* 17.1*  MCV 85.6 88.0 88.7 91.5 103.6*  PLT 141* 147* 144* 163 197    Basic Metabolic Panel: Recent Labs  Lab 07/05/22 2056  07/06/22 0319 07/07/22 0328 07/08/22 0336 07/09/22 0304  NA 136  --  136 135 135  K 4.7  --  4.6 4.0 4.1  CL 105  --  111 107 102  CO2 17*  --  23 24 24   GLUCOSE 142*  --  100* 94 97  BUN 39*  --  38* 23 16  CREATININE 1.15*  --  0.83 0.79 1.00  CALCIUM 8.4*  --  7.9* 7.9* 8.0*  MG  --  1.9 2.0 1.8 2.0  PHOS  --  5.6* 2.2* 2.7  --    GFR: Estimated Creatinine Clearance: 34.7 mL/min (by C-G formula based on SCr of 1 mg/dL). Recent Labs  Lab 07/05/22 2056 07/06/22 0319 07/06/22 1331 07/07/22 1927 07/08/22 0336 07/09/22 0304 07/10/22 1713  WBC 18.8* 24.5*   < > 8.5 7.9 8.6 34.2*  LATICACIDVEN 7.4* 3.1*  --   --   --   --   --    < > = values in this interval not displayed.    Liver Function Tests: Recent Labs  Lab 07/05/22 2056 07/08/22 0336  AST 117* 94*  ALT 44 51*  ALKPHOS 323* 276*  BILITOT 2.5* 2.4*  PROT 6.5 6.2*  ALBUMIN 1.8* 1.7*   No results for input(s): "LIPASE", "AMYLASE" in the last 168 hours. No results for input(s): "AMMONIA" in the last 168 hours.  ABG    Component Value Date/Time   HCO3 22.5 01/12/2022 2050   ACIDBASEDEF 2.5 (H) 01/12/2022 2050   O2SAT 49.2 01/12/2022 2050     Coagulation Profile: Recent Labs  Lab 07/05/22 2129  INR 1.5*    Cardiac Enzymes: No results for input(s): "CKTOTAL", "CKMB", "CKMBINDEX", "TROPONINI" in the last 168 hours.  HbA1C: No results found for: "HGBA1C"  CBG: No results for input(s): "GLUCAP" in the last 168 hours.  Review of Systems: Positives in [place]  Gen: Denies fever, chills, weight change, fatigue, night sweats, dizziness with BM HEENT: Denies blurred vision, double vision, hearing loss, tinnitus, sinus congestion, rhinorrhea, sore throat, neck stiffness, dysphagia PULM: Denies shortness of breath, cough, sputum production, hemoptysis, wheezing CV: Denies chest pain, edema, orthopnea, paroxysmal nocturnal dyspnea, palpitations GI: Denies abdominal pain, nausea, vomiting, diarrhea, "blood in  toilet" hematochezia, melena, constipation, change in bowel habits GU: Denies dysuria, hematuria, polyuria, oliguria, urethral discharge Endocrine: Denies hot or cold intolerance, polyuria, polyphagia or appetite change Derm: Denies rash, dry skin, scaling or peeling skin change Heme: Denies easy bruising, bleeding, bleeding gums Neuro: Denies headache, numbness, weakness, slurred speech, loss of memory or consciousness   Past Medical History:  She,  has a past medical history of Bell's palsy, Cirrhosis (HCC), Coronary artery disease, Heart failure with reduced ejection fraction (HCC), and 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(HCC).   Surgical History:   Past Surgical History:  Procedure Laterality Date   BIOPSY  01/14/2022   Procedure: BIOPSY;  Surgeon: Kathi Der, MD;  Location: [facility];  Service: Gastroenterology;;   BIOPSY  07/06/2022   Procedure: BIOPSY;  Surgeon: Kathi Der, MD;  Location: [facility];  Service: Gastroenterology;;   CLAVICLE SURGERY     ESOPHAGEAL BANDING  01/14/2022   Procedure: ESOPHAGEAL BANDING;  Surgeon: Kathi Der, MD;  Location: [facility];  Service: Gastroenterology;;   ESOPHAGOGASTRODUODENOSCOPY N/A 01/14/2022   Procedure: ESOPHAGOGASTRODUODENOSCOPY (EGD);  Surgeon: Kathi Der, MD;  Location: [facility];  Service: Gastroenterology;  Laterality: N/A;   ESOPHAGOGASTRODUODENOSCOPY (EGD) WITH PROPOFOL N/A 07/06/2022   Procedure: ESOPHAGOGASTRODUODENOSCOPY (EGD) WITH PROPOFOL;  Surgeon: Kathi Der, MD;  Location: [facility];  Service: Gastroenterology;  Laterality: N/A;   FRACTURE SURGERY       Social History:   reports that she has been smoking cigarettes. She has a 40.00 pack-year smoking history. She has never used smokeless tobacco. She reports that she does not currently use alcohol. She reports that she does not use drugs.   Family History:  Her Family history is unknown by patient.   Allergies No Known Allergies    Home Medications  Prior to Admission medications   Medication Sig Start Date End Date Taking? Authorizing Provider  ciprofloxacin (CIPRO) 500 MG tablet Take 1 tablet (500 mg total) by mouth 2 (two) times daily for 2 days. 07/09/22 07/11/22  Albertine Grates, MD  furosemide (LASIX) 20 MG tablet Take 1 tablet (20 mg total) by mouth every Monday, Wednesday, and Friday. 07/09/22 08/08/22  Albertine Grates, MD  pantoprazole (PROTONIX) 40 MG tablet Take 1 tablet (40 mg total) by mouth 2 (two) times daily before a meal. 07/10/22   Modena Slater, DO  spironolactone (ALDACTONE) 50 MG tablet Take 1 tablet (50 mg total) by mouth every Monday, Wednesday, and Friday. 07/09/22 08/08/22  Albertine Grates, MD  ursodiol (ACTIGALL) 300 MG capsule Take 2 capsules (600 mg total) by mouth 2 (two) times daily. 07/10/22   Modena Slater, DO     Critical care time: 34 minutes     Canary Brim, MSN, APRN, NP-C, AGACNP-BC [facility] 07/10/2022, 6:20 PM   Please see [email or web address] for pager details.   From 7A-7P if no response, please call [phone] After hours, please call ELink [phone]

## 2022-07-10 NOTE — ED Notes (Signed)
Dr. Rush Landmark bedside, MD aware pts hemoglobin 4.9.

## 2022-07-10 NOTE — ED Triage Notes (Addendum)
PT BIB EMS from home. Pt recently discharged from [facility] ED for a GI bleed. Pt was havinga bowel movement today, got dizzy, bloody stool in the toilet per EMS. EMS reports initial pressure 60/30. Pt has had recent blood transfusions. Pt family denies LOC.  Hx of cirrhosis.  EMS gave 300 mL NS IV HR 100 20G L AC 257 CBG

## 2022-07-10 NOTE — Progress Notes (Signed)
eLink Physician-Brief Progress Note Patient Name: ODILE VELOSO DOB: 08/19/1950 MRN: [id number]   Date of Service  07/10/2022  HPI/Events of Note  Pt intubated for urgent endoscopy by GI. Ground team notified. Pt is not intubated.   Requested to put in orders for emergent release of more blood products for this patient.   eICU Interventions  2 units pRBCs with 2 more ordered. 1 unit platelets ordered.     Intervention Category Intermediate Interventions: Other:  [name] 07/10/2022, 11:17 PM

## 2022-07-10 NOTE — ED Notes (Signed)
ED TO INPATIENT HANDOFF REPORT  ED Nurse Name and Phone #: Alroy Bailiff Name/Age/Gender Hannah Blake 71 y.o. female Room/Bed: WA14/WA14  Code Status   Code Status: Full Code  Home/SNF/Other Home Patient oriented to: self, place, time, and situation Is this baseline? Yes   Triage Complete: Triage complete  Chief Complaint Hemorrhagic shock ([place]) [R57.8]  Triage Note PT BIB EMS from home. Pt recently discharged from [facility] ED for a GI bleed. Pt was havinga bowel movement today, got dizzy, bloody stool in the toilet per EMS. EMS reports initial pressure 60/30. Pt has had recent blood transfusions. Pt family denies LOC.  Hx of cirrhosis.  EMS gave 300 mL NS IV HR 100 20G L AC 257 CBG    Allergies No Known Allergies  Level of Care/Admitting Diagnosis ED Disposition     ED Disposition  Admit   Condition  --   Comment  Hospital Area: [place] [[id number]]  Level of Care: ICU [6]  May admit patient to [facility] or [facility] if equivalent level of care is available:: Yes  Covid Evaluation: Asymptomatic - no recent exposure (last 10 days) testing not required  [facility]: [id number]  Admitting Physician: Lanier Clam [id number]  Attending Physician: Lanier Clam [id number]  Certification:: I certify this patient will need inpatient services for at least 2 midnights  Estimated Length of Stay: 5          B Medical/Surgery History Past Medical History:  Diagnosis Date   Bell's palsy    Cirrhosis ([place])    Coronary artery disease    Heart failure with reduced ejection fraction ([place])    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[facility])    Past Surgical History:  Procedure Laterality Date   BIOPSY  01/14/2022   Procedure: BIOPSY;  Surgeon: Otis Brace, MD;  Location: [facility]OSCOPY;  Service: Gastroenterology;;   BIOPSY  07/06/2022   Procedure: BIOPSY;  Surgeon: Otis Brace, MD;  Location: [facility];  Service:  Gastroenterology;;   CLAVICLE SURGERY     ESOPHAGEAL BANDING  01/14/2022   Procedure: ESOPHAGEAL BANDING;  Surgeon: Otis Brace, MD;  Location: [facility];  Service: Gastroenterology;;   ESOPHAGOGASTRODUODENOSCOPY N/A 01/14/2022   Procedure: ESOPHAGOGASTRODUODENOSCOPY (EGD);  Surgeon: Otis Brace, MD;  Location: [facility];  Service: Gastroenterology;  Laterality: N/A;   ESOPHAGOGASTRODUODENOSCOPY (EGD) WITH PROPOFOL N/A 07/06/2022   Procedure: ESOPHAGOGASTRODUODENOSCOPY (EGD) WITH PROPOFOL;  Surgeon: Otis Brace, MD;  Location: [facility];  Service: Gastroenterology;  Laterality: N/A;   FRACTURE SURGERY       A IV Location/Drains/Wounds Patient Lines/Drains/Airways Status     Active Line/Drains/Airways     Name Placement date Placement time Site Days   Peripheral IV 07/10/22 20 G Left Antecubital 07/10/22  --  Antecubital  less than 1   Airway 01/14/22  1029  -- 177            Intake/Output Last 24 hours No intake or output data in the 24 hours ending 07/10/22 1908  Labs/Imaging Results for orders placed or performed during the hospital encounter of 07/10/22 (from the past 48 hour(s))  Type and screen [place]     Status: None (Preliminary result)   Collection Time: 07/10/22  5:11 PM  Result Value Ref Range   ABO/RH(D) PENDING    Antibody Screen PENDING    Sample Expiration 07/13/2022,2359    Unit Number [id number]    Blood Component Type RED CELLS,LR  Unit division 00    Status of Unit ISSUED    Unit tag comment EMERGENCY RELEASE    Transfusion Status OK TO TRANSFUSE    Crossmatch Result PENDING    Unit Number [id number]    Blood Component Type RED CELLS,LR    Unit division 00    Status of Unit ISSUED    Unit tag comment EMERGENCY RELEASE    Transfusion Status      OK TO TRANSFUSE Performed at [facility] [address], [place], [place] [address]    Crossmatch Result PENDING   CBC with  Differential     Status: Abnormal   Collection Time: 07/10/22  5:13 PM  Result Value Ref Range   WBC 34.2 (H) 4.0 - 10.5 K/uL   RBC 1.65 (L) 3.87 - 5.11 MIL/uL   Hemoglobin 4.9 (LL) 12.0 - 15.0 g/dL    Comment: This critical result has verified and been called to RN T BRUNSON by Alda Lea on 12 21 2023 at 27, and has been read back.    HCT 17.1 (L) 36.0 - 46.0 %   MCV 103.6 (H) 80.0 - 100.0 fL   MCH 29.7 26.0 - 34.0 pg   MCHC 28.7 (L) 30.0 - 36.0 g/dL   RDW 19.3 (H) 11.5 - 15.5 %   Platelets 197 150 - 400 K/uL   nRBC 1.4 (H) 0.0 - 0.2 %   Neutrophils Relative % 85 %   Neutro Abs 28.6 (H) 1.7 - 7.7 K/uL   Lymphocytes Relative 5 %   Lymphs Abs 1.8 0.7 - 4.0 K/uL   Monocytes Relative 5 %   Monocytes Absolute 1.8 (H) 0.1 - 1.0 K/uL   Eosinophils Relative 0 %   Eosinophils Absolute 0.1 0.0 - 0.5 K/uL   Basophils Relative 0 %   Basophils Absolute 0.1 0.0 - 0.1 K/uL   WBC Morphology MORPHOLOGY UNREMARKABLE    Smear Review MORPHOLOGY UNREMARKABLE    Immature Granulocytes 5 %   Abs Immature Granulocytes 1.85 (H) 0.00 - 0.07 K/uL   Polychromasia PRESENT     Comment: Performed at [facility], [place] [address], [place], [place] [address]  Type and screen [place]     Status: None (Preliminary result)   Collection Time: 07/10/22  6:04 PM  Result Value Ref Range   ABO/RH(D) PENDING    Antibody Screen PENDING    Sample Expiration 07/13/2022,2359    Unit Number [id number]    Blood Component Type RED CELLS,LR    Unit division 00    Status of Unit ISSUED    Unit tag comment EMERGENCY RELEASE    Transfusion Status OK TO TRANSFUSE    Crossmatch Result PENDING    Unit Number [id number]    Blood Component Type RED CELLS,LR    Unit division 00    Status of Unit ISSUED    Unit tag comment EMERGENCY RELEASE    Transfusion Status OK TO TRANSFUSE    Crossmatch Result PENDING    Unit Number [id number]    Blood Component Type RED  CELLS,LR    Unit division 00    Status of Unit ISSUED    Unit tag comment EMERGENCY RELEASE    Transfusion Status      OK TO TRANSFUSE Performed at [facility] [address] [place], [place] [address]    Crossmatch Result PENDING    Unit Number [id number]    Blood Component Type RED CELLS,LR  Unit division 00    Status of Unit ISSUED    Unit tag comment EMERGENCY RELEASE    Transfusion Status OK TO TRANSFUSE    Crossmatch Result PENDING   Prepare fresh frozen plasma     Status: None (Preliminary result)   Collection Time: 07/10/22  6:05 PM  Result Value Ref Range   Unit Number [id number]    Blood Component Type THW PLS APHR    Unit division B0    Status of Unit ISSUED    Unit tag comment EMERGENCY RELEASE    Transfusion Status OK TO TRANSFUSE    Unit Number [id number]    Blood Component Type THW PLS APHR    Unit division B0    Status of Unit ISSUED    Unit tag comment EMERGENCY RELEASE    Transfusion Status      OK TO TRANSFUSE Performed at [facility], [address], [place], [place] [address]    Unit Number [id number]    Blood Component Type THW PLS APHR    Unit division 00    Status of Unit ISSUED    Unit tag comment EMERGENCY RELEASE    Transfusion Status OK TO TRANSFUSE   ABO/Rh     Status: None   Collection Time: 07/10/22  6:17 PM  Result Value Ref Range   ABO/RH(D)      B POS Performed at [facility], [address], [place], [place] [address]   Initiate MTP (Blood Bank Notification)     Status: None   Collection Time: 07/10/22  6:24 PM  Result Value Ref Range   Initiate Massive Transfusion Protocol      MTP ORDER RECEIVED Performed at [facility], [address] [place], [place] [address]    DG Chest Portable 1 View  Result Date: 07/10/2022 CLINICAL DATA:  Hypoxia and shortness of breath EXAM: PORTABLE CHEST 1 VIEW COMPARISON:  Chest x-ray 07/05/2022  FINDINGS: The cardiomediastinal silhouette is within normal limits. There is no focal lung infiltrate, pleural effusion or pneumothorax. No acute fractures are seen. IMPRESSION: No active disease. Electronically Signed   By: Darliss Cheney M.D.   On: 07/10/2022 18:15    Pending Labs Unresulted Labs (From admission, onward)     Start     Ordered   07/11/22 0500  CBC  Tomorrow morning,   R        07/10/22 1827   07/11/22 0500  Basic metabolic panel  Tomorrow morning,   R        07/10/22 1827   07/11/22 0500  Magnesium  Tomorrow morning,   R        07/10/22 1827   07/11/22 0500  Phosphorus  Tomorrow morning,   R        07/10/22 1827   07/10/22 1805  Protime-INR  ONCE - STAT,   STAT        07/10/22 1804   07/10/22 1800  DIC Panel now then every 30 minutes  (MTP Massive transfusion protocol panel)  now then every 30 minutes,   R      07/10/22 1803   07/10/22 1800  Initiate MTP (Blood Bank Notification)  (MTP Massive transfusion protocol panel)  ONCE - STAT,   STAT        07/10/22 1803   07/10/22 1725  Comprehensive metabolic panel  Once,   STAT        07/10/22 1725   07/10/22 1725  Lipase,  blood  Once,   STAT        07/10/22 1725   07/10/22 1712  Lactic acid, plasma  Now then every 2 hours,   R      07/10/22 1714   07/10/22 1712  Brain natriuretic peptide  Once,   URGENT        07/10/22 1714   07/10/22 1711  Prepare RBC (crossmatch)  (Emergency Blood Administration)  Once,   R       Question Answer Comment  # of Units 2 units   Transfusion Indications Acute blood loss with shock   Number of Units to Keep Ahead NO units ahead   If emergent release call blood bank [facility] [id number]      07/10/22 1714            Vitals/Pain Today's Vitals   07/10/22 1811 07/10/22 1815 07/10/22 1823 07/10/22 1830  BP: (!) 51/32 (!) 50/33 93/80 (!) 69/49  Pulse: 78 78 68 84  Resp: 20 13 20 14   Temp: (!) 95.1 F (35.1 C)     TempSrc: Rectal     SpO2:  100% 100% 100%    Isolation  Precautions No active isolations  Medications Medications  phytonadione (VITAMIN K) 10 mg in dextrose 5 % 50 mL IVPB (has no administration in time range)  0.9 %  sodium chloride infusion (has no administration in time range)  norepinephrine (LEVOPHED) 4mg  in 250mL (0.016 mg/mL) premix infusion (has no administration in time range)  docusate sodium (COLACE) capsule 100 mg (has no administration in time range)  polyethylene glycol (MIRALAX / GLYCOLAX) packet 17 g (has no administration in time range)  pantoprazole (PROTONIX) injection 40 mg (has no administration in time range)  cefTRIAXone (ROCEPHIN) 2 g in sodium chloride 0.9 % 100 mL IVPB (has no administration in time range)  octreotide (SANDOSTATIN) 2 mcg/mL load via infusion 50 mcg (has no administration in time range)    And  octreotide (SANDOSTATIN) 500 mcg in sodium chloride 0.9 % 250 mL (2 mcg/mL) infusion (has no administration in time range)  0.9 %  sodium chloride infusion (Manually program via Guardrails IV Fluids) ( Intravenous New Bag/Given 07/10/22 1737)  sodium chloride 0.9 % bolus 1,000 mL (1,000 mLs Intravenous New Bag/Given 07/10/22 1722)  pantoprazole (PROTONIX) injection 40 mg (40 mg Intravenous Given 07/10/22 1721)  ondansetron (ZOFRAN) injection 4 mg (4 mg Intravenous Given 07/10/22 1722)    Mobility walks with person assist High fall risk   Focused Assessments    R Recommendations: See Admitting Provider Note  Report given to:   Additional Notes:

## 2022-07-10 NOTE — ED Notes (Signed)
Pt 83% RA, placed on 15L nonrebreather, O2 98%. Dr. Rush Landmark aware.

## 2022-07-10 NOTE — ED Provider Notes (Signed)
[facility]-EMERGENCY DEPT Provider Note   CSN: [id number] Arrival date & time: 07/10/22  1649     History  Chief Complaint  Patient presents with   GI Bleeding    Hannah Blake is a 71 y.o. female.  The history is provided by the patient, medical records and a relative. No language interpreter was used.  Rectal Bleeding Quality:  Maroon Amount:  Copious Duration:  1 day Timing:  Constant Chronicity:  Recurrent Similar prior episodes: yes   Relieved by:  Nothing Worsened by:  Nothing Ineffective treatments:  None tried Associated symptoms: light-headedness   Associated symptoms: no abdominal pain, no fever, no hematemesis, no loss of consciousness and no vomiting        Home Medications Prior to Admission medications   Medication Sig Start Date End Date Taking? Authorizing Provider  ciprofloxacin (CIPRO) 500 MG tablet Take 1 tablet (500 mg total) by mouth 2 (two) times daily for 2 days. 07/09/22 07/11/22  Albertine Grates, MD  furosemide (LASIX) 20 MG tablet Take 1 tablet (20 mg total) by mouth every Monday, Wednesday, and Friday. 07/09/22 08/08/22  Albertine Grates, MD  pantoprazole (PROTONIX) 40 MG tablet Take 1 tablet (40 mg total) by mouth 2 (two) times daily before a meal. 07/10/22   Modena Slater, DO  spironolactone (ALDACTONE) 50 MG tablet Take 1 tablet (50 mg total) by mouth every Monday, Wednesday, and Friday. 07/09/22 08/08/22  Albertine Grates, MD  ursodiol (ACTIGALL) 300 MG capsule Take 2 capsules (600 mg total) by mouth 2 (two) times daily. 07/10/22   Modena Slater, DO      Allergies    Patient has no known allergies.    Review of Systems   Review of Systems  Constitutional:  Positive for fatigue. Negative for chills and fever.  HENT:  Negative for congestion.   Respiratory:  Negative for cough, chest tightness, shortness of breath (but is hypoxia) and wheezing.   Cardiovascular:  Negative for chest pain and palpitations.  Gastrointestinal:  Positive for blood in  stool, hematochezia and nausea. Negative for abdominal pain, constipation, diarrhea, hematemesis, rectal pain and vomiting.  Musculoskeletal:  Negative for back pain, neck pain and neck stiffness.  Skin:  Negative for rash and wound.  Neurological:  Positive for light-headedness. Negative for loss of consciousness and syncope.  Psychiatric/Behavioral:  Negative for agitation.   All other systems reviewed and are negative.   Physical Exam Updated Vital Signs BP (!) 54/28   Pulse 85   Temp 97.9 F (36.6 C) (Oral)   Resp 18   SpO2 100%  Physical Exam Vitals and nursing note reviewed.  Constitutional:      General: She is in acute distress.     Appearance: She is well-developed. She is ill-appearing. She is not toxic-appearing or diaphoretic.  HENT:     Head: Normocephalic and atraumatic.     Nose: No congestion or rhinorrhea.     Mouth/Throat:     Mouth: Mucous membranes are dry.     Pharynx: No oropharyngeal exudate or posterior oropharyngeal erythema.  Eyes:     Extraocular Movements: Extraocular movements intact.     Conjunctiva/sclera: Conjunctivae normal.     Pupils: Pupils are equal, round, and reactive to light.  Cardiovascular:     Rate and Rhythm: Normal rate and regular rhythm.     Heart sounds: No murmur heard. Pulmonary:     Effort: Pulmonary effort is normal. No respiratory distress.     Breath sounds:  Normal breath sounds. No wheezing, rhonchi or rales.  Chest:     Chest wall: No tenderness.  Abdominal:     General: Abdomen is flat.     Palpations: Abdomen is soft.     Tenderness: There is no abdominal tenderness. There is no right CVA tenderness, left CVA tenderness, guarding or rebound.  Musculoskeletal:        General: No swelling or tenderness.     Cervical back: Neck supple. No tenderness.     Right lower leg: No edema.     Left lower leg: No edema.  Skin:    General: Skin is warm and dry.     Capillary Refill: Capillary refill takes less than 2  seconds.     Coloration: Skin is jaundiced and pale.     Findings: No erythema or rash.  Neurological:     Mental Status: She is alert.     Sensory: No sensory deficit.     Motor: No weakness.  Psychiatric:        Mood and Affect: Mood normal.     ED Results / Procedures / Treatments   Labs (all labs ordered are listed, but only abnormal results are displayed) Labs Reviewed  CBC WITH DIFFERENTIAL/PLATELET - Abnormal; Notable for the following components:      Result Value   WBC 34.2 (*)    RBC 1.65 (*)    Hemoglobin 4.9 (*)    HCT 17.1 (*)    MCV 103.6 (*)    MCHC 28.7 (*)    RDW 19.3 (*)    nRBC 1.4 (*)    Neutro Abs 28.6 (*)    Monocytes Absolute 1.8 (*)    Abs Immature Granulocytes 1.85 (*)    All other components within normal limits  LACTIC ACID, PLASMA  LACTIC ACID, PLASMA  BRAIN NATRIURETIC PEPTIDE  COMPREHENSIVE METABOLIC PANEL  LIPASE, BLOOD  DIC (DISSEMINATED INTRAVASCULAR COAGULATION)PANEL  DIC (DISSEMINATED INTRAVASCULAR COAGULATION)PANEL  DIC (DISSEMINATED INTRAVASCULAR COAGULATION)PANEL  DIC (DISSEMINATED INTRAVASCULAR COAGULATION)PANEL  DIC (DISSEMINATED INTRAVASCULAR COAGULATION)PANEL  PROTIME-INR  CBC  BASIC METABOLIC PANEL  MAGNESIUM  PHOSPHORUS  TYPE AND SCREEN  TYPE AND SCREEN  ABO/RH  MASSIVE TRANSFUSION PROTOCOL ORDER (BLOOD BANK NOTIFICATION)  PREPARE RBC (CROSSMATCH)  MASSIVE TRANSFUSION PROTOCOL ORDER (BLOOD BANK NOTIFICATION)  PREPARE FRESH FROZEN PLASMA  TROPONIN I (HIGH SENSITIVITY)    EKG EKG Interpretation  Date/Time:  Thursday July 10 2022 17:58:06 EST Ventricular Rate:  85 PR Interval:  151 QRS Duration: 90 QT Interval:  419 QTC Calculation: 499 R Axis:   36 Text Interpretation: Sinus rhythm Probable left atrial enlargement Low voltage, extremity leads Borderline prolonged QT interval when compared to prior, previous t wave inversionsin leads 1 and AVL have resolved. No STEMI Confirmed by Theda Belfastegeler, Chris ([id number]) on  07/10/2022 6:35:05 PM  Radiology DG Chest Portable 1 View  Result Date: 07/10/2022 CLINICAL DATA:  Hypoxia and shortness of breath EXAM: PORTABLE CHEST 1 VIEW COMPARISON:  Chest x-ray 07/05/2022 FINDINGS: The cardiomediastinal silhouette is within normal limits. There is no focal lung infiltrate, pleural effusion or pneumothorax. No acute fractures are seen. IMPRESSION: No active disease. Electronically Signed   By: Darliss CheneyAmy  Guttmann M.D.   On: 07/10/2022 18:15    Procedures Procedures    CRITICAL CARE Performed by: Canary Brimhristopher J Rudolfo Brandow Total critical care time: 35 minutes Critical care time was exclusive of separately billable procedures and treating other patients. Critical care was necessary to treat or prevent imminent or life-threatening deterioration. Critical care  was time spent personally by me on the following activities: development of treatment plan with patient and/or surrogate as well as nursing, discussions with consultants, evaluation of patient's response to treatment, examination of patient, obtaining history from patient or surrogate, ordering and performing treatments and interventions, ordering and review of laboratory studies, ordering and review of radiographic studies, pulse oximetry and re-evaluation of patient's condition.    Medications Ordered in ED Medications  phytonadione (VITAMIN K) 10 mg in dextrose 5 % 50 mL IVPB (has no administration in time range)  0.9 %  sodium chloride infusion (has no administration in time range)  norepinephrine (LEVOPHED) 4mg  in (0.016 mg/mL) premix infusion (has no administration in time range)  0.9 %  sodium chloride infusion (Manually program via Guardrails IV Fluids) ( Intravenous New Bag/Given 07/10/22 1737)  sodium chloride 0.9 % bolus 1,000 mL (1,000 mLs Intravenous New Bag/Given 07/10/22 1722)  pantoprazole (PROTONIX) injection 40 mg (40 mg Intravenous Given 07/10/22 1721)  ondansetron (ZOFRAN) injection 4 mg (4 mg  Intravenous Given 07/10/22 1722)    ED Course/ Medical Decision Making/ A&P                           Medical Decision Making Amount and/or Complexity of Data Reviewed Labs: ordered. Radiology: ordered.  Risk Prescription drug management. Decision regarding hospitalization.    BARBIE CROSTON is a 71 y.o. female with a past medical history significant for CAD, cirrhosis, CHF, esophageal varices status post banding, and gastric ulcer with GI bleed and symptomatic anemia who was discharged from hospital yesterday presents with near syncope, hypotension, and GI bleeding.  According to family, she was discharged yesterday from the hospital and today had a large amount of blood in the toilet after a bowel movement and was feeling very fatigued, lightheaded, and near syncopal.  Patient was denying chest pain or shortness of breath and was found of oxygen saturations in the 80s on room air.  She had a blood pressure of 54/28 on arrival and is answering questions but is very fatigued.  She is denying fevers, chills, cough, chest pain, abdominal pain, or vomiting.  She does report some nausea.  She denies any abdominal pain whatsoever.  Denies any trauma.  Patient is maintaining airway and answering questions appropriate.  Breath sounds were clear bilaterally on this auscultation on nonrebreather.  Abdomen was nontender and I did hear bowel sounds.  Patient is very pale and jaundiced.  Given pressure in the 50s, her recent symptomatic anemia with GI bleeding, and her appearance of being in likely hemorrhagic shock from rectal bleeding, will go ahead and treat for suspected GI bleed.  Patient will get screening labs but will go ahead and start emergency release blood with 2 units.  We will call GI give her Protonix and give her some nausea medicine for the nausea.  We will continue the normal saline initially given the hypotension but will hopefully transition to blood.  Will call critical care for admission  and will get other workup as well.  Given the hypoxia and nonrebreather use, will get chest x-ray, EKG, troponin.  Will get a BNP.  Patient be admitted for further management.  Critical care will come see patient.  They saw her and want to start massive transfusion protocol.  Spoke to GI who will come see patient.  Patient will be admitted for further management.         Final Clinical Impression(s) /  ED Diagnoses Final diagnoses:  Symptomatic anemia    Clinical Impression: 1. Symptomatic anemia     Disposition: Admit  This note was prepared with assistance of Dragon voice recognition software. Occasional wrong-word or sound-a-like substitutions may have occurred due to the inherent limitations of voice recognition software.      Gibran Veselka, Canary Brim, MD 07/10/22 [phone]

## 2022-07-10 NOTE — Telephone Encounter (Signed)
Hannah Blake was called to be inform of refills - no answer; left message on vm.

## 2022-07-10 NOTE — Telephone Encounter (Signed)
Call form pt's grandson, Briyana Badman, who stated pt was discharged from the hospital yesterday. Pantoprazole and Ursodiol were prescribed - he went to the pharmacy, cost $550.00, too expensive. Because pt has Family planning medicaid and he went SS office this morning to have this changed. But in the meantime until it can be changed to plain Medicaid, he's wanting to know if they can be sent to a different pharmacy, cost less. I told him we can try to send rxs to [facility] but I have no idea what the cost will be. He wants know if this will be done today.

## 2022-07-10 NOTE — ED Notes (Signed)
Dr. Rush Landmark bedside, aware of pts BP and 98% on 15L nonrebreather.

## 2022-07-10 NOTE — Consult Note (Addendum)
[facility] Consult  Referring Provider: No ref. provider found Primary Care Physician:  Goodwin, Joseph, DO Primary Gastroenterologist: [facility]CC[facility]  Reason for Consultation:  Hematemesis and melena  HPI: Hannah Blake is a 71 y.o. 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  In the ER she was found to be hypotensive and was admitted to the ICU where she was started on Levophed and has had multiple episodes of hematemesis and ongoing melanotic stools as well as hematochezia. Patient is now being intubated for emergent EGD. Recent EKG from 07/06/2022 showed clinical esophageal ulcers, gastric ulcers and duodenal ulcers.   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(HCC)     Past Surgical History:  Procedure Laterality Date   BIOPSY  01/14/2022   Procedure: BIOPSY;  Surgeon: Brahmbhatt, Parag, MD;  Location: [facility];  Service: Gastroenterology;;   BIOPSY  07/06/2022   Procedure: BIOPSY;  Surgeon: Brahmbhatt, Parag, MD;  Location: [facility];  Service: Gastroenterology;;   CLAVICLE SURGERY     ESOPHAGEAL BANDING  01/14/2022   Procedure: ESOPHAGEAL BANDING;  Surgeon: Brahmbhatt, Parag, MD;  Location: [facility];  Service: Gastroenterology;;   ESOPHAGOGASTRODUODENOSCOPY N/A 01/14/2022   Procedure: ESOPHAGOGASTRODUODENOSCOPY (EGD);  Surgeon: Brahmbhatt, Parag, MD;  Location: [facility];  Service: Gastroenterology;  Laterality: N/A;   ESOPHAGOGASTRODUODENOSCOPY (EGD) WITH PROPOFOL N/A 07/06/2022   Procedure: ESOPHAGOGASTRODUODENOSCOPY (EGD) WITH PROPOFOL;  Surgeon: Brahmbhatt, Parag, MD;  Location: [facility];  Service: Gastroenterology;  Laterality:  N/A;   FRACTURE SURGERY      Prior to Admission medications   Medication Sig Start Date End Date Taking? Authorizing Provider  ciprofloxacin (CIPRO) 500 MG tablet Take 1 tablet (500 mg total) by mouth 2 (two) times daily for 2 days. 07/09/22 07/11/22  Xu, Fang, MD  furosemide (LASIX) 20 MG tablet Take 1 tablet (20 mg total) by mouth every Monday, Wednesday, and Friday. 07/09/22 08/08/22  Xu, Fang, MD  pantoprazole (PROTONIX) 40 MG tablet Take 1 tablet (40 mg total) by mouth 2 (two) times daily before a meal. 07/10/22   Patel, Amar, DO  spironolactone (ALDACTONE) 50 MG tablet Take 1 tablet (50 mg total) by mouth every Monday, Wednesday, and Friday. 07/09/22 08/08/22  Xu, Fang, MD  ursodiol (ACTIGALL) 300 MG capsule Take 2 capsules (600 mg total) by mouth 2 (two) times daily. 07/10/22   Patel, Amar, DO    Current Facility-Administered Medications  Medication Dose Route Frequency Provider Last Rate Last Admin   0.9 %  sodium chloride infusion (Manually program via Guardrails IV Fluids)   Intravenous Once Yap, Vanessa, MD       0.9 %  sodium chloride infusion  250 mL Intravenous Continuous Hunsucker, Matthew R, MD   Stopped at 07/10/22 2113   cefTRIAXone (ROCEPHIN) 2 g in sodium chloride 0.9 % 100 mL IVPB  2 g Intravenous q1800 Hunsucker, Matthew R, MD   Stopped at 07/10/22 2215   docusate (COLACE) 50 MG/5ML liquid 100 mg  100 mg Per Tube BID Meier, Nathaniel M, MD       docusate sodium (COLACE) capsule 100 mg  100 mg Oral BID PRN Hunsucker, Matthew R, MD       erythromycin 1,000 mg   in sodium chloride 0.9 % 250 mL IVPB  1,000 mg Intravenous STAT Donyale Berthold, MD 250 mL/hr at 07/10/22 2223 1,000 mg at 07/10/22 2223   fentaNYL (SUBLIMAZE) 50 MCG/ML injection            fentaNYL (SUBLIMAZE) injection 25 mcg  25 mcg Intravenous Q15 min PRN Meier, Nathaniel M, MD       fentaNYL (SUBLIMAZE) injection 25-100 mcg  25-100 mcg Intravenous Q30 min PRN Meier, Nathaniel M, MD       ketamine HCl 50 MG/5ML SOSY             norepinephrine (LEVOPHED) 4mg in 250mL (0.016 mg/mL) premix infusion  2-10 mcg/min Intravenous Titrated Hunsucker, Matthew R, MD 30 mL/hr at 07/10/22 2113 8 mcg/min at 07/10/22 2113   octreotide (SANDOSTATIN) 500 mcg in sodium chloride 0.9 % 250 mL (2 mcg/mL) infusion  50 mcg/hr Intravenous Continuous Hunsucker, Matthew R, MD 25 mL/hr at 07/10/22 2113 50 mcg/hr at 07/10/22 2113   [START ON 07/11/2022] pantoprazole (PROTONIX) injection 40 mg  40 mg Intravenous Q12H Hunsucker, Matthew R, MD       polyethylene glycol (MIRALAX / GLYCOLAX) packet 17 g  17 g Oral Daily PRN Hunsucker, Matthew R, MD       [START ON 07/11/2022] polyethylene glycol (MIRALAX / GLYCOLAX) packet 17 g  17 g Per Tube Daily Meier, Nathaniel M, MD       propofol (DIPRIVAN) 1000 MG/100ML infusion  0-50 mcg/kg/min Intravenous Continuous Meier, Nathaniel M, MD       rocuronium bromide 100 MG/10ML SOSY             Allergies as of 07/10/2022   (No Known Allergies)    Family History  Family history unknown: Yes    Social History   Socioeconomic History   Marital status: Single    Spouse name: Not on file   Number of children: Not on file   Years of education: Not on file   Highest education level: Not on file  Occupational History   Not on file  Tobacco Use   Smoking status: Every Day    Packs/day: 1.00    Years: 40.00    Total pack years: 40.00    Types: Cigarettes   Smokeless tobacco: Never  Vaping Use   Vaping Use: Never used  Substance and Sexual Activity   Alcohol use: Not Currently    Comment: weekly   Drug use: Never   Sexual activity: Not on file  Other Topics Concern   Not on file  Social History Narrative   Not on file   Social Determinants of Health   Financial Resource Strain: Not on file  Food Insecurity: Unknown (07/06/2022)   Hunger Vital Sign    Worried About Running Out of Food in the Last Year: Patient refused    Ran Out of Food in the Last Year: Patient refused   Transportation Needs: Unknown (07/06/2022)   PRAPARE - Transportation    Lack of Transportation (Medical): Patient refused    Lack of Transportation (Non-Medical): Patient refused  Physical Activity: Not on file  Stress: Not on file  Social Connections: Not on file  Intimate Partner Violence: Not At Risk (07/06/2022)   Humiliation, Afraid, Rape, and Kick questionnaire    Fear of Current or Ex-Partner: No    Emotionally Abused: No    Physically Abused: No    Sexually Abused: No    Review of Systems: As per HPI  Physical Exam: Vital signs   in last 24 hours: Temp:  [95.1 F (35.1 C)-99 F (37.2 C)] 99 F (37.2 C) (12/21 2220) Pulse Rate:  [65-90] 74 (12/21 2015) Resp:  [11-29] 18 (12/21 2015) BP: (50-120)/(28-98) 120/46 (12/21 2015) SpO2:  [97 %-100 %] 100 % (12/21 2015) Weight:  [45.6 kg] 45.6 kg (12/21 2000)    General:   Intubated Head:  Normocephalic and atraumatic. Eyes: Prominent pallor, mild icterus Mouth: Debated Heart: Tachycardic Extremities:  Without clubbing or edema. Neurologic: Intubated  Skin:  Intact without significant lesions or rashes. Psych: Intubated Abdomen: Distended         Lab Results: Recent Labs    07/09/22 0304 07/10/22 1713 07/10/22 2141 07/10/22 2143  WBC 8.6 34.2*  --  35.1*  HGB 8.3* 4.9*  --  9.5*  HCT 26.9* 17.1*  --  27.9*  PLT 163 197 128* 130*   BMET Recent Labs    07/08/22 0336 07/09/22 0304 07/10/22 2141  NA 135 135 138  K 4.0 4.1 4.3  CL 107 102 107  CO2 24 24 20*  GLUCOSE 94 97 133*  BUN 23 16 18   CREATININE 0.79 1.00 1.02*  CALCIUM 7.9* 8.0* 7.0*   LFT Recent Labs    07/10/22 2141  PROT 5.0*  ALBUMIN 1.9*  AST 83*  ALT 39  ALKPHOS 171*  BILITOT 3.1*   PT/INR Recent Labs    07/10/22 2141  LABPROT 17.1*  INR 1.4*    Studies/Results: DG Chest Portable 1 View  Result Date: 07/10/2022 CLINICAL DATA:  Hypoxia and shortness of breath EXAM: PORTABLE CHEST 1 VIEW COMPARISON:  Chest x-ray  07/05/2022 FINDINGS: The cardiomediastinal silhouette is within normal limits. There is no focal lung infiltrate, pleural effusion or pneumothorax. No acute fractures are seen. IMPRESSION: No active disease. Electronically Signed   By: 07/07/2022 M.D.   On: 07/10/2022 18:15    Impression: Hematemesis, melena, hematochezia: Hemoglobin 4.9 on admission and 9.5 now Platelet 130 WBC 35.1 PT/INR 17.1/1.4 She has received 6 units of PRBC, 3 FFP's and vitamin K Continues to remain hypotensive, tachycardic  T. bili 3.1, AST 83, ALT 39, ALP 171 Lactic acid 4.4 Cirrhosis, MELDna 16, AMA high 123.9 in 6/23 consistent with PBC History of ascites,s/p paracentesis 12/24/21, was on diuretics at home  Plan: Plan emergent EGD at bedside. Continue supportive management, blood products, IV PPI and IV octreotide, IV ceftriaxone. Patient has been given Reglan 10 mg IV 1 dose and erythromycin 1000 mg IV x 1 has been started. Guarded prognosis   LOS: 0 days   02/23/22, MD  07/10/2022, 10:48 PM

## 2022-07-10 NOTE — Telephone Encounter (Signed)
Transition Care Management Follow-up Telephone Call Date of discharge and from where: Hannah Blake 07/09/2022 How have you been since you were released from the hospital? better Any questions or concerns? Yes  Items Reviewed: Did the pt receive and understand the discharge instructions provided? Yes  Medications obtained and verified? No  Other? No  Any new allergies since your discharge? No  Dietary orders reviewed? Yes Do you have support at home? Yes   Home Care and Equipment/Supplies: Were home health services ordered? no If so, what is the name of the agency? N/a  Has the agency set up a time to come to the patient's home? not applicable Were any new equipment or medical supplies ordered?  No What is the name of the medical supply agency? N/a Were you able to get the supplies/equipment? not applicable Do you have any questions related to the use of the equipment or supplies? No  Functional Questionnaire: (I = Independent and D = Dependent) ADLs: I  Bathing/Dressing- I  Meal Prep- I  Eating- I  Maintaining continence- I  Transferring/Ambulation- I  Managing Meds- I  Follow up appointments reviewed:  PCP Hospital f/u appt confirmed? No  no avail appt times. Sent message to staff to schedule Specialist Hospital f/u appt confirmed? No   Are transportation arrangements needed? No  If their condition worsens, is the pt aware to call PCP or go to the Emergency Dept.? Yes Was the patient provided with contact information for the PCP's office or ED? Yes Was to pt encouraged to call back with questions or concerns? Yes Karena Addison, LPN [facility] Nurse Health Advisor Direct Dial [phone]

## 2022-07-11 ENCOUNTER — Other Ambulatory Visit (HOSPITAL_COMMUNITY): Payer: Self-pay

## 2022-07-11 ENCOUNTER — Encounter (HOSPITAL_COMMUNITY): Payer: Self-pay | Admitting: Pulmonary Disease

## 2022-07-11 ENCOUNTER — Inpatient Hospital Stay (HOSPITAL_COMMUNITY): Payer: Medicare Other

## 2022-07-11 LAB — DIC (DISSEMINATED INTRAVASCULAR COAGULATION)PANEL
D-Dimer, Quant: 5.82 ug/mL-FEU — ABNORMAL HIGH (ref 0.00–0.50)
Fibrinogen: 164 mg/dL — ABNORMAL LOW (ref 210–475)
INR: 1.4 — ABNORMAL HIGH (ref 0.8–1.2)
Platelets: 147 10*3/uL — ABNORMAL LOW (ref 150–400)
Prothrombin Time: 17.1 seconds — ABNORMAL HIGH (ref 11.4–15.2)
Smear Review: NONE SEEN
aPTT: 38 seconds — ABNORMAL HIGH (ref 24–36)

## 2022-07-11 LAB — TYPE AND SCREEN
ABO/RH(D): B POS
Antibody Screen: NEGATIVE
Unit division: 0
Unit division: 0

## 2022-07-11 LAB — BPAM PLATELET PHERESIS
Blood Product Expiration Date: 202312242359
ISSUE DATE / TIME: 202312212336
Unit Type and Rh: 7300

## 2022-07-11 LAB — GLUCOSE, CAPILLARY
Glucose-Capillary: 120 mg/dL — ABNORMAL HIGH (ref 70–99)
Glucose-Capillary: 126 mg/dL — ABNORMAL HIGH (ref 70–99)
Glucose-Capillary: 116 mg/dL — ABNORMAL HIGH (ref 70–99)

## 2022-07-11 LAB — BLOOD GAS, ARTERIAL
Acid-base deficit: 6.8 mmol/L — ABNORMAL HIGH (ref 0.0–2.0)
Bicarbonate: 22.6 mmol/L (ref 20.0–28.0)
Drawn by: 56037
O2 Saturation: 99.8 %
Patient temperature: 36.4
pCO2 arterial: 61 mmHg — ABNORMAL HIGH (ref 32–48)
pH, Arterial: 7.18 — CL (ref 7.35–7.45)
pO2, Arterial: 127 mmHg — ABNORMAL HIGH (ref 83–108)

## 2022-07-11 LAB — CBC
HCT: 31.2 % — ABNORMAL LOW (ref 36.0–46.0)
HCT: 33.5 % — ABNORMAL LOW (ref 36.0–46.0)
HCT: 33.5 % — ABNORMAL LOW (ref 36.0–46.0)
Hemoglobin: 10.6 g/dL — ABNORMAL LOW (ref 12.0–15.0)
Hemoglobin: 11.3 g/dL — ABNORMAL LOW (ref 12.0–15.0)
Hemoglobin: 11.2 g/dL — ABNORMAL LOW (ref 12.0–15.0)
MCH: 29.2 pg (ref 26.0–34.0)
MCH: 29 pg (ref 26.0–34.0)
MCH: 28.9 pg (ref 26.0–34.0)
MCHC: 34 g/dL (ref 30.0–36.0)
MCHC: 33.7 g/dL (ref 30.0–36.0)
MCHC: 33.4 g/dL (ref 30.0–36.0)
MCV: 86 fL (ref 80.0–100.0)
MCV: 85.9 fL (ref 80.0–100.0)
MCV: 86.3 fL (ref 80.0–100.0)
Platelets: 112 10*3/uL — ABNORMAL LOW (ref 150–400)
Platelets: 151 10*3/uL (ref 150–400)
Platelets: 159 10*3/uL (ref 150–400)
RBC: 3.63 MIL/uL — ABNORMAL LOW (ref 3.87–5.11)
RBC: 3.9 MIL/uL (ref 3.87–5.11)
RBC: 3.88 MIL/uL (ref 3.87–5.11)
RDW: 19.3 % — ABNORMAL HIGH (ref 11.5–15.5)
RDW: 19.6 % — ABNORMAL HIGH (ref 11.5–15.5)
RDW: 20.6 % — ABNORMAL HIGH (ref 11.5–15.5)
WBC: 23.6 10*3/uL — ABNORMAL HIGH (ref 4.0–10.5)
WBC: 30.6 10*3/uL — ABNORMAL HIGH (ref 4.0–10.5)
WBC: 27.2 10*3/uL — ABNORMAL HIGH (ref 4.0–10.5)
nRBC: 0.6 % — ABNORMAL HIGH (ref 0.0–0.2)
nRBC: 0.9 % — ABNORMAL HIGH (ref 0.0–0.2)
nRBC: 1.8 % — ABNORMAL HIGH (ref 0.0–0.2)

## 2022-07-11 LAB — PREPARE RBC (CROSSMATCH)

## 2022-07-11 LAB — BPAM RBC
Blood Product Expiration Date: 202401182359
Blood Product Expiration Date: 202401192359
ISSUE DATE / TIME: 202312211721
ISSUE DATE / TIME: 202312211721
Unit Type and Rh: 9500
Unit Type and Rh: 9500

## 2022-07-11 LAB — CULTURE, BLOOD (ROUTINE X 2)
Culture: NO GROWTH
Special Requests: ADEQUATE

## 2022-07-11 LAB — PREPARE PLATELET PHERESIS: Unit division: 0

## 2022-07-11 LAB — MAGNESIUM: Magnesium: 1.9 mg/dL (ref 1.7–2.4)

## 2022-07-11 LAB — BASIC METABOLIC PANEL
Anion gap: 6 (ref 5–15)
BUN: 18 mg/dL (ref 8–23)
CO2: 20 mmol/L — ABNORMAL LOW (ref 22–32)
Calcium: 6.7 mg/dL — ABNORMAL LOW (ref 8.9–10.3)
Chloride: 111 mmol/L (ref 98–111)
Creatinine, Ser: 0.94 mg/dL (ref 0.44–1.00)
GFR, Estimated: >60 mL/min (ref >60)
Glucose, Bld: 133 mg/dL — ABNORMAL HIGH (ref 70–99)
Potassium: 4 mmol/L (ref 3.5–5.1)
Sodium: 137 mmol/L (ref 135–145)

## 2022-07-11 LAB — BLOOD GAS, VENOUS
Acid-base deficit: 5 mmol/L — ABNORMAL HIGH (ref 0.0–2.0)
Bicarbonate: 21.1 mmol/L (ref 20.0–28.0)
O2 Saturation: 99.5 %
Patient temperature: 36
pCO2, Ven: 40 mmHg — ABNORMAL LOW (ref 44–60)
pH, Ven: 7.32 (ref 7.25–7.43)
pO2, Ven: 123 mmHg — ABNORMAL HIGH (ref 32–45)

## 2022-07-11 LAB — PHOSPHORUS: Phosphorus: 5.6 mg/dL — ABNORMAL HIGH (ref 2.5–4.6)

## 2022-07-11 LAB — MASSIVE TRANSFUSION PROTOCOL ORDER (BLOOD BANK NOTIFICATION)

## 2022-07-11 LAB — TRIGLYCERIDES: Triglycerides: 75 mg/dL (ref <150)

## 2022-07-11 MED ORDER — SODIUM CHLORIDE (PF) 0.9 % IJ SOLN
INTRAMUSCULAR | Status: AC
  Filled 2022-07-11: qty 50

## 2022-07-11 MED ORDER — ORAL CARE MOUTH RINSE: 15.0000 mL | OROMUCOSAL | Status: DC | PRN

## 2022-07-11 MED ORDER — PHENYLEPHRINE CONCENTRATED 100MG/250ML (0.4 MG/ML) INFUSION SIMPLE
0.0000 ug/min | INTRAVENOUS | Status: DC
  Administered 2022-07-11: 260 ug/min via INTRAVENOUS
  Administered 2022-07-11: 330 ug/min via INTRAVENOUS
  Administered 2022-07-12: 20 ug/min via INTRAVENOUS
  Filled 2022-07-11 (×5): qty 250

## 2022-07-11 MED ORDER — ALBUMIN HUMAN 25 % IV SOLN
50.0000 g | Freq: Once | INTRAVENOUS | Status: AC
  Administered 2022-07-11: 50 g via INTRAVENOUS
  Filled 2022-07-11: qty 200

## 2022-07-11 MED ORDER — NOREPINEPHRINE 16 MG/250ML-% IV SOLN
0.0000 ug/min | INTRAVENOUS | Status: DC
  Administered 2022-07-11: 32 ug/min via INTRAVENOUS
  Administered 2022-07-12: 37 ug/min via INTRAVENOUS
  Administered 2022-07-12: 15 ug/min via INTRAVENOUS
  Administered 2022-07-13: 12 ug/min via INTRAVENOUS
  Administered 2022-07-16: 3 ug/min via INTRAVENOUS
  Filled 2022-07-11 (×6): qty 250

## 2022-07-11 MED ORDER — MAGNESIUM SULFATE 2 GM/50ML IV SOLN
2.0000 g | Freq: Once | INTRAVENOUS | Status: AC
  Administered 2022-07-11: 2 g via INTRAVENOUS
  Filled 2022-07-11: qty 50

## 2022-07-11 MED ORDER — MIDAZOLAM HCL 2 MG/2ML IJ SOLN
2.0000 mg | INTRAMUSCULAR | Status: DC | PRN
  Administered 2022-07-11 – 2022-07-14 (×10): 2 mg via INTRAVENOUS
  Filled 2022-07-11 (×12): qty 2

## 2022-07-11 MED ORDER — IOHEXOL 350 MG/ML SOLN
75.0000 mL | Freq: Once | INTRAVENOUS | Status: AC | PRN
  Administered 2022-07-11: 75 mL via INTRAVENOUS

## 2022-07-11 MED ORDER — NOREPINEPHRINE 4 MG/250ML-% IV SOLN
0.0000 ug/min | INTRAVENOUS | Status: DC
  Administered 2022-07-11: 2 ug/min via INTRAVENOUS
  Administered 2022-07-11: 13 ug/min via INTRAVENOUS
  Filled 2022-07-11 (×2): qty 250

## 2022-07-11 MED ORDER — ORAL CARE MOUTH RINSE
15.0000 mL | OROMUCOSAL | Status: DC
  Administered 2022-07-11 – 2022-07-15 (×56): 15 mL via OROMUCOSAL

## 2022-07-11 MED ORDER — CHLORHEXIDINE GLUCONATE CLOTH 2 % EX PADS
6.0000 | MEDICATED_PAD | Freq: Every day | CUTANEOUS | Status: DC
  Administered 2022-07-12 – 2022-07-18 (×8): 6 via TOPICAL

## 2022-07-11 NOTE — TOC Initial Note (Signed)
Transition of Care [facility]) - Initial/Assessment Note    Patient Details  Name: Hannah Blake MRN: [id number] Date of Birth: 1951/01/28  Transition of Care [facility]) CM/SW Contact:    Lavenia Atlas, RN Phone Number: 07/11/2022, 6:14 PM  Clinical Narrative:    Received TOC consult for medication assistance. Spoke with patient's POA/grandson San who reports patient had difficulty affording 2 prescriptions on last discharge (pantoprazole and unsure of the other medication name). Per chart review patient has Medicare Part A only. POA reports patient has family planning Medicaid. This RNCM notified [facility] internal financial department to get assistance with possible adult Medicaid application, as patient does not have any medication insurance coverage. Will review for possible MATCH assistance.   TOC will continue to follow.                    Barriers to Discharge: Continued Medical Work up   Patient Goals and CMS Choice            Expected Discharge Plan and Services In-house Referral: NA Discharge Planning Services: CM Consult   Living arrangements for the past 2 months: Single Family Home                 DME Arranged: N/A DME Agency: NA       HH Arranged: NA HH Agency: NA        Prior Living Arrangements/Services Living arrangements for the past 2 months: Single Family Home Lives with:: Adult Children Patient language and need for interpreter reviewed:: Yes Do you feel safe going back to the place where you live?: Yes      Need for Family Participation in Patient Care: No (Comment) Care giver support system in place?: Yes (comment) Current home services: Other (comment) (none) Criminal Activity/Legal Involvement Pertinent to Current Situation/Hospitalization: No - Comment as needed  Activities of Daily Living Home Assistive Devices/Equipment: None ADL Screening (condition at time of admission) Patient's cognitive ability adequate to safely complete daily activities?:  Yes Is the patient deaf or have difficulty hearing?: Yes Does the patient have difficulty seeing, even when wearing glasses/contacts?: Yes Does the patient have difficulty concentrating, remembering, or making decisions?: Yes Patient able to express need for assistance with ADLs?: Yes Does the patient have difficulty dressing or bathing?: Yes Independently performs ADLs?: No Communication: Appropriate for developmental age Dressing (OT): Independent with device (comment) Is this a change from baseline?: Pre-admission baseline Grooming: Needs assistance Is this a change from baseline?: Pre-admission baseline Feeding: Independent Bathing: Needs assistance Is this a change from baseline?: Pre-admission baseline Toileting: Independent In/Out Bed: Independent Walks in Home: Independent Does the patient have difficulty walking or climbing stairs?: Yes Weakness of Legs: Both Weakness of Arms/Hands: Both  Permission Sought/Granted Permission sought to share information with : Case Manager Permission granted to share information with : Yes, Verbal Permission Granted  Share Information with NAME: Case Manager     Permission granted to share info w Relationship: Cherene Altes (grandson/POA)     Emotional Assessment Appearance:: Appears stated age Attitude/Demeanor/Rapport: Gracious Affect (typically observed): Accepting Orientation: : Oriented to Self, Oriented to Place, Oriented to  Time Alcohol / Substance Use: Other (comment) Psych Involvement: No (comment)  Admission diagnosis:  Hemorrhagic shock (HCC) [R57.8] Symptomatic anemia [D64.9] Patient Active Problem List   Diagnosis Date Noted   Hemorrhagic shock (HCC) 07/10/2022   Chronic systolic CHF (congestive heart failure) (HCC) 07/07/2022   Iron deficiency anemia due to chronic blood loss 07/07/2022  Acute upper GI bleeding 07/05/2022   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(HCC) 05/02/2022   Anemia of chronic disease 05/02/2022   GERD  (gastroesophageal reflux disease) 05/02/2022   Esophageal varices in alcoholic cirrhosis (HCC) 01/15/2022   Multiple gastric ulcers 01/15/2022   Goals of care, counseling/discussion 01/15/2022   Heart failure with reduced ejection fraction (HCC) 01/15/2022   Hepatic cirrhosis (HCC) 01/13/2022   Protein-calorie malnutrition, severe 05/21/2021   PCP:  Rocky Morel, DO Pharmacy:   [id number] - [place], [place] - [address] [place] [address] Phone: [phone] Fax: [phone]  [place] - [facility] [address] [place] [address] Phone: [phone] Fax: [phone]  [place] - [facility] [address] [place] [address] Phone: [phone] Fax: [phone]  [facility] [address] [place] - [address] [place] [address] Phone: [phone] Fax: [phone]     Social Determinants of Health (SDOH) Social History: SDOH Screenings   Food Insecurity: No Food Insecurity (07/11/2022)  Housing: Low Risk  (07/10/2022)  Transportation Needs: No Transportation Needs (07/11/2022)  Utilities: Not At Risk (07/11/2022)  Depression (PHQ2-9): Low Risk  (05/27/2021)  Tobacco Use: High Risk (07/11/2022)   SDOH Interventions: Housing Interventions: Intervention Not Indicated   Readmission Risk Interventions     No data to display

## 2022-07-11 NOTE — Plan of Care (Signed)
  Problem: Education: Goal: Knowledge of General Education information will improve Description: Including pain rating scale, medication(s)/side effects and non-pharmacologic comfort measures Outcome: Not Progressing   Problem: Health Behavior/Discharge Planning: Goal: Ability to manage health-related needs will improve Outcome: Not Progressing   Problem: Clinical Measurements: Goal: Ability to maintain clinical measurements within normal limits will improve Outcome: Not Progressing Goal: Will remain free from infection Outcome: Not Progressing Goal: Diagnostic test results will improve Outcome: Not Progressing Goal: Respiratory complications will improve Outcome: Not Progressing Goal: Cardiovascular complication will be avoided Outcome: Not Progressing   Problem: Activity: Goal: Risk for activity intolerance will decrease Outcome: Not Progressing   Problem: Nutrition: Goal: Adequate nutrition will be maintained Outcome: Not Progressing   Problem: Coping: Goal: Level of anxiety will decrease Outcome: Not Progressing   Problem: Elimination: Goal: Will not experience complications related to bowel motility Outcome: Not Progressing Goal: Will not experience complications related to urinary retention Outcome: Not Progressing   Problem: Pain Managment: Goal: General experience of comfort will improve Outcome: Not Progressing   Problem: Safety: Goal: Ability to remain free from injury will improve Outcome: Not Progressing   Problem: Skin Integrity: Goal: Risk for impaired skin integrity will decrease Outcome: Not Progressing   Problem: Education: Goal: Ability to identify signs and symptoms of gastrointestinal bleeding will improve Outcome: Not Progressing   Problem: Bowel/Gastric: Goal: Will show no signs and symptoms of gastrointestinal bleeding Outcome: Not Progressing   Problem: Fluid Volume: Goal: Will show no signs and symptoms of excessive  bleeding Outcome: Not Progressing   Problem: Clinical Measurements: Goal: Complications related to the disease process, condition or treatment will be avoided or minimized Outcome: Not Progressing   

## 2022-07-11 NOTE — Procedures (Signed)
Arterial Catheter Insertion Procedure Note  Hannah Blake  [id number]  [birth year]  Date:07/11/22  Time:12:21 AM    Provider Performing: Omar Person    Procedure: Insertion of Arterial Line (05397) with US guidance (67341)   Indication(s) Blood pressure monitoring and/or need for frequent ABGs  Consent Unable to obtain consent due to emergent nature of procedure.  Anesthesia None   Time Out Verified patient identification, verified procedure, site/side was marked, verified correct patient position, special equipment/implants available, medications/allergies/relevant history reviewed, required imaging and test results available.   Sterile Technique Maximal sterile technique including full sterile barrier drape, hand hygiene, sterile gown, sterile gloves, mask, hair covering, sterile ultrasound probe cover (if used).   Procedure Description Area of catheter insertion was cleaned with chlorhexidine and draped in sterile fashion. With real-time ultrasound guidance an arterial catheter was placed into the right femoral artery.  Appropriate arterial tracings confirmed on monitor.      Complications/Tolerance None; patient tolerated the procedure well.   EBL Minimal   Specimen(s) None

## 2022-07-11 NOTE — Progress Notes (Addendum)
2000 patient arrived from ED alert to self and placed limited english grandson at bedside 2 units blood patient in massive blood transfusion on arrival unable to scan blood products infusing pressors started BP low, 3 units FFP, Vit K given and started started Sandostatin gtt. Warming blanket placed on patient rectal temp 95.6 2030 patient having bright red bloody stools  2115 patient started vomiting bright red blood with clots GI paged and notified of the events happening since arriving to the floor. IV ultrasound placed for pressors by IV team. 2200 patient continues to vomit blood and have bloody stools, Grands son at bedside agreeable for intubation central line placement and endoscopy  2230 patient tubed 2300 CVC placed in left fem BP dropping heart rate over 140s levo changed to NEO Grandson has left bedside emergent a-line placed in right fem. 2 more units blood given with 1 platelets given emergent was able to scan these products    07/11/22  0030  Bedside Endoscopy completed no much blood and clots unable to find source bleeding 0200 patient taken to have CT angio 0300 more bleeding from rectum and orally at least 3 times, CBC sent, Foley placed. Prop started patient moving in bed shaking head and arms PRN fentanyl given also.

## 2022-07-11 NOTE — Progress Notes (Signed)
Emergent bedside EGD performed after patient was given Reglan 10 mg IV X1 and erythromycin 1000 mg IV X 1.  Findings: Red blood noted in esophagus without evidence of esophageal varices. Large amount of clots and fresh blood in entire stomach. Mucosal visualization not possible. Blood in duodenum. Retroflexion showed large clots. The gastric body, fundus, cardia, antrum could not be evaluated.  Recommendations:  STAT CT angio of abdomen, perhaps IR can help localize site of bleeding and intervene. Poor prognosis. Continue supportive management. If patient stabilizes, consider repeat EGD depending on clinical status. Discussed with CCM attending Dr.Nate Meier.

## 2022-07-11 NOTE — Progress Notes (Signed)
  Interdisciplinary Goals of Care Family Meeting   Date carried out:: 07/11/2022  Location of the meeting: Bedside  Member's involved: Physician, Bedside Registered Nurse, and Family Member or next of kin  Durable Power of Attorney or acting medical decision maker: Hannah Blake    Discussion: We discussed goals of care for Hannah Blake. Ongoing hemorrhagic shock on pressor, unable to localize source of bleeding. Discussed code status - despite low likelihood of recovery if she sustained cardiac arrest in this state he believes she would want to remain full code. He says there aren't many people in their family here and it would be meaningful to them to have any opportunity possible to gather everyone to see while she's still alive if something like that happened.  Code status: Full Code  Disposition: Continue current acute care   Time spent for the meeting: 10 minutes  Hannah Blake 07/11/2022, 12:59 AM

## 2022-07-11 NOTE — Progress Notes (Addendum)
eLink Physician-Brief Progress Note Patient Name: Hannah Blake DOB: 1950-10-07 MRN: [id number]   Date of Service  07/11/2022  HPI/Events of Note  Notified by RT of ABG results - 7.18/61/127 on 360, rate 15, 50%, PEEP 5.   Received request for foley cath placement.  eICU Interventions  Increase RR to 20.  Follow ABG. Ok to insert foley cath.     Intervention Category Intermediate Interventions: Other:  [name] 07/11/2022, 1:16 AM

## 2022-07-11 NOTE — Progress Notes (Addendum)
Brief PCCM Progress Note  Intubated electively for emergent EGD, had ongoing melenic stools afterward and with OG placement had brisk return of bloody gastric contents. Profound shock with levo briefly titrated up to 40, central line placed, arterial line placed. 2U pRBC, 1U apheresis platelets, 2g Ca gluconate ordered, and switching to neo/vaso in setting of tachycardia. GI unable to localize site of bleeding with EGD but did note large amount of clot/fresh blood in stomach. CTA A/P ordered, will notify IR as soon as resulted.  CC time 30 minutes  Laroy Apple Pulmonary/Critical Care

## 2022-07-11 NOTE — Progress Notes (Signed)
Subjective: Less blood per rectum overnight.  Objective: Vital signs in last 24 hours: Temp:  [95.1 F (35.1 C)-99.5 F (37.5 C)] 99 F (37.2 C) (12/22 1000) Pulse Rate:  [52-165] 95 (12/22 1000) Resp:  [0-29] 18 (12/22 1000) BP: (40-179)/(28-98) 90/54 (12/22 1000) SpO2:  [94 %-100 %] 100 % (12/22 1000) Arterial Line BP: (59-284)/(35-86) 88/45 (12/22 1000) FiO2 (%):  [40 %-50 %] 40 % (12/22 0801) Weight:  [45.6 kg-49.8 kg] 49.8 kg (12/22 0454) Weight change:  Last BM Date : 07/11/22  PE: GEN:  Intubated, sedated HEENT:  Icteric  Lab Results: CBC    Component Value Date/Time   WBC 30.6 (H) 07/11/2022 0455   RBC 3.90 07/11/2022 0455   HGB 11.3 (L) 07/11/2022 0455   HGB 8.0 (L) 05/27/2021 1504   HCT 33.5 (L) 07/11/2022 0455   HCT 22.7 (L) 05/27/2021 1504   PLT 151 07/11/2022 0455   PLT 221 05/27/2021 1504   MCV 85.9 07/11/2022 0455   MCV 88 05/27/2021 1504   MCH 29.0 07/11/2022 0455   MCHC 33.7 07/11/2022 0455   RDW 19.6 (H) 07/11/2022 0455   RDW 15.8 (H) 05/27/2021 1504   LYMPHSABS 1.8 07/10/2022 1713   LYMPHSABS 2.5 05/27/2021 1504   MONOABS 1.8 (H) 07/10/2022 1713   EOSABS 0.1 07/10/2022 1713   EOSABS 0.3 05/27/2021 1504   BASOSABS 0.1 07/10/2022 1713   BASOSABS 0.1 05/27/2021 1504   CMP     Component Value Date/Time   NA 137 07/11/2022 0455   NA 132 (L) 05/27/2021 1504   K 4.0 07/11/2022 0455   CL 111 07/11/2022 0455   CO2 20 (L) 07/11/2022 0455   GLUCOSE 133 (H) 07/11/2022 0455   BUN 18 07/11/2022 0455   BUN 20 05/27/2021 1504   CREATININE 0.94 07/11/2022 0455   CALCIUM 6.7 (L) 07/11/2022 0455   PROT 5.0 (L) 07/10/2022 2141   PROT 8.0 05/27/2021 1504   ALBUMIN 1.9 (L) 07/10/2022 2141   ALBUMIN 2.4 (L) 05/27/2021 1504   AST 83 (H) 07/10/2022 2141   ALT 39 07/10/2022 2141   ALKPHOS 171 (H) 07/10/2022 2141   BILITOT 3.1 (H) 07/10/2022 2141   BILITOT 5.2 (H) 05/27/2021 1504   GFRNONAA >60 07/11/2022 0455   Assessment:   Unstable upper GI bleed.   Know prior gastric and duodenal ulcers.  Endoscopy last night no bleeding source seen (stomach full of blood).  CTA negative for bleed localization. Acute blood loss anemia. Cirrhosis, primary biliary cholangitis.  Plan:   Intubated, sedated. PPI gtt. Serial CBCs, transfuse as needed. If massive rebleed over next 24 hours, would repeat CT angiogram. Would likely benefit ultimately from repeat endoscopy to reassess, but endoscopy now, less than 12 hours after EGD for same reason with stomach full of blood, will be very low yield. [name] will follow.   Freddy Jaksch 07/11/2022, 10:22 AM   Cell [phone] If no answer or after 5 PM call [phone]

## 2022-07-11 NOTE — Procedures (Signed)
Central Venous Catheter Insertion Procedure Note  Hannah Blake  [id number]  [birth year]  Date:07/11/22  Time:12:22 AM   Provider Performing:Vihan Santagata Judie Petit Thora Lance   Procedure: Insertion of Non-tunneled Central Venous [phone]) with US guidance (75300)   Indication(s) Medication administration  Consent Risks of the procedure as well as the alternatives and risks of each were explained to the patient and/or caregiver.  Consent for the procedure was obtained and is signed in the bedside chart  Anesthesia Topical only with 1% lidocaine   Timeout Verified patient identification, verified procedure, site/side was marked, verified correct patient position, special equipment/implants available, medications/allergies/relevant history reviewed, required imaging and test results available.  Sterile Technique Maximal sterile technique including full sterile barrier drape, hand hygiene, sterile gown, sterile gloves, mask, hair covering, sterile ultrasound probe cover (if used).  Procedure Description Area of catheter insertion was cleaned with chlorhexidine and draped in sterile fashion.  With real-time ultrasound guidance a central venous catheter was placed into the left femoral vein. Nonpulsatile blood flow and easy flushing noted in all ports.  The catheter was sutured in place and sterile dressing applied.  Complications/Tolerance None; patient tolerated the procedure well. Chest X-ray is ordered to verify placement for internal jugular or subclavian cannulation.   Chest x-ray is not ordered for femoral cannulation.  EBL Minimal  Specimen(s) None

## 2022-07-11 NOTE — Progress Notes (Signed)
Initial Nutrition Assessment  INTERVENTION:   -Depending on GOC and ability to extubate, recommend TPN if remains full code.  -Will monitor plan of care  NUTRITION DIAGNOSIS:   Inadequate oral intake related to inability to eat, acute illness (GIB) as evidenced by NPO status.  GOAL:   Patient will meet greater than or equal to 90% of their needs  MONITOR:   Vent status, Labs, Weight trends, I & O's, GOC  REASON FOR ASSESSMENT:   Ventilator    ASSESSMENT:   71 y.o. woman with history of PBC and cirrhosis complicated by esophageal varices and multiple GI bleeds in the past with recent EGD with esophageal ulcers with stigmata of recent bleeding as well as scattered gastric ulcers during admission for acute blood loss anemia presents with hematochezia and hemorrhagic shock now on pressors and intubated.  12/21: admitted, s/p EGD 12/22: intubated  Per GI note, EGD on 12/21 showed large amounts of blood in stomach. Blood was also noted in esophagus and duodenum. Pt with poor prognosis.  Per CCM note today, pt with increasing pressor support. Family has opted to remain full code. Pt with grave prognosis.  NGT in place, set to suction, 1300 ml of bloody output.  Patient is currently intubated on ventilator support MV: 6.9 L/min Temp (24hrs), Avg:98.8 F (37.1 C), Min:95.1 F (35.1 C), Max:99.9 F (37.7 C)  Propofol: 6.84 ml/hr -> provides ~180 fat kcals  Per weight records, pt weighed ~100 lbs on 12/19. Pt currently weighs 109 lbs.   Medications: Octreotide, Colace, Miralax, IV Mg sulfate, Levophed, Phenylephrine, Vasopressin  Labs reviewed: CBGs: 120 Elevated Phos   NUTRITION - FOCUSED PHYSICAL EXAM:  Deferred   Diet Order:   Diet Order             Diet NPO time specified  Diet effective now                   EDUCATION NEEDS:   Not appropriate for education at this time  Skin:  Skin Assessment: Reviewed RN Assessment  Last BM:  12/22 -type 7,  bloody  Height:   Ht Readings from Last 1 Encounters:  07/11/22 5' (1.524 m)    Weight:   Wt Readings from Last 1 Encounters:  07/11/22 49.8 kg    BMI:  Body mass index is 21.44 kg/m.  Estimated Nutritional Needs:   Kcal:  1500-1700  Protein:  75-90g  Fluid:  1.7L/day  Tilda Franco, MS, RD, LDN Inpatient Clinical Dietitian Contact information available via Amion

## 2022-07-11 NOTE — Op Note (Signed)
[facility] Patient Name: Hannah Blake Procedure Date: 07/10/2022 MRN: [id number] Attending MD: Kerin Salen , MD, [id number] Date of Birth: [birth year] CSN: [id number] Age: 71 Admit Type: Inpatient Procedure:                Upper GI endoscopy Indications:              Hematemesis, Hematochezia, Melena Providers:                Kerin Salen, MD, Doree Albee, RN, [facility]                            Technician, Technician Referring MD:             CCM/ICU Medicines:                IV propofol as per ICU team/CCM Complications:            No immediate complications. Estimated Blood Loss:     Estimated blood loss: none. Procedure:                Pre-Anesthesia Assessment:                           - Prior to the procedure, a History and Physical                            was performed, and patient medications and                            allergies were reviewed. The patient's tolerance of                            previous anesthesia was also reviewed. The risks                            and benefits of the procedure and the sedation                            options and risks were discussed with the patient.                            All questions were answered, and informed consent                            was obtained. Prior Anticoagulants: The patient has                            taken no anticoagulant or antiplatelet agents. ASA                            Grade Assessment: E - Emergency. After reviewing                            the risks and benefits, the patient was deemed in  satisfactory condition to undergo the procedure.                           After obtaining informed consent, the endoscope was                            passed under direct vision. Throughout the                            procedure, the patient's blood pressure, pulse, and                            oxygen saturations were monitored continuously. The                             [id number]-H190 ([id number]) Olympus endoscope was introduced                            through the mouth, and advanced to the second part                            of duodenum. The upper GI endoscopy was performed                            with moderate difficulty due to active bleeding.                            The patient tolerated the procedure well. Scope In: Scope Out: Findings:      Red blood was found in the upper third of the esophagus, in the middle       third of the esophagus and in the lower third of the esophagus.      Clotted blood was found in the entire examined stomach.      Red blood was found in the entire duodenum.      Large clots were noted in the entire stomach which made mucosal       visualization impossible.      The cardia, fundus, body, antrum or curvatures could not be examined.      There were no obvious esophageal varices noted. Impression:               - Red blood in the lower third of the esophagus, in                            the upper third of the esophagus and in the middle                            third of the esophagus.                           - Clotted blood in the entire stomach.                           - Blood in the entire examined duodenum.                           -  No specimens collected. Moderate Sedation:      Patient did not receive moderate sedation for this procedure, but       instead received monitored anesthesia care. Recommendation:           - Perform CT angio of the abdomen with contrast                            today, if site of GI bleeding is identified,                            perhaps IR can help with embolization.                           - Continue supportive management with blood                            products, PPI.                           - Repeat EGD depending on patient's clincal                            outcome/status. Procedure Code(s):        --- Professional ---                            [phone], Esophagogastroduodenoscopy, flexible,                            transoral; diagnostic, including collection of                            specimen(s) by brushing or washing, when performed                            (separate procedure) Diagnosis Code(s):        --- Professional ---                           K22.89, Other specified disease of esophagus                           K92.2, Gastrointestinal hemorrhage, unspecified                           K92.0, Hematemesis                           K92.1, Melena (includes Hematochezia) CPT copyright 2022 [employer]. All rights reserved. The codes documented in this report are preliminary and upon coder review may  be revised to meet current compliance requirements. Kerin Salen, MD 07/11/2022 12:27:58 AM This report has been signed electronically. Number of Addenda: 0

## 2022-07-11 NOTE — Progress Notes (Addendum)
NAME:  Hannah Blake, MRN:  [id number], DOB:  [birth year], LOS: 1 ADMISSION DATE:  07/10/2022, CONSULTATION DATE: 12/21 REFERRING MD: Dr. Rush Landmark, CHIEF COMPLAINT: Blood in stool   History of Present Illness:  71 y/o F who presented to [facility] ER with reports of bloody stools.   The patient was admitted from 12/16-12/20/23 for hematemesis.  She was admitted with shock in the setting of GIB.  She required 4 units PRBC.  GI consulted and EGD completed showed ulcerations in the esophagus, stomach and duodenum with clots noted throughout the stomach. No significant varices on EGD noted. GI was concerned for possible NSAID use PTA. She required vasopressors, PPI infusion.  Rec's at discharge were for PPI BID x3 months then indefinetly, ursodiol and close follow up. Per chart review, grandson went to pick up ursodiol and it was 550$, and was unable to pick up.  She returns on 12/21 with reports of getting dizzy while having a bowel movement. Family noted blood in toilet. EMS was activated.  Initial evaluation noted her to have a blood pressure of 60/30.  On arrival to ER, RA saturations 83% and was placed on a NRB. Initial labs notable for Hgb 4.9, WBC 34.2, platelets 197. Temp 95.1. Other labs pending.   PCCM called for ICU admission.   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  Intubated, EGD with blood/clot in stomach but unable to visualize area of bleeding. On pressors. 6 units PRBC, 1 pk platelets 12/22 CTA ABD/Pelvis with no findings suspicious for active GIB, distended stomach with suspected intraluminal hematoma, wall thickening of transverse, descending/rectosigmoid colon, moderate abdominopelvic ascites,  cirrhosis.  Interim History / Subjective:  Afebrile - on/off bair hugger overnight.  WBC 30.6  Glucose range 94-133  I/O UOP , 1.3L emesis, +4L in last 24 hours  RN reports ongoing bloody drainage from gastric tube, escalating pressor needs  Objective   Blood pressure (!) 88/54, pulse 93, temperature 98.8 F (37.1 C), resp. rate (!) 22, height 5' (1.524 m), weight 49.8 kg, SpO2 100 %.    Vent Mode: PRVC FiO2 (%):  [40 %-50 %] 40 % Set Rate:  [16 bmp-20 bmp] 20 bmp Vt Set:  [350 mL-360 mL] 360 mL PEEP:  [5 cmH20] 5 cmH20 Plateau Pressure:  [12 cmH20-15 cmH20] 13 cmH20   Intake/Output Summary (Last 24 hours) at 07/11/2022 0841 Last data filed at 07/11/2022 0700 Gross per 24 hour  Intake 6064.94 ml  Output 2076 ml  Net 3988.94 ml   Filed Weights   07/10/22 2000 07/11/22 0454  Weight: 45.6 kg 49.8 kg    Examination: General: critically ill appearing adult female lying in bed in NAD on vent HEENT: MM pink/moist, NGT with bright red bloody drainage, left eye opaque, right pupil 65mm, icterus  Neuro: sedate on propofol CV: s1s2 RRR, SR on monitor, no m/r/g PULM: non-labored at rest, lungs bilaterally clear  GI: mildly distended compared to prior exam, bsx4 hypoactive  Extremities: warm/dry, no edema  Skin: no rashes or lesions Access: R fem Aline, L fem TLC   Resolved Hospital Problem list      Assessment & Plan:   Hemorrhagic Shock due to GIB with ABLA Known Esophageal Ulcers  ABLA, Symptomatic Anemia  Near Syncope secondary to Above Discharged 12/20, Hgb 8.3 on admit.  Most recent scope with ulcerations concerning for possible NSAID use.  No evidence of varices. S/p MTP protocol with 6 units PRBC, 1 pk platelets 12/21. S/p Vit K.  -neosynephrine for SBP 90 -CBC at 1pm and in am  -appreciate GI assistance with patient care  -BID PPI -octreotide infusion  -NGT LIS, monitor volume  -empiric rocephin for SBP  -give additional gram calcium   -repeat DIC panel at  1pm  Acute Hypoxic Respiratory Failure At Risk TACO/TRALI -PRVC 8cc/kg, rate 20. LTVV  -wean PEEP / FiO2 for sats >90% -follow intermittent CXR  -daily SBT / WUA once stable from GI standpoint  Leukocytosis  -continue empiric ceftriaxone  -likely acute phase reactant vs SBP   Primary Biliary Cholangitis + ETOH Abuse Hepatic Cirrhosis with Ascites GERD -per GI   Acute Metabolic Encephalopathy secondary to Hypotension, ABLA  -PAD protocol for sedation  -RASS Goal 0 to -1   At Risk Hypoglycemia in setting of Liver Disease -follow CBG  Chronic HFpEF  -follow I/O  -reassess volume status once stable   Best Practice (right click and "Reselect all SmartList Selections" daily)  Diet/type: NPO DVT prophylaxis: SCD GI prophylaxis: PPI Lines: Central line and Arterial Line Foley:  Yes, and it is still needed Code Status:  full code Last date of multidisciplinary goals of care discussion: 12/21 - confirmed with grandsons, who indicate they are HCPOA, that she is full code.  Would be accepting of all measures in the event of decline.  See iPAL noted from 12/22 as well, confirms above.   Family updated 12/22 per Dr. Judeth Horn.   Critical care time: 36 minutes     Canary Brim, MSN, APRN, NP-C, AGACNP-BC [facility] 07/11/2022, 8:41 AM   Please see [email or web address] for pager details.   From 7A-7P if no response, please call [phone] After hours, please call ELink [phone]

## 2022-07-11 NOTE — Procedures (Signed)
Intubation Procedure Note  NYSA SARIN  [id number]  06-21-1951  Date:07/11/22  Time:12:20 AM   Provider Performing:Deeksha Cotrell M Thora Lance    Procedure: Intubation (31500)  Indication(s) Respiratory Failure  Consent Risks of the procedure as well as the alternatives and risks of each were explained to the patient and/or caregiver.  Consent for the procedure was obtained and is signed in the bedside chart   Anesthesia Etomidate, Versed, and Rocuronium   Time Out Verified patient identification, verified procedure, site/side was marked, verified correct patient position, special equipment/implants available, medications/allergies/relevant history reviewed, required imaging and test results available.   Sterile Technique Usual hand hygeine, masks, and gloves were used   Procedure Description Patient positioned in bed supine.  Sedation given as noted above.  Patient was intubated with endotracheal tube using Glidescope.  View was Grade 1 full glottis .  Number of attempts was 1.  Colorimetric CO2 detector was consistent with tracheal placement.   Complications/Tolerance None; patient tolerated the procedure well. Chest X-ray is ordered to verify placement.   EBL Minimal   Specimen(s) None

## 2022-07-12 ENCOUNTER — Inpatient Hospital Stay (HOSPITAL_COMMUNITY): Payer: Medicare Other

## 2022-07-12 HISTORY — PX: IR ANGIOGRAM SELECTIVE EACH ADDITIONAL VESSEL: IMG667

## 2022-07-12 HISTORY — PX: IR US GUIDE VASC ACCESS LEFT: IMG2389

## 2022-07-12 HISTORY — PX: IR ANGIOGRAM VISCERAL SELECTIVE: IMG657

## 2022-07-12 HISTORY — PX: IR EMBO ART  VEN HEMORR LYMPH EXTRAV  INC GUIDE ROADMAPPING: IMG5450

## 2022-07-12 HISTORY — PX: IR TRANSCATH/EMBOLIZ: IMG695

## 2022-07-12 LAB — COMPREHENSIVE METABOLIC PANEL
ALT: 113 U/L — ABNORMAL HIGH (ref 0–44)
AST: 262 U/L — ABNORMAL HIGH (ref 15–41)
Albumin: 2.7 g/dL — ABNORMAL LOW (ref 3.5–5.0)
Alkaline Phosphatase: 118 U/L (ref 38–126)
Anion gap: 5 (ref 5–15)
BUN: 29 mg/dL — ABNORMAL HIGH (ref 8–23)
CO2: 21 mmol/L — ABNORMAL LOW (ref 22–32)
Calcium: 6.7 mg/dL — ABNORMAL LOW (ref 8.9–10.3)
Chloride: 110 mmol/L (ref 98–111)
Creatinine, Ser: 1.26 mg/dL — ABNORMAL HIGH (ref 0.44–1.00)
GFR, Estimated: 45 mL/min — ABNORMAL LOW (ref >60)
Glucose, Bld: 152 mg/dL — ABNORMAL HIGH (ref 70–99)
Potassium: 3.9 mmol/L (ref 3.5–5.1)
Sodium: 136 mmol/L (ref 135–145)
Total Bilirubin: 1.8 mg/dL — ABNORMAL HIGH (ref 0.3–1.2)
Total Protein: 4.6 g/dL — ABNORMAL LOW (ref 6.5–8.1)

## 2022-07-12 LAB — DIC (DISSEMINATED INTRAVASCULAR COAGULATION)PANEL
D-Dimer, Quant: 2.31 ug/mL-FEU — ABNORMAL HIGH (ref 0.00–0.50)
Fibrinogen: 155 mg/dL — ABNORMAL LOW (ref 210–475)
INR: 1.4 — ABNORMAL HIGH (ref 0.8–1.2)
Platelets: 127 10*3/uL — ABNORMAL LOW (ref 150–400)
Prothrombin Time: 17.3 seconds — ABNORMAL HIGH (ref 11.4–15.2)
Smear Review: NONE SEEN
aPTT: 42 seconds — ABNORMAL HIGH (ref 24–36)

## 2022-07-12 LAB — CBC
HCT: 17.2 % — ABNORMAL LOW (ref 36.0–46.0)
HCT: 30.2 % — ABNORMAL LOW (ref 36.0–46.0)
Hemoglobin: 5.7 g/dL — CL (ref 12.0–15.0)
Hemoglobin: 10.2 g/dL — ABNORMAL LOW (ref 12.0–15.0)
MCH: 30.2 pg (ref 26.0–34.0)
MCH: 30.5 pg (ref 26.0–34.0)
MCHC: 33.1 g/dL (ref 30.0–36.0)
MCHC: 33.8 g/dL (ref 30.0–36.0)
MCV: 91 fL (ref 80.0–100.0)
MCV: 90.4 fL (ref 80.0–100.0)
Platelets: 136 10*3/uL — ABNORMAL LOW (ref 150–400)
Platelets: 109 10*3/uL — ABNORMAL LOW (ref 150–400)
RBC: 1.89 MIL/uL — ABNORMAL LOW (ref 3.87–5.11)
RBC: 3.34 MIL/uL — ABNORMAL LOW (ref 3.87–5.11)
RDW: 21.9 % — ABNORMAL HIGH (ref 11.5–15.5)
RDW: 17.1 % — ABNORMAL HIGH (ref 11.5–15.5)
WBC: 24.9 10*3/uL — ABNORMAL HIGH (ref 4.0–10.5)
WBC: 24.7 10*3/uL — ABNORMAL HIGH (ref 4.0–10.5)
nRBC: 1.6 % — ABNORMAL HIGH (ref 0.0–0.2)
nRBC: 1.6 % — ABNORMAL HIGH (ref 0.0–0.2)

## 2022-07-12 LAB — PREPARE RBC (CROSSMATCH)

## 2022-07-12 LAB — GLUCOSE, CAPILLARY
Glucose-Capillary: 106 mg/dL — ABNORMAL HIGH (ref 70–99)
Glucose-Capillary: 110 mg/dL — ABNORMAL HIGH (ref 70–99)
Glucose-Capillary: 122 mg/dL — ABNORMAL HIGH (ref 70–99)
Glucose-Capillary: 128 mg/dL — ABNORMAL HIGH (ref 70–99)
Glucose-Capillary: 136 mg/dL — ABNORMAL HIGH (ref 70–99)
Glucose-Capillary: 149 mg/dL — ABNORMAL HIGH (ref 70–99)

## 2022-07-12 LAB — MAGNESIUM: Magnesium: 2.3 mg/dL (ref 1.7–2.4)

## 2022-07-12 MED ORDER — IOHEXOL 300 MG/ML  SOLN
100.0000 mL | Freq: Once | INTRAMUSCULAR | Status: AC | PRN
  Administered 2022-07-12: 80 mL via INTRA_ARTERIAL

## 2022-07-12 MED ORDER — FENTANYL CITRATE (PF) 100 MCG/2ML IJ SOLN
INTRAMUSCULAR | Status: AC
  Filled 2022-07-12: qty 2

## 2022-07-12 MED ORDER — MIDAZOLAM HCL 2 MG/2ML IJ SOLN
INTRAMUSCULAR | Status: AC
  Filled 2022-07-12: qty 2

## 2022-07-12 MED ORDER — ATROPINE SULFATE 1 MG/10ML IJ SOSY
PREFILLED_SYRINGE | INTRAMUSCULAR | Status: AC
  Filled 2022-07-12: qty 10

## 2022-07-12 MED ORDER — CALCIUM GLUCONATE-NACL 2-0.675 GM/100ML-% IV SOLN
2.0000 g | Freq: Once | INTRAVENOUS | Status: AC
  Administered 2022-07-12: 2000 mg via INTRAVENOUS
  Filled 2022-07-12: qty 100

## 2022-07-12 MED ORDER — SODIUM CHLORIDE 0.9% IV SOLUTION: Freq: Once | INTRAVENOUS | Status: AC

## 2022-07-12 MED ORDER — SODIUM CHLORIDE (PF) 0.9 % IJ SOLN
INTRAMUSCULAR | Status: AC
  Filled 2022-07-12: qty 50

## 2022-07-12 MED ORDER — LIDOCAINE HCL 1 % IJ SOLN
INTRAMUSCULAR | Status: AC
  Filled 2022-07-12: qty 20

## 2022-07-12 MED ORDER — EPINEPHRINE 1 MG/10ML IJ SOSY
PREFILLED_SYRINGE | INTRAMUSCULAR | Status: AC
  Filled 2022-07-12: qty 10

## 2022-07-12 MED ORDER — FENTANYL CITRATE (PF) 100 MCG/2ML IJ SOLN
INTRAMUSCULAR | Status: AC | PRN
  Administered 2022-07-12: 25 ug via INTRAVENOUS
  Administered 2022-07-12: 50 ug via INTRAVENOUS
  Administered 2022-07-12: 25 ug via INTRAVENOUS

## 2022-07-12 MED ORDER — MIDAZOLAM HCL 2 MG/2ML IJ SOLN
INTRAMUSCULAR | Status: AC | PRN
  Administered 2022-07-12 (×2): 1 mg via INTRAVENOUS

## 2022-07-12 MED ORDER — IOHEXOL 300 MG/ML  SOLN
100.0000 mL | Freq: Once | INTRAMUSCULAR | Status: AC | PRN
  Administered 2022-07-12: 45 mL via INTRA_ARTERIAL

## 2022-07-12 MED ORDER — FUROSEMIDE 10 MG/ML IJ SOLN
20.0000 mg | Freq: Once | INTRAMUSCULAR | Status: AC
  Administered 2022-07-12: 20 mg via INTRAVENOUS
  Filled 2022-07-12: qty 2

## 2022-07-12 MED ORDER — IOHEXOL 300 MG/ML  SOLN
100.0000 mL | Freq: Once | INTRAMUSCULAR | Status: AC | PRN
  Administered 2022-07-12: 20 mL via INTRA_ARTERIAL

## 2022-07-12 MED ORDER — IOHEXOL 350 MG/ML SOLN
100.0000 mL | Freq: Once | INTRAVENOUS | Status: AC | PRN
  Administered 2022-07-12: 80 mL via INTRAVENOUS

## 2022-07-12 NOTE — H&P (Signed)
Interventional Radiology - Inpatient Consult H&P    Referring Provider (current admission): Hunsucker, Lesia Sago, MD  Reason for Consult: Upper GI bleed     History of Present Illness  Hannah Blake is a 71 y.o. female with a relevant past medical history of cirrhosis seen in consultation for upper GI bleeding.  Recent upper endoscopy showed large blood/clot in the stomach without source identified. CTA obtained, suggesting active bleeding along the medial aspect of the gastric wall.  Patient remains intubated and sedated in the ICU.    Additional Past Medical History 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[facility])     Surgical History  Past Surgical History:  Procedure Laterality Date   BIOPSY  01/14/2022   Procedure: BIOPSY;  Surgeon: Kathi Der, MD;  Location: [facility];  Service: Gastroenterology;;   BIOPSY  07/06/2022   Procedure: BIOPSY;  Surgeon: Kathi Der, MD;  Location: [facility];  Service: Gastroenterology;;   CLAVICLE SURGERY     ESOPHAGEAL BANDING  01/14/2022   Procedure: ESOPHAGEAL BANDING;  Surgeon: Kathi Der, MD;  Location: [facility];  Service: Gastroenterology;;   ESOPHAGOGASTRODUODENOSCOPY N/A 01/14/2022   Procedure: ESOPHAGOGASTRODUODENOSCOPY (EGD);  Surgeon: Kathi Der, MD;  Location: [facility];  Service: Gastroenterology;  Laterality: N/A;   ESOPHAGOGASTRODUODENOSCOPY (EGD) WITH PROPOFOL N/A 07/06/2022   Procedure: ESOPHAGOGASTRODUODENOSCOPY (EGD) WITH PROPOFOL;  Surgeon: Kathi Der, MD;  Location: [facility];  Service: Gastroenterology;  Laterality: N/A;   FRACTURE SURGERY       Medications  I have reviewed the current medication list. Refer to chart for details. Current Outpatient Medications  Medication Instructions   furosemide (LASIX) 20 mg, Oral, Every M-W-F    pantoprazole (PROTONIX) 40 mg, Oral, 2 times daily before meals   spironolactone (ALDACTONE) 50 mg, Oral, Every M-W-F   ursodiol (ACTIGALL) 600 mg, Oral, 2 times daily     Allergies No Known Allergies Does patient have contrast allergy: No     Physical Exam Current Vitals Temp: 99.1 F (37.3 C) (Temp Source: Bladder)  Pulse Rate: (!) 102  Resp: 20  BP: (!) 127/51  SpO2: 100 %  Height: 5' (152.4 cm)  Weight: 50.2 kg  Body mass index is 21.61 kg/m.  General: Sedated  HEENT: ETT Cardiac: Tachycardic  Pulmonary: Mechanically ventilated    Pertinent Lab Results Labs: CBC: WBC/Hgb/Hct/Plts:  --/--/--/127 (12/23 6160)  BMP: BUN/Cr/glu/ALT/AST/amyl/lip:  29/1.26/--/113/262/--/-- (12/23 0127) Coagulation: PT/INR/PTT:  --/1.4/-- (12/23 0838)  CBC Trends: Recent Labs    07/11/22 0455 07/11/22 1123 07/12/22 0249 07/12/22 0838  WBC 30.6* 27.2* 24.9*  --   HGB 11.3* 11.2* 5.7*  --   HCT 33.5* 33.5* 17.2*  --   PLT 151 147*  159 136* 127*     Creatinine Trend: Recent Labs    07/10/22 2141 07/11/22 0455 07/12/22 0127  CREATININE 1.02* 0.94 1.26*     Relevant and/or Recent Imaging: Cta as discussed in HPI    Assessment & Plan Hannah Blake is a 71 y.o. female seen in consultation for upper GI bleed wit history of cirrhosis.   CTA shows likely active arterial bleed along medial border of the gastric wall. This does appear to be in the territory of the GDA or left gastric artery, which are the more common target vessels for intervention. My best estimate is that this  may be coming from the short gastrics from the splenic artery, as this courses close to the site of the bleed.   \Will plan for mesenteric angio, and possible empiric embolization of the splenic artery if a definitve source is not found. Discussed the case, including alternatives/risks/benefits, with the grandson, [place]. Consent obtained over the telephone.    Procedure Checklist:  Consent obtained:  Risks of the procedure as well as the alternatives and risks of each were explained to the patient and/or caregiver.  Consent for the procedure was obtained and is signed in the bedside chart Consent obtained from: [facility]  Patient is appropriate candidate for sedation Yes ASA Classification: ASA 4 - Patient with severe systemic disease that is a constant threat to life NPO status: n/a  Code status:   Code Status: Full Code Pre-procedural prep necessary: none      I spent a total of 55 Miinutes in face-to-face in clinical consultation, greater than 50% of which was spent on medical decision-making and counseling/coordinating care for upper GI bleeding .     Olive Bass, MD  Vascular and Interventional Radiology 07/12/2022 10:26 AM

## 2022-07-12 NOTE — Procedures (Signed)
Interventional Radiology Procedure Note  Date of Procedure: 07/12/2022  Procedure: Mesenteric angiogram   Findings:  1. Mesenteric angiogram with bleeding vessel identified from a branch of the LGA, in proximity to location of bleed seen on CTA. Successful embolization using coils.  2. Left CFA access with AngioSeal closure    Complications: No immediate complications noted.   Estimated Blood Loss: minimal  Follow-up and Recommendations: 1. Bedrest 4 hours    Olive Bass, MD  Vascular & Interventional Radiology  07/12/2022 1:05 PM

## 2022-07-12 NOTE — Progress Notes (Signed)
eLink Physician-Brief Progress Note Patient Name: Hannah Blake DOB: 11-10-1950 MRN: [id number]   Date of Service  07/12/2022  HPI/Events of Note  CVP 13, JVP elevated, patient with progressive sinus tachycardia, patient has a history of CHF and has been aggressively volume resuscitated since admission for GI bleeding. Creatinine 0.94.  eICU Interventions  Drips quad concentrated. Lasix 20 mg iv x 1 ordered.        Thomasene Lot Kaizlee Carlino 07/12/2022, 12:30 AM

## 2022-07-12 NOTE — Progress Notes (Signed)
[facility] Progress Note  SUBJECTIVE:   Interval history: Hannah Blake was seen and evaluated today at bedside.  Patient intubated, sedated, unable to obtain review of systems.  CT angiogram results reviewed, discussed results of CT angiogram and further plan of care with interventional radiology team.  At this time, do not believe that repeat endoscopy would elucidate etiology of upper GI bleeding (given large amount of blood in stomach, this was also present on previous endoscopy).  Interventional radiology to consider intervention for determining/treating bleeding source.  Patient's grandson, [name], at bedside and all questions answered.  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[facility])    Past Surgical History:  Procedure Laterality Date   BIOPSY  01/14/2022   Procedure: BIOPSY;  Surgeon: Kathi Der, MD;  Location: [facility]COPY;  Service: Gastroenterology;;   BIOPSY  07/06/2022   Procedure: BIOPSY;  Surgeon: Kathi Der, MD;  Location: [facility];  Service: Gastroenterology;;   CLAVICLE SURGERY     ESOPHAGEAL BANDING  01/14/2022   Procedure: ESOPHAGEAL BANDING;  Surgeon: Kathi Der, MD;  Location: [facility];  Service: Gastroenterology;;   ESOPHAGOGASTRODUODENOSCOPY N/A 01/14/2022   Procedure: ESOPHAGOGASTRODUODENOSCOPY (EGD);  Surgeon: Kathi Der, MD;  Location: [facility];  Service: Gastroenterology;  Laterality: N/A;   ESOPHAGOGASTRODUODENOSCOPY (EGD) WITH PROPOFOL N/A 07/06/2022   Procedure: ESOPHAGOGASTRODUODENOSCOPY (EGD) WITH PROPOFOL;  Surgeon: Kathi Der, MD;  Location: [facility];  Service: Gastroenterology;  Laterality: N/A;   FRACTURE SURGERY     Current Facility-Administered Medications  Medication Dose Route Frequency Provider Last Rate Last Admin   0.9 %  sodium chloride infusion  (Manually program via Guardrails IV Fluids)   Intravenous Once [name], MD       0.9 %  sodium chloride infusion (Manually program via Guardrails IV Fluids)   Intravenous Once [name], MD       0.9 %  sodium chloride infusion  250 mL Intravenous Continuous Hunsucker, Lesia Sago, MD 20 mL/hr at 07/12/22 0521 250 mL at 07/12/22 0521   atropine 1 MG/10ML injection            cefTRIAXone (ROCEPHIN) 2 g in sodium chloride 0.9 % 100 mL IVPB  2 g Intravenous q1800 Hunsucker, Lesia Sago, MD   Stopped at 07/11/22 1758   Chlorhexidine Gluconate Cloth 2 % PADS 6 each  6 each Topical Daily Hunsucker, Lesia Sago, MD   6 each at 07/12/22 0015   docusate (COLACE) 50 MG/5ML liquid 100 mg  100 mg Per Tube BID Omar Person, MD       docusate sodium (COLACE) capsule 100 mg  100 mg Oral BID PRN Hunsucker, Lesia Sago, MD       EPINEPHrine (ADRENALIN) 1 MG/10ML injection            fentaNYL (SUBLIMAZE) 100 MCG/2ML injection            fentaNYL (SUBLIMAZE) injection 25-100 mcg  25-100 mcg Intravenous Q30 min PRN Omar Person, MD   25 mcg at 07/12/22 0930   fentaNYL (SUBLIMAZE) injection   Intravenous PRN El-Abd, Aaron Edelman, MD   50 mcg at 07/12/22 1218   lidocaine (XYLOCAINE) 1 % (with pres) injection            midazolam (VERSED) 2 MG/2ML injection  midazolam (VERSED) injection 2 mg  2 mg Intravenous Q2H PRN Hunsucker, Bonna Gains, MD   2 mg at 07/12/22 0843   midazolam (VERSED) injection   Intravenous PRN Juliet Rude, MD   1 mg at 07/12/22 1208   norepinephrine (LEVOPHED) 16 mg in 28mL (0.064 mg/mL) premix infusion  0-40 mcg/min Intravenous Titrated Hunsucker, Bonna Gains, MD 20.6 mL/hr at 07/12/22 0900 22 mcg/min at 07/12/22 0900   octreotide (SANDOSTATIN) 500 mcg in sodium chloride 0.9 % 250 mL (2 mcg/mL) infusion  50 mcg/hr Intravenous Continuous Hunsucker, Bonna Gains, MD 25 mL/hr at 07/12/22 0900 50 mcg/hr at 07/12/22 0900   Oral care mouth rinse  15 mL Mouth Rinse Q2H Hunsucker, Bonna Gains, MD   15 mL at 07/12/22 0931   Oral care mouth rinse  15 mL Mouth Rinse PRN Hunsucker, Bonna Gains, MD       pantoprazole (PROTONIX) injection 40 mg  40 mg Intravenous Q12H Hunsucker, Bonna Gains, MD   40 mg at 07/12/22 0931   phenylephrine CONCENTRATED 100mg  in sodium chloride 0.9% 262mL (0.4mg /mL) premix infusion  0-400 mcg/min Intravenous Titrated Ollis, Brandi L, NP 3 mL/hr at 07/12/22 0900 20 mcg/min at 07/12/22 0900   polyethylene glycol (MIRALAX / GLYCOLAX) packet 17 g  17 g Oral Daily PRN Hunsucker, Bonna Gains, MD       polyethylene glycol (MIRALAX / GLYCOLAX) packet 17 g  17 g Per Tube Daily Maryjane Hurter, MD       propofol (DIPRIVAN) 1000 MG/100ML infusion  0-50 mcg/kg/min Intravenous Continuous Maryjane Hurter, MD 10.94 mL/hr at 07/12/22 1002 40 mcg/kg/min at 07/12/22 1002   vasopressin (PITRESSIN) 20 Units in sodium chloride 0.9 % 100 mL infusion-*FOR SHOCK*  0.03 Units/min Intravenous Continuous Maryjane Hurter, MD 9 mL/hr at 07/12/22 0900 0.03 Units/min at 07/12/22 0900   Allergies as of 07/10/2022   (No Known Allergies)   Review of Systems:  Review of Systems  Reason unable to perform ROS: Intubated, sedated.    OBJECTIVE:   Temp:  [98.8 F (37.1 C)-100.2 F (37.9 C)] 99 F (37.2 C) (12/23 1032) Pulse Rate:  [82-136] 90 (12/23 1235) Resp:  [0-36] 20 (12/23 1235) BP: (70-143)/(37-67) 135/57 (12/23 1235) SpO2:  [82 %-100 %] 100 % (12/23 1235) Arterial Line BP: (75-171)/(31-76) 127/53 (12/23 1032) FiO2 (%):  [40 %] 40 % (12/23 0827) Weight:  [50.2 kg] 50.2 kg (12/23 0408) Last BM Date : 07/11/22 Physical Exam Constitutional:      Interventions: She is sedated and intubated.  HENT:     Mouth/Throat:     Comments: Orogastric tube with frank blood.  Frank blood also noted from mouth. Cardiovascular:     Rate and Rhythm: Regular rhythm. Tachycardia present.  Pulmonary:     Effort: She is intubated.     Comments: Anterior lung fields clear to  auscultation Abdominal:     Palpations: Abdomen is soft.     Comments: Faint bowel sounds.  Musculoskeletal:     Right lower leg: No edema.     Labs: Recent Labs    07/11/22 0455 07/11/22 1123 07/12/22 0249 07/12/22 0838  WBC 30.6* 27.2* 24.9*  --   HGB 11.3* 11.2* 5.7*  --   HCT 33.5* 33.5* 17.2*  --   PLT 151 147*  159 136* 127*   BMET Recent Labs    07/10/22 2141 07/11/22 0455 07/12/22 0127  NA 138 137 136  K 4.3 4.0 3.9  CL 107 111 110  CO2 20* 20* 21*  GLUCOSE 133* 133* 152*  BUN 18 18 29*  CREATININE 1.02* 0.94 1.26*  CALCIUM 7.0* 6.7* 6.7*   LFT Recent Labs    07/12/22 0127  PROT 4.6*  ALBUMIN 2.7*  AST 262*  ALT 113*  ALKPHOS 118  BILITOT 1.8*   PT/INR Recent Labs    07/11/22 1123 07/12/22 0838  LABPROT 17.1* 17.3*  INR 1.4* 1.4*   Diagnostic imaging: CT ANGIO GI BLEED  Addendum Date: 07/12/2022   ADDENDUM REPORT: 07/12/2022 07:27 ADDENDUM: Critical Value/emergent results were called by telephone at the time of interpretation on 07/12/2022 at 7:26 am to provider Dr. Silas Flood, who verbally acknowledged these results. Electronically Signed   By: Kerby Moors M.D.   On: 07/12/2022 07:27   Result Date: 07/12/2022 CLINICAL DATA:  Evaluate for GI bleed. EXAM: CTA ABDOMEN AND PELVIS WITHOUT AND WITH CONTRAST TECHNIQUE: Multidetector CT imaging of the abdomen and pelvis was performed using the standard protocol during bolus administration of intravenous contrast. Multiplanar reconstructed images and MIPs were obtained and reviewed to evaluate the vascular anatomy. RADIATION DOSE REDUCTION: This exam was performed according to the departmental dose-optimization program which includes automated exposure control, adjustment of the mA and/or kV according to patient size and/or use of iterative reconstruction technique. CONTRAST:  6mL OMNIPAQUE IOHEXOL 350 MG/ML SOLN COMPARISON:  07/11/2022 FINDINGS: VASCULAR Aorta: Aortic atherosclerosis. Normal caliber  aorta without aneurysm, dissection, vasculitis or significant stenosis. Celiac: Patent without evidence of aneurysm, dissection, vasculitis or significant stenosis. SMA: Patent without evidence of aneurysm, dissection, vasculitis or significant stenosis. Renals: Both renal arteries are patent without evidence of aneurysm, dissection, vasculitis, fibromuscular dysplasia or significant stenosis. IMA: Patent Inflow: Patent without evidence of aneurysm, dissection, vasculitis or significant stenosis. Proximal Outflow: Bilateral common femoral and visualized portions of the superficial and profunda femoral arteries are patent without evidence of aneurysm, dissection, vasculitis or significant stenosis. Veins: No obvious venous abnormality within the limitations of this arterial phase study. Bilateral central venous catheters are identified which inter from an inguinal approach Review of the MIP images confirms the above findings. NON-VASCULAR Lower chest: Small left pleural effusion. Subpleural consolidation is noted within both lung bases. Hepatobiliary: No focal liver abnormality. Liver has a cirrhotic appearance with a nodular contour. Partially decompressed gallbladder with small stones suspected. No bile duct dilatation. Pancreas: Unremarkable. No pancreatic ductal dilatation or surrounding inflammatory changes. Spleen: Normal in size without focal abnormality. Adrenals/Urinary Tract: Adrenal glands are unremarkable. Kidneys are normal, without renal calculi, focal lesion, or hydronephrosis. Bladder is unremarkable. Stomach/Bowel: There is an enteric tube in place with tip in the body2 of stomach. The stomach appears distended with debris as noted on the previous exam. High attenuation material is again noted within the stomach, small, and large bowel loops which is nonspecific and may represent intraluminal hematoma. This limits sensitivity for detecting underlying active GI bleeding. On the arterial phase images  there is a small focus of active extravasation of IV contrast material into the lumen of the stomach, image 83/5. Review of the previous CT from prior day shows a similar focus of contrast extravasation. Edema involving the wall of the proximal duodenum is noted, image 74/5. No pathologic dilatation of the large or small bowel loops. Diffuse bowel wall edema is noted involving the colon. No signs of pneumatosis. Lymphatic: Prominent upper abdominal lymph nodes are noted, nonspecific in the setting of cirrhosis. Gastrohepatic ligament lymph node measures up to 1.2 cm, image 24/7. Aortocaval lymph node measures 1.1  cm, image 33/7. Reproductive: Uterus appears unremarkable. Normal appearance of the right adnexa. Within the left adnexa there is a solid and cystic structure which measures 4.2 x 3.0 by 4.4 cm, image 78/10. Other: There is a moderate volume of abdominopelvic ascites. Musculoskeletal: No acute findings. Compression deformities are again seen involving T8, T9, L2 and L5. Unchanged from previous exam. Bilateral L5 pars defects identified. IMPRESSION: 1. There is a small focus of active intraluminal extravasation of IV contrast material from the posterior wall of the stomach compatible with active bleeding. Review of the previous CT from prior day shows a similar focus of contrast extravasation. High attenuation material is identified within the small and large bowel loops which may represent sequelae of gastric bleeding. 2. Diffuse bowel wall edema involving the colon is again noted compatible with colitis. 3. Cirrhosis with moderate volume of abdominopelvic ascites. 4. Small left pleural effusion with subpleural consolidation within both lung bases. 5. Solid and cystic structure within the left adnexa is identified measuring up to 4.4 cm. Recommend further evaluation with nonemergent pelvic sonogram. 6.  Aortic Atherosclerosis (ICD10-I70.0). Electronically Signed: By: Kerby Moors M.D. On: 07/12/2022 07:21    DG CHEST PORT 1 VIEW  Result Date: 07/12/2022 CLINICAL DATA:  Acute respiratory failure and hypoxia. EXAM: PORTABLE CHEST 1 VIEW COMPARISON:  07/10/2022 FINDINGS: ETT tip is stable above the carina. There is enteric tube with tip in the stomach. Stable cardiomediastinal contours. Small left pleural effusion. Atelectasis noted within both lung bases. Remote right clavicle fracture. IMPRESSION: 1. Stable support apparatus. 2. Small left pleural effusion. 3. Bibasilar atelectasis. Electronically Signed   By: Kerby Moors M.D.   On: 07/12/2022 07:26   CT Angio Abd/Pel w/ and/or w/o  Result Date: 07/11/2022 CLINICAL DATA:  Left upper quadrant pain, upper GI bleed, leukocytosis, cirrhosis EXAM: CTA ABDOMEN AND PELVIS WITHOUT AND WITH CONTRAST TECHNIQUE: Multidetector CT imaging of the abdomen and pelvis was performed using the standard protocol during bolus administration of intravenous contrast. Multiplanar reconstructed images and MIPs were obtained and reviewed to evaluate the vascular anatomy. RADIATION DOSE REDUCTION: This exam was performed according to the departmental dose-optimization program which includes automated exposure control, adjustment of the mA and/or kV according to patient size and/or use of iterative reconstruction technique. CONTRAST:  52mL OMNIPAQUE IOHEXOL 350 MG/ML SOLN COMPARISON:  01/13/2022 FINDINGS: Lower chest: Trace bilateral pleural effusions with bilateral lower lobe atelectasis. Hepatobiliary: Cirrhosis.  No focal hepatic lesion is seen. Gallbladder is decompressed with suspected cholelithiasis. No intrahepatic or extrahepatic duct dilatation. Pancreas: Mild peripancreatic fluid along the uncinate process (series 9/image 39). This is equivocal and may be related to surrounding ascites, but mild pancreatitis is possible. No walled-off necrosis. Spleen: Within normal limits. Adrenals/Urinary Tract: Adrenal glands are within normal limits. Kidneys are within normal limits.   No hydronephrosis. Bladder is within normal limits. Stomach/Bowel: Distended stomach with debris, some of which is hyperdense and could reflect a hematoma (series 2/image 25). Enteric tube terminates in the distal gastric body. No evidence of bowel obstruction. Appendix is not discretely visualized. Wall thickening involving the transverse, descending, and rectosigmoid colon, suggesting infectious/inflammatory colitis. Hyperdense luminal colonic contents on unenhanced CT, nonspecific and mildly limiting evaluation. Following contrast administration, there is no contrast extravasation into the lumen on arterial or delayed phases to suggest active GI bleeding. Vascular/Lymphatic: No evidence of abdominal aortic aneurysm. Atherosclerotic calcifications of the abdominal aorta and branch vessels. Portal vein is patent. No suspicious abdominopelvic lymphadenopathy. Reproductive: Uterus and bilateral ovaries are  within normal limits. Other: Moderate abdominopelvic ascites.  No free air. Musculoskeletal: Degenerative changes of the visualized thoracolumbar spine. Moderate central compression fracture deformities at T8-9 (sagittal image 70), chronic. Moderate superior endplate compression fracture deformity at L2 (sagittal image 81, chronic. Mild superior endplate compression fracture deformity at L5, chronic. 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 Review of the MIP images confirms the above findings. IMPRESSION: No findings suspicious for active GI bleeding. Distended stomach with suspected intraluminal hematoma. Consider upper endoscopy for further evaluation/direct inspection. Wall thickening involving the transverse, descending, and rectosigmoid colon, suggesting infectious/inflammatory colitis. Cirrhosis.  Moderate abdominopelvic ascites. Additional ancillary findings as above. Electronically Signed   By: Julian Hy M.D.   On: 07/11/2022 01:58   DG CHEST PORT 1 VIEW  Result Date: 07/10/2022 CLINICAL DATA:   Endotracheal tube and central line placement. EXAM: PORTABLE CHEST 1 VIEW COMPARISON:  07/10/2022. FINDINGS: The heart size and mediastinal contours are stable. There is atherosclerotic calcification of the aorta. Lung volumes are low. Interstitial prominence is noted bilaterally. No effusion or pneumothorax. The endotracheal tube terminates 3.4 cm above the carina. An enteric tube courses over the mid abdomen and out of the field of view. No central venous catheter is seen. No acute osseous abnormality. IMPRESSION: 1. Stable chest with no acute process. 2. Endotracheal and enteric tubes as described above. 3. Central line is not visualized on exam. Clinical correlation is recommended. Electronically Signed   By: Brett Fairy M.D.   On: 07/10/2022 23:18   DG Chest Portable 1 View  Result Date: 07/10/2022 CLINICAL DATA:  Hypoxia and shortness of breath EXAM: PORTABLE CHEST 1 VIEW COMPARISON:  Chest x-ray 07/05/2022 FINDINGS: The cardiomediastinal silhouette is within normal limits. There is no focal lung infiltrate, pleural effusion or pneumothorax. No acute fractures are seen. IMPRESSION: No active disease. Electronically Signed   By: Ronney Asters M.D.   On: 07/10/2022 18:15    IMPRESSION: Hematemesis  -Status post EGD 07/11/2022 with findings of red blood in upper third of esophagus, clotted blood in entire examined stomach, red blood in entire duodenum, mucosal visualization and possible, no obvious esophageal varices noted  -Status post EGD 07/06/2022 with findings of few linear esophageal ulcers with stigmata of recent bleeding, nonbleeding superficial gastric ulcer 15 mm dimension, 2 nonbleeding superficial gastric ulcers 8 mm dimension, few nonbleeding superficial duodenal ulcers no stigmata of bleeding  -CT angiogram 07/12/2022 with findings of small focus of active intraluminal extubation from posterior wall of stomach Hypovolemic shock  -Intubated and on multiple vasopressors in the  intensive care unit Primary biliary cirrhosis  PLAN: -Continue continue PPI therapy -Continue octreotide -Trend H/H, transfuse for hemoglobin less than 7 -Interventional radiology procedure today, pending results -Could benefit from repeat upper endoscopy, though with findings of active GI bleeding would attempt IR first, suspect repeat EGD at this time would be with low visualization given blood in stomach -Discussed treatment plan with ICU team -[name] will follow   LOS: 2 days   Danton Clap, [facility]

## 2022-07-12 NOTE — Progress Notes (Signed)
PT transported to CT and back with no complications 

## 2022-07-12 NOTE — Plan of Care (Signed)

## 2022-07-12 NOTE — Progress Notes (Signed)
eLink Physician-Brief Progress Note Patient Name: Hannah Blake DOB: Aug 15, 1950 MRN: [id number]   Date of Service  07/12/2022  HPI/Events of Note  BP soft.  eICU Interventions  Phenylephrine gtt started.        Maynor Mwangi U Adar Rase 07/12/2022, 1:15 AM

## 2022-07-12 NOTE — Plan of Care (Signed)
  Problem: Education: Goal: Knowledge of General Education information will improve Description: Including pain rating scale, medication(s)/side effects and non-pharmacologic comfort measures Outcome: Progressing   Problem: Health Behavior/Discharge Planning: Goal: Ability to manage health-related needs will improve Outcome: Progressing   Problem: Clinical Measurements: Goal: Ability to maintain clinical measurements within normal limits will improve Outcome: Progressing Goal: Will remain free from infection Outcome: Progressing Goal: Diagnostic test results will improve Outcome: Progressing Goal: Respiratory complications will improve Outcome: Progressing Goal: Cardiovascular complication will be avoided Outcome: Progressing   Problem: Activity: Goal: Risk for activity intolerance will decrease Outcome: Progressing   Problem: Nutrition: Goal: Adequate nutrition will be maintained Outcome: Progressing   Problem: Coping: Goal: Level of anxiety will decrease Outcome: Progressing   Problem: Elimination: Goal: Will not experience complications related to bowel motility Outcome: Progressing Goal: Will not experience complications related to urinary retention Outcome: Progressing   Problem: Pain Managment: Goal: General experience of comfort will improve Outcome: Progressing   Problem: Safety: Goal: Ability to remain free from injury will improve Outcome: Progressing   Problem: Skin Integrity: Goal: Risk for impaired skin integrity will decrease Outcome: Progressing   Problem: Education: Goal: Ability to identify signs and symptoms of gastrointestinal bleeding will improve Outcome: Progressing   Problem: Bowel/Gastric: Goal: Will show no signs and symptoms of gastrointestinal bleeding Outcome: Progressing   Problem: Fluid Volume: Goal: Will show no signs and symptoms of excessive bleeding Outcome: Progressing   Problem: Clinical Measurements: Goal:  Complications related to the disease process, condition or treatment will be avoided or minimized Outcome: Progressing   Problem: Education: Goal: Understanding of CV disease, CV risk reduction, and recovery process will improve Outcome: Progressing Goal: Individualized Educational Video(s) Outcome: Progressing   Problem: Activity: Goal: Ability to return to baseline activity level will improve Outcome: Progressing   Problem: Cardiovascular: Goal: Ability to achieve and maintain adequate cardiovascular perfusion will improve Outcome: Progressing Goal: Vascular access site(s) Level 0-1 will be maintained Outcome: Progressing   Problem: Health Behavior/Discharge Planning: Goal: Ability to safely manage health-related needs after discharge will improve Outcome: Progressing

## 2022-07-12 NOTE — Progress Notes (Addendum)
eLink Physician-Brief Progress Note Patient Name: Hannah Blake DOB: 08-22-1950 MRN: [id number]   Date of Service  07/12/2022  HPI/Events of Note  Dr. Dulce Sellar called back bedside RN and requested a stat CT abdomen GI bleeding protocol order be placed.  eICU Interventions  Order placed by me and CT technician Megan called, she will expedite patient coming down to CT for the study. Grand son Gladyes Kudo called and updated.        Thomasene Lot Gursimran Litaker 07/12/2022, 5:47 AM

## 2022-07-12 NOTE — Progress Notes (Addendum)
eLink Physician-Brief Progress Note Patient Name: Hannah Blake DOB: [birth year] MRN: [id number]   Date of Service  07/12/2022  HPI/Events of Note  Patient had 800 cc of BRB out of her enteral tube and hemoglobin is now 5.7 gm / dl.  eICU Interventions  Stat transfusion of 3 units of PRBC with 2 held in reserve, bedside RN instructed to call GI stat to update them regarding the fresh bleeding, will check coagulation parameters. I called the blood bank to request urgent release of the units and spoke with Asher Muir in the blood bank.        Migdalia Dk 07/12/2022, 4:39 AM

## 2022-07-12 NOTE — Sedation Documentation (Signed)
Pt is on a continuous propofol gtt. RN monitoring pt for need of additional sedation during the procedure.

## 2022-07-12 NOTE — Progress Notes (Signed)
NAME:  Hannah Blake, MRN:  [id number], DOB:  11/24/1950, LOS: 2 ADMISSION DATE:  07/10/2022, CONSULTATION DATE: 12/21 REFERRING MD: Dr. Rush Landmark, CHIEF COMPLAINT: Blood in stool   History of Present Illness:  71 y/o F who presented to [facility] ER with reports of bloody stools.   The patient was admitted from 12/16-12/20/23 for hematemesis.  She was admitted with shock in the setting of GIB.  She required 4 units PRBC.  GI consulted and EGD completed showed ulcerations in the esophagus, stomach and duodenum with clots noted throughout the stomach. No significant varices on EGD noted. GI was concerned for possible NSAID use PTA. She required vasopressors, PPI infusion.  Rec's at discharge were for PPI BID x3 months then indefinetly, ursodiol and close follow up. Per chart review, grandson went to pick up ursodiol and it was 550$, and was unable to pick up.  She returns on 12/21 with reports of getting dizzy while having a bowel movement. Family noted blood in toilet. EMS was activated.  Initial evaluation noted her to have a blood pressure of 60/30.  On arrival to ER, RA saturations 83% and was placed on a NRB. Initial labs notable for Hgb 4.9, WBC 34.2, platelets 197. Temp 95.1. Other labs pending.   PCCM called for ICU admission.   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  Intubated, EGD with blood/clot in stomach but unable to visualize area of bleeding. On pressors. 6 units PRBC, 1 pk platelets 12/22 CTA ABD/Pelvis with no findings suspicious for active GIB, distended stomach with suspected intraluminal hematoma, wall thickening of transverse, descending/rectosigmoid colon, moderate abdominopelvic ascites,  cirrhosis. 12/23: CT abdomen pelvis with posterior stomach bleed, large intraluminal hematoma, additional blood products given, more hypotensive, IR and GI discussed case, going for attempted embolization, difficult target  Interim History / Subjective:  Unstable this morning.  Large amount of blood, hematemesis and via NG tube around 6 AM.  Worsening hemodynamics.  Hemoglobin halved to in the fives from prior check in the 11's.  CTA angio was performed which showed posterior stomach bleeds.  IR and GI discussed case.  Likely with success but we will try embolization.  GI feels cannot visualize anything with the amount of hematoma in the lumen of the stomach.  Objective   Blood pressure (!) 127/51, pulse (!) 107, temperature 99.1 F (37.3 C), resp. rate 20, height 5' (1.524 m), weight 50.2 kg, SpO2 100 %. CVP:  [5 mmHg-13 mmHg] 10 mmHg  Vent Mode: PRVC FiO2 (%):  [40 %] 40 % Set Rate:  [20 bmp] 20 bmp Vt Set:  [360 mL] 360 mL PEEP:  [5 cmH20] 5 cmH20 Plateau Pressure:  [11 cmH20-12 cmH20] 12 cmH20   Intake/Output Summary (Last 24 hours) at 07/12/2022 0814 Last data filed at 07/12/2022 0800 Gross per 24 hour  Intake 3514.6 ml  Output 2450 ml  Net 1064.6 ml    Filed Weights   07/10/22 2000 07/11/22 0454 07/12/22 0408  Weight: 45.6 kg 49.8 kg 50.2 kg    Examination: General: critically ill appearing adult female lying in bed in NAD on vent HEENT: MM pink/moist, NGT with bright red bloody drainage Neuro: sedate on propofol CV: s1s2 RRR, SR  on monitor, no m/r/g PULM: non-labored at rest, lungs bilaterally clear  GI: mildly distended c, bsx4 hypoactive  Extremities: warm/dry, no edema  Skin: no rashes or lesions Access: R fem Aline, L fem TLC   Resolved Hospital Problem list      Assessment & Plan:   Postprocedural ventilator management: Intubated for EGD.  Remains intubated with significant hemodynamic instability. -- PRVC, VAP bundle, PPI as below -- Minimize sedation as  able given hypotension   Hemorrhagic shock due to acute blood loss anemia from gastrointestinal source: Given report of blood at home.  Recent GI bleed.  Recent evidence of multiple ulcers in the esophagus and stomach.  Massive transfusion protocol 07/10/2022 with improvement in hemoglobin but ongoing hypotension.  Discussed at bedside revealed a lot of blood in the stomach, no clear source.  Initial CTA angio of the abdomen pelvis was unrevealing, repeat AM 12/23 reveals difficult target for embo. -- MAP greater than 65, phenylephrine, neo and vaso, wean neo first -- s/p 10u pRBC and 1u plt documented -- PPI twice daily and IV octreotide -- Hgb stable but ongoing BRB in NG tube -- GI consulted, appreciate assistance --IR consult for embolization, appreciate assitance   PBC with cirrhosis: -- Supportive care, ceftriaxone for SBP prophylaxis given history of ascites   GERD: PPI as above   Heart failure with preserved ejection fraction: -- transfuse as needed, cautious IVF   Grave prognosis, family updated, re-address GOC based after procedure  Best Practice (right click and "Reselect all SmartList Selections" daily)  Diet/type: NPO DVT prophylaxis: SCD GI prophylaxis: PPI Lines: Central line and Arterial Line Foley:  Yes, and it is still needed Code Status:  full code Last date of multidisciplinary goals of care discussion: 12/21 - confirmed with grandsons, who indicate they are HCPOA, that she is full code.  Would be accepting of all measures in the event of decline.  See iPAL noted from 12/22 as well, confirms above.   Family updated 12/23   Critical care time:     CRITICAL CARE Performed by: Lanier Clam   Total critical care time: 80 minutes  Critical care time was exclusive of separately billable procedures and treating other patients.  Critical care was necessary to treat or prevent imminent or life-threatening deterioration.  Critical care was time spent  personally by me on the following activities: development of treatment plan with patient and/or surrogate as well as nursing, discussions with consultants, evaluation of patient's response to treatment, examination of patient, obtaining history from patient or surrogate, ordering and performing treatments and interventions, ordering and review of laboratory studies, ordering and review of radiographic studies, pulse oximetry and re-evaluation of patient's condition.   Lanier Clam, MD [facility] 07/12/2022, 8:14 AM  Please see [email or web address] for pager details.  From 7A-7P if no response, please call [phone] After hours, please call ELink [phone]

## 2022-07-13 LAB — BASIC METABOLIC PANEL
Anion gap: 7 (ref 5–15)
BUN: 33 mg/dL — ABNORMAL HIGH (ref 8–23)
CO2: 19 mmol/L — ABNORMAL LOW (ref 22–32)
Calcium: 7.5 mg/dL — ABNORMAL LOW (ref 8.9–10.3)
Chloride: 113 mmol/L — ABNORMAL HIGH (ref 98–111)
Creatinine, Ser: 0.97 mg/dL (ref 0.44–1.00)
GFR, Estimated: >60 mL/min (ref >60)
Glucose, Bld: 138 mg/dL — ABNORMAL HIGH (ref 70–99)
Potassium: 3.5 mmol/L (ref 3.5–5.1)
Sodium: 139 mmol/L (ref 135–145)

## 2022-07-13 LAB — GLUCOSE, CAPILLARY
Glucose-Capillary: 106 mg/dL — ABNORMAL HIGH (ref 70–99)
Glucose-Capillary: 108 mg/dL — ABNORMAL HIGH (ref 70–99)
Glucose-Capillary: 112 mg/dL — ABNORMAL HIGH (ref 70–99)
Glucose-Capillary: 118 mg/dL — ABNORMAL HIGH (ref 70–99)
Glucose-Capillary: 125 mg/dL — ABNORMAL HIGH (ref 70–99)
Glucose-Capillary: 130 mg/dL — ABNORMAL HIGH (ref 70–99)
Glucose-Capillary: 131 mg/dL — ABNORMAL HIGH (ref 70–99)

## 2022-07-13 LAB — CBC
HCT: 29.7 % — ABNORMAL LOW (ref 36.0–46.0)
Hemoglobin: 9.9 g/dL — ABNORMAL LOW (ref 12.0–15.0)
MCH: 30.7 pg (ref 26.0–34.0)
MCHC: 33.3 g/dL (ref 30.0–36.0)
MCV: 92.2 fL (ref 80.0–100.0)
Platelets: 144 10*3/uL — ABNORMAL LOW (ref 150–400)
RBC: 3.22 MIL/uL — ABNORMAL LOW (ref 3.87–5.11)
RDW: 19.8 % — ABNORMAL HIGH (ref 11.5–15.5)
WBC: 30.7 10*3/uL — ABNORMAL HIGH (ref 4.0–10.5)
nRBC: 0.9 % — ABNORMAL HIGH (ref 0.0–0.2)

## 2022-07-13 LAB — PHOSPHORUS: Phosphorus: 3.5 mg/dL (ref 2.5–4.6)

## 2022-07-13 LAB — MAGNESIUM: Magnesium: 2 mg/dL (ref 1.7–2.4)

## 2022-07-13 MED ORDER — LACTATED RINGERS IV BOLUS
500.0000 mL | Freq: Once | INTRAVENOUS | Status: AC
  Administered 2022-07-13: 500 mL via INTRAVENOUS

## 2022-07-13 NOTE — Progress Notes (Signed)
[facility] Progress Note  SUBJECTIVE:   Interval history: Hannah Blake was seen and evaluated today at bedside.  Underwent IR angiogram with successful embolization of branch of left gastric artery on 07/12/2022.  Remains intubated and sedated with propofol.  Unable to obtain review of systems.  No family at bedside this a.m.  Per discussion with nursing, no bowel movements, estimated 1 L of blood per OG tube in last 24 hours though this a.m. has had slowed OG tube output, no contents in canister, only in tubing.  Has been weaned from 1 vasopressor, remains on levo and vaso.  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[facility])    Past Surgical History:  Procedure Laterality Date   BIOPSY  01/14/2022   Procedure: BIOPSY;  Surgeon: Kathi Der, MD;  Location: [facility];  Service: Gastroenterology;;   BIOPSY  07/06/2022   Procedure: BIOPSY;  Surgeon: Kathi Der, MD;  Location: [facility];  Service: Gastroenterology;;   CLAVICLE SURGERY     ESOPHAGEAL BANDING  01/14/2022   Procedure: ESOPHAGEAL BANDING;  Surgeon: Kathi Der, MD;  Location: [facility];  Service: Gastroenterology;;   ESOPHAGOGASTRODUODENOSCOPY N/A 01/14/2022   Procedure: ESOPHAGOGASTRODUODENOSCOPY (EGD);  Surgeon: Kathi Der, MD;  Location: [facility];  Service: Gastroenterology;  Laterality: N/A;   ESOPHAGOGASTRODUODENOSCOPY (EGD) WITH PROPOFOL N/A 07/06/2022   Procedure: ESOPHAGOGASTRODUODENOSCOPY (EGD) WITH PROPOFOL;  Surgeon: Kathi Der, MD;  Location: [facility];  Service: Gastroenterology;  Laterality: N/A;   FRACTURE SURGERY     IR ANGIOGRAM SELECTIVE EACH ADDITIONAL VESSEL  07/12/2022   IR ANGIOGRAM VISCERAL SELECTIVE  07/12/2022   IR ANGIOGRAM VISCERAL SELECTIVE  07/12/2022   IR TRANSCATH/EMBOLIZ  07/12/2022   IR US GUIDE VASC  ACCESS LEFT  07/12/2022   Current Facility-Administered Medications  Medication Dose Route Frequency Provider Last Rate Last Admin   0.9 %  sodium chloride infusion (Manually program via Guardrails IV Fluids)   Intravenous Once Yap, Vanessa, MD       0.9 %  sodium chloride infusion (Manually program via Guardrails IV Fluids)   Intravenous Once [name], MD       0.9 %  sodium chloride infusion  250 mL Intravenous Continuous Hunsucker, Lesia Sago, MD 20 mL/hr at 07/12/22 0521 250 mL at 07/12/22 0521   cefTRIAXone (ROCEPHIN) 2 g in sodium chloride 0.9 % 100 mL IVPB  2 g Intravenous q1800 Hunsucker, Lesia Sago, MD   Stopped at 07/12/22 1811   Chlorhexidine Gluconate Cloth 2 % PADS 6 each  6 each Topical Daily Hunsucker, Lesia Sago, MD   6 each at 07/12/22 2220   docusate (COLACE) 50 MG/5ML liquid 100 mg  100 mg Per Tube BID Omar Person, MD       docusate sodium (COLACE) capsule 100 mg  100 mg Oral BID PRN Hunsucker, Lesia Sago, MD       fentaNYL (SUBLIMAZE) injection 25-100 mcg  25-100 mcg Intravenous Q30 min PRN Omar Person, MD   100 mcg at 07/13/22 0947   midazolam (VERSED) injection 2 mg  2 mg Intravenous Q2H PRN Hunsucker, Lesia Sago, MD   2 mg at 07/12/22 0843   norepinephrine (LEVOPHED) 16 mg in (0.064 mg/mL) premix infusion  0-40 mcg/min Intravenous Titrated Hunsucker, Lesia Sago, MD 12.19 mL/hr at 07/13/22 1200 13 mcg/min at 07/13/22 1200  octreotide (SANDOSTATIN) 500 mcg in sodium chloride 0.9 % 250 mL (2 mcg/mL) infusion  50 mcg/hr Intravenous Continuous Hunsucker, Lesia Sago, MD 25 mL/hr at 07/13/22 1200 50 mcg/hr at 07/13/22 1200   Oral care mouth rinse  15 mL Mouth Rinse Q2H Hunsucker, Lesia Sago, MD   15 mL at 07/13/22 0949   Oral care mouth rinse  15 mL Mouth Rinse PRN Hunsucker, Lesia Sago, MD       pantoprazole (PROTONIX) injection 40 mg  40 mg Intravenous Q12H Hunsucker, Lesia Sago, MD   40 mg at 07/13/22 3846   phenylephrine CONCENTRATED 100mg  in sodium chloride 0.9%  (0.4mg /mL) premix infusion  0-400 mcg/min Intravenous Titrated L, NP   Stopped at 07/12/22 1742   polyethylene glycol (MIRALAX / GLYCOLAX) packet 17 g  17 g Oral Daily PRN Hunsucker, 07/14/22, MD       polyethylene glycol (MIRALAX / GLYCOLAX) packet 17 g  17 g Per Tube Daily Lesia Sago, MD       propofol (DIPRIVAN) 1000 MG/100ML infusion  0-50 mcg/kg/min Intravenous Continuous Omar Person, MD 10.94 mL/hr at 07/13/22 1200 40 mcg/kg/min at 07/13/22 1200   vasopressin (PITRESSIN) 20 Units in sodium chloride 0.9 % 100 mL infusion-*FOR SHOCK*  0.03 Units/min Intravenous Continuous 07/15/22, MD 9 mL/hr at 07/13/22 1200 0.03 Units/min at 07/13/22 1200   Allergies as of 07/10/2022   (No Known Allergies)   Review of Systems:  Review of Systems  Reason unable to perform ROS: Intubated, sedated.    OBJECTIVE:   Temp:  [96.8 F (36 C)-100.6 F (38.1 C)] 98.4 F (36.9 C) (12/24 1200) Pulse Rate:  [85-117] 85 (12/24 1200) Resp:  [12-22] 20 (12/24 1200) BP: (96-143)/(42-60) 114/50 (12/24 1200) SpO2:  [100 %] 100 % (12/24 1200) Arterial Line BP: (105-252)/(36-58) 129/49 (12/24 1200) FiO2 (%):  [40 %] 40 % (12/24 0800) Weight:  [49.6 kg] 49.6 kg (12/24 0338) Last BM Date : 07/11/22 Physical Exam Constitutional:      General: She is not in acute distress.    Appearance: She is not toxic-appearing.     Interventions: She is sedated and intubated.  Cardiovascular:     Rate and Rhythm: Normal rate and regular rhythm.  Pulmonary:     Effort: No respiratory distress. She is intubated.     Comments: Intubated Abdominal:     General: Bowel sounds are normal. There is no distension.     Palpations: Abdomen is soft.     Tenderness: There is no abdominal tenderness. There is no guarding.     Labs: Recent Labs    07/12/22 0249 07/12/22 0838 07/12/22 1300 07/13/22 0511  WBC 24.9*  --  24.7* 30.7*  HGB 5.7*  --  10.2* 9.9*  HCT 17.2*  --  30.2* 29.7*   PLT 136* 127* 109* 144*   BMET Recent Labs    07/11/22 0455 07/12/22 0127 07/13/22 0511  NA 137 136 139  K 4.0 3.9 3.5  CL 111 110 113*  CO2 20* 21* 19*  GLUCOSE 133* 152* 138*  BUN 18 29* 33*  CREATININE 0.94 1.26* 0.97  CALCIUM 6.7* 6.7* 7.5*   LFT Recent Labs    07/12/22 0127  PROT 4.6*  ALBUMIN 2.7*  AST 262*  ALT 113*  ALKPHOS 118  BILITOT 1.8*   PT/INR Recent Labs    07/11/22 1123 07/12/22 0838  LABPROT 17.1* 17.3*  INR 1.4* 1.4*   Diagnostic imaging: IR Angiogram Visceral  Selective  Result Date: 07/12/2022 INDICATION: Upper GI bleeding, large volume hematemesis, hemorrhagic shock EXAM: 1. Ultrasound-guided puncture of the left common femoral artery 2. Catheterization and angiography of the celiac artery 3. Selective catheterization angiography of the left gastric artery 4. Selective catheterization angiography of a branch of the left gastric artery 5. Embolization of the left gastric artery MEDICATIONS: Documented in the EMR ANESTHESIA/SEDATION: Moderate (conscious) sedation was employed during this procedure. A total of Versed 2 mg and Fentanyl 100 mcg was administered intravenously by the radiology nurse. Total intra-service moderate Sedation Time: 66 minutes. The patient's level of consciousness and vital signs were monitored continuously by radiology nursing throughout the procedure under my direct supervision. CONTRAST:  145 mL Omnipaque 300 administered intra-arterially FLUOROSCOPY: Radiation Exposure Index (as provided by the fluoroscopic device): 66 minutes (862 mGy) COMPLICATIONS: None immediate. PROCEDURE: Informed consent was obtained from the patient following explanation of the procedure, risks, benefits and alternatives. The patient understands, agrees and consents for the procedure. All questions were addressed. A time out was performed prior to the initiation of the procedure. Maximal barrier sterile technique utilized including caps, mask, sterile  gowns, sterile gloves, large sterile drape, hand hygiene, and Betadine prep. The patient was placed supine on the exam table. The left groin was prepped and draped in the standard sterile fashion. Ultrasound was used to evaluate the left common femoral artery, which found to be patent and suitable for access. An ultrasound image was permanently stored in the electronic medical record. Using ultrasound guidance, the left common femoral artery was directly punctured using a 21 gauge micropuncture set. An 018 wire was advanced centrally, followed by serial tract dilation and placement of a 5 French sheath. Using a combination of a C2 catheter and Glidewire, the celiac artery was catheterized. Angiography of the celiac artery was performed in multiple obliquities to study the anatomy, demonstrating expected branching anatomy with particular attention to the left gastric and splenic arteries based on expected location of hemorrhage on CTA imaging. No abnormal vessel branch was identified from the proximal splenic artery in the expected area of the short gastric arteries. There was suspicious appearance of a vessel cutoff/extravasation adjacent to an inferior branch of the left gastric artery. The decision was then made to proceed with selective catheterization of the left gastric artery. Using combination of a 2.8 [place] Progreat Omega microcatheter and fathom 16 microwire, the left gastric artery was selectively catheterized. Additional angiography of the left gastric artery demonstrated similar findings as seen before, with concern for vessel cutoff and active extravasation along a medial branch of the left gastric artery. Microcatheter and wire were then advanced further and the medial branch of the left gastric artery selectively catheterized. Additional angiography of this branch of the left gastric artery confirmed the presence of active gastrointestinal hemorrhage along the medial border of the stomach. This was  consistent with findings on recent CTA. The decision was then made to proceed with embolization of this branch of the left gastric artery. Using a combination of 3 mm and packing Ruby coils, this branch of the left gastric artery and the more proximal left gastric artery were successfully embolized. Completion angiography of both the proximal left gastric artery and celiac artery demonstrated no further findings of active bleeding. At the end of the procedure, all catheters and wires were removed. Hemostasis was achieved at the access site using Angio-Seal vascular closure device after limited hand injection demonstrated appropriate level of access and vessel caliber. Distal pulses were  similar. A clean dressing was placed. The patient tolerated the procedure well without immediate complication. IMPRESSION: Mesenteric angiogram of the upper GI vessels demonstrated suspected bleed originating from a distal branch of the left gastric artery, consistent with location of bleed identified on CTA. The left gastric artery was successfully embolized using coils, with no further angiographic findings of continued bleeding at the end of the procedure. Electronically Signed   By: Olive Bass M.D.   On: 07/12/2022 16:46   IR US Guide Vasc Access Left  Result Date: 07/12/2022 INDICATION: Upper GI bleeding, large volume hematemesis, hemorrhagic shock EXAM: 1. Ultrasound-guided puncture of the left common femoral artery 2. Catheterization and angiography of the celiac artery 3. Selective catheterization angiography of the left gastric artery 4. Selective catheterization angiography of a branch of the left gastric artery 5. Embolization of the left gastric artery MEDICATIONS: Documented in the EMR ANESTHESIA/SEDATION: Moderate (conscious) sedation was employed during this procedure. A total of Versed 2 mg and Fentanyl 100 mcg was administered intravenously by the radiology nurse. Total intra-service moderate Sedation Time:  66 minutes. The patient's level of consciousness and vital signs were monitored continuously by radiology nursing throughout the procedure under my direct supervision. CONTRAST:  145 mL Omnipaque 300 administered intra-arterially FLUOROSCOPY: Radiation Exposure Index (as provided by the fluoroscopic device): 66 minutes (862 mGy) COMPLICATIONS: None immediate. PROCEDURE: Informed consent was obtained from the patient following explanation of the procedure, risks, benefits and alternatives. The patient understands, agrees and consents for the procedure. All questions were addressed. A time out was performed prior to the initiation of the procedure. Maximal barrier sterile technique utilized including caps, mask, sterile gowns, sterile gloves, large sterile drape, hand hygiene, and Betadine prep. The patient was placed supine on the exam table. The left groin was prepped and draped in the standard sterile fashion. Ultrasound was used to evaluate the left common femoral artery, which found to be patent and suitable for access. An ultrasound image was permanently stored in the electronic medical record. Using ultrasound guidance, the left common femoral artery was directly punctured using a 21 gauge micropuncture set. An 018 wire was advanced centrally, followed by serial tract dilation and placement of a 5 French sheath. Using a combination of a C2 catheter and Glidewire, the celiac artery was catheterized. Angiography of the celiac artery was performed in multiple obliquities to study the anatomy, demonstrating expected branching anatomy with particular attention to the left gastric and splenic arteries based on expected location of hemorrhage on CTA imaging. No abnormal vessel branch was identified from the proximal splenic artery in the expected area of the short gastric arteries. There was suspicious appearance of a vessel cutoff/extravasation adjacent to an inferior branch of the left gastric artery. The decision  was then made to proceed with selective catheterization of the left gastric artery. Using combination of a 2.8 [place] Progreat Omega microcatheter and fathom 16 microwire, the left gastric artery was selectively catheterized. Additional angiography of the left gastric artery demonstrated similar findings as seen before, with concern for vessel cutoff and active extravasation along a medial branch of the left gastric artery. Microcatheter and wire were then advanced further and the medial branch of the left gastric artery selectively catheterized. Additional angiography of this branch of the left gastric artery confirmed the presence of active gastrointestinal hemorrhage along the medial border of the stomach. This was consistent with findings on recent CTA. The decision was then made to proceed with embolization of this branch of the  left gastric artery. Using a combination of 3 mm and packing Ruby coils, this branch of the left gastric artery and the more proximal left gastric artery were successfully embolized. Completion angiography of both the proximal left gastric artery and celiac artery demonstrated no further findings of active bleeding. At the end of the procedure, all catheters and wires were removed. Hemostasis was achieved at the access site using Angio-Seal vascular closure device after limited hand injection demonstrated appropriate level of access and vessel caliber. Distal pulses were similar. A clean dressing was placed. The patient tolerated the procedure well without immediate complication. IMPRESSION: Mesenteric angiogram of the upper GI vessels demonstrated suspected bleed originating from a distal branch of the left gastric artery, consistent with location of bleed identified on CTA. The left gastric artery was successfully embolized using coils, with no further angiographic findings of continued bleeding at the end of the procedure. Electronically Signed   By: Olive Bass M.D.   On:  07/12/2022 16:46   IR Transcath/Emboliz  Result Date: 07/12/2022 INDICATION: Upper GI bleeding, large volume hematemesis, hemorrhagic shock EXAM: 1. Ultrasound-guided puncture of the left common femoral artery 2. Catheterization and angiography of the celiac artery 3. Selective catheterization angiography of the left gastric artery 4. Selective catheterization angiography of a branch of the left gastric artery 5. Embolization of the left gastric artery MEDICATIONS: Documented in the EMR ANESTHESIA/SEDATION: Moderate (conscious) sedation was employed during this procedure. A total of Versed 2 mg and Fentanyl 100 mcg was administered intravenously by the radiology nurse. Total intra-service moderate Sedation Time: 66 minutes. The patient's level of consciousness and vital signs were monitored continuously by radiology nursing throughout the procedure under my direct supervision. CONTRAST:  145 mL Omnipaque 300 administered intra-arterially FLUOROSCOPY: Radiation Exposure Index (as provided by the fluoroscopic device): 66 minutes (862 mGy) COMPLICATIONS: None immediate. PROCEDURE: Informed consent was obtained from the patient following explanation of the procedure, risks, benefits and alternatives. The patient understands, agrees and consents for the procedure. All questions were addressed. A time out was performed prior to the initiation of the procedure. Maximal barrier sterile technique utilized including caps, mask, sterile gowns, sterile gloves, large sterile drape, hand hygiene, and Betadine prep. The patient was placed supine on the exam table. The left groin was prepped and draped in the standard sterile fashion. Ultrasound was used to evaluate the left common femoral artery, which found to be patent and suitable for access. An ultrasound image was permanently stored in the electronic medical record. Using ultrasound guidance, the left common femoral artery was directly punctured using a 21 gauge  micropuncture set. An 018 wire was advanced centrally, followed by serial tract dilation and placement of a 5 French sheath. Using a combination of a C2 catheter and Glidewire, the celiac artery was catheterized. Angiography of the celiac artery was performed in multiple obliquities to study the anatomy, demonstrating expected branching anatomy with particular attention to the left gastric and splenic arteries based on expected location of hemorrhage on CTA imaging. No abnormal vessel branch was identified from the proximal splenic artery in the expected area of the short gastric arteries. There was suspicious appearance of a vessel cutoff/extravasation adjacent to an inferior branch of the left gastric artery. The decision was then made to proceed with selective catheterization of the left gastric artery. Using combination of a 2.8 [place] Progreat Omega microcatheter and fathom 16 microwire, the left gastric artery was selectively catheterized. Additional angiography of the left gastric artery demonstrated similar findings  as seen before, with concern for vessel cutoff and active extravasation along a medial branch of the left gastric artery. Microcatheter and wire were then advanced further and the medial branch of the left gastric artery selectively catheterized. Additional angiography of this branch of the left gastric artery confirmed the presence of active gastrointestinal hemorrhage along the medial border of the stomach. This was consistent with findings on recent CTA. The decision was then made to proceed with embolization of this branch of the left gastric artery. Using a combination of 3 mm and packing Ruby coils, this branch of the left gastric artery and the more proximal left gastric artery were successfully embolized. Completion angiography of both the proximal left gastric artery and celiac artery demonstrated no further findings of active bleeding. At the end of the procedure, all catheters and  wires were removed. Hemostasis was achieved at the access site using Angio-Seal vascular closure device after limited hand injection demonstrated appropriate level of access and vessel caliber. Distal pulses were similar. A clean dressing was placed. The patient tolerated the procedure well without immediate complication. IMPRESSION: Mesenteric angiogram of the upper GI vessels demonstrated suspected bleed originating from a distal branch of the left gastric artery, consistent with location of bleed identified on CTA. The left gastric artery was successfully embolized using coils, with no further angiographic findings of continued bleeding at the end of the procedure. Electronically Signed   By: Olive Bass M.D.   On: 07/12/2022 16:46   IR Angiogram Visceral Selective  Result Date: 07/12/2022 INDICATION: Upper GI bleeding, large volume hematemesis, hemorrhagic shock EXAM: 1. Ultrasound-guided puncture of the left common femoral artery 2. Catheterization and angiography of the celiac artery 3. Selective catheterization angiography of the left gastric artery 4. Selective catheterization angiography of a branch of the left gastric artery 5. Embolization of the left gastric artery MEDICATIONS: Documented in the EMR ANESTHESIA/SEDATION: Moderate (conscious) sedation was employed during this procedure. A total of Versed 2 mg and Fentanyl 100 mcg was administered intravenously by the radiology nurse. Total intra-service moderate Sedation Time: 66 minutes. The patient's level of consciousness and vital signs were monitored continuously by radiology nursing throughout the procedure under my direct supervision. CONTRAST:  145 mL Omnipaque 300 administered intra-arterially FLUOROSCOPY: Radiation Exposure Index (as provided by the fluoroscopic device): 66 minutes (862 mGy) COMPLICATIONS: None immediate. PROCEDURE: Informed consent was obtained from the patient following explanation of the procedure, risks, benefits and  alternatives. The patient understands, agrees and consents for the procedure. All questions were addressed. A time out was performed prior to the initiation of the procedure. Maximal barrier sterile technique utilized including caps, mask, sterile gowns, sterile gloves, large sterile drape, hand hygiene, and Betadine prep. The patient was placed supine on the exam table. The left groin was prepped and draped in the standard sterile fashion. Ultrasound was used to evaluate the left common femoral artery, which found to be patent and suitable for access. An ultrasound image was permanently stored in the electronic medical record. Using ultrasound guidance, the left common femoral artery was directly punctured using a 21 gauge micropuncture set. An 018 wire was advanced centrally, followed by serial tract dilation and placement of a 5 French sheath. Using a combination of a C2 catheter and Glidewire, the celiac artery was catheterized. Angiography of the celiac artery was performed in multiple obliquities to study the anatomy, demonstrating expected branching anatomy with particular attention to the left gastric and splenic arteries based on expected location of hemorrhage  on CTA imaging. No abnormal vessel branch was identified from the proximal splenic artery in the expected area of the short gastric arteries. There was suspicious appearance of a vessel cutoff/extravasation adjacent to an inferior branch of the left gastric artery. The decision was then made to proceed with selective catheterization of the left gastric artery. Using combination of a 2.8 [place] Progreat Omega microcatheter and fathom 16 microwire, the left gastric artery was selectively catheterized. Additional angiography of the left gastric artery demonstrated similar findings as seen before, with concern for vessel cutoff and active extravasation along a medial branch of the left gastric artery. Microcatheter and wire were then advanced further and  the medial branch of the left gastric artery selectively catheterized. Additional angiography of this branch of the left gastric artery confirmed the presence of active gastrointestinal hemorrhage along the medial border of the stomach. This was consistent with findings on recent CTA. The decision was then made to proceed with embolization of this branch of the left gastric artery. Using a combination of 3 mm and packing Ruby coils, this branch of the left gastric artery and the more proximal left gastric artery were successfully embolized. Completion angiography of both the proximal left gastric artery and celiac artery demonstrated no further findings of active bleeding. At the end of the procedure, all catheters and wires were removed. Hemostasis was achieved at the access site using Angio-Seal vascular closure device after limited hand injection demonstrated appropriate level of access and vessel caliber. Distal pulses were similar. A clean dressing was placed. The patient tolerated the procedure well without immediate complication. IMPRESSION: Mesenteric angiogram of the upper GI vessels demonstrated suspected bleed originating from a distal branch of the left gastric artery, consistent with location of bleed identified on CTA. The left gastric artery was successfully embolized using coils, with no further angiographic findings of continued bleeding at the end of the procedure. Electronically Signed   By: Olive Bass M.D.   On: 07/12/2022 16:46   IR Angiogram Selective Each Additional Vessel  Result Date: 07/12/2022 INDICATION: Upper GI bleeding, large volume hematemesis, hemorrhagic shock EXAM: 1. Ultrasound-guided puncture of the left common femoral artery 2. Catheterization and angiography of the celiac artery 3. Selective catheterization angiography of the left gastric artery 4. Selective catheterization angiography of a branch of the left gastric artery 5. Embolization of the left gastric artery  MEDICATIONS: Documented in the EMR ANESTHESIA/SEDATION: Moderate (conscious) sedation was employed during this procedure. A total of Versed 2 mg and Fentanyl 100 mcg was administered intravenously by the radiology nurse. Total intra-service moderate Sedation Time: 66 minutes. The patient's level of consciousness and vital signs were monitored continuously by radiology nursing throughout the procedure under my direct supervision. CONTRAST:  145 mL Omnipaque 300 administered intra-arterially FLUOROSCOPY: Radiation Exposure Index (as provided by the fluoroscopic device): 66 minutes (862 mGy) COMPLICATIONS: None immediate. PROCEDURE: Informed consent was obtained from the patient following explanation of the procedure, risks, benefits and alternatives. The patient understands, agrees and consents for the procedure. All questions were addressed. A time out was performed prior to the initiation of the procedure. Maximal barrier sterile technique utilized including caps, mask, sterile gowns, sterile gloves, large sterile drape, hand hygiene, and Betadine prep. The patient was placed supine on the exam table. The left groin was prepped and draped in the standard sterile fashion. Ultrasound was used to evaluate the left common femoral artery, which found to be patent and suitable for access. An ultrasound image was permanently stored  in the electronic medical record. Using ultrasound guidance, the left common femoral artery was directly punctured using a 21 gauge micropuncture set. An 018 wire was advanced centrally, followed by serial tract dilation and placement of a 5 French sheath. Using a combination of a C2 catheter and Glidewire, the celiac artery was catheterized. Angiography of the celiac artery was performed in multiple obliquities to study the anatomy, demonstrating expected branching anatomy with particular attention to the left gastric and splenic arteries based on expected location of hemorrhage on CTA imaging.  No abnormal vessel branch was identified from the proximal splenic artery in the expected area of the short gastric arteries. There was suspicious appearance of a vessel cutoff/extravasation adjacent to an inferior branch of the left gastric artery. The decision was then made to proceed with selective catheterization of the left gastric artery. Using combination of a 2.8 [place] Progreat Omega microcatheter and fathom 16 microwire, the left gastric artery was selectively catheterized. Additional angiography of the left gastric artery demonstrated similar findings as seen before, with concern for vessel cutoff and active extravasation along a medial branch of the left gastric artery. Microcatheter and wire were then advanced further and the medial branch of the left gastric artery selectively catheterized. Additional angiography of this branch of the left gastric artery confirmed the presence of active gastrointestinal hemorrhage along the medial border of the stomach. This was consistent with findings on recent CTA. The decision was then made to proceed with embolization of this branch of the left gastric artery. Using a combination of 3 mm and packing Ruby coils, this branch of the left gastric artery and the more proximal left gastric artery were successfully embolized. Completion angiography of both the proximal left gastric artery and celiac artery demonstrated no further findings of active bleeding. At the end of the procedure, all catheters and wires were removed. Hemostasis was achieved at the access site using Angio-Seal vascular closure device after limited hand injection demonstrated appropriate level of access and vessel caliber. Distal pulses were similar. A clean dressing was placed. The patient tolerated the procedure well without immediate complication. IMPRESSION: Mesenteric angiogram of the upper GI vessels demonstrated suspected bleed originating from a distal branch of the left gastric artery,  consistent with location of bleed identified on CTA. The left gastric artery was successfully embolized using coils, with no further angiographic findings of continued bleeding at the end of the procedure. Electronically Signed   By: Olive Bass M.D.   On: 07/12/2022 16:46   CT ANGIO GI BLEED  Addendum Date: 07/12/2022   ADDENDUM REPORT: 07/12/2022 07:27 ADDENDUM: Critical Value/emergent results were called by telephone at the time of interpretation on 07/12/2022 at 7:26 am to provider Dr. Judeth Horn, who verbally acknowledged these results. Electronically Signed   By: Signa Kell M.D.   On: 07/12/2022 07:27   Result Date: 07/12/2022 CLINICAL DATA:  Evaluate for GI bleed. EXAM: CTA ABDOMEN AND PELVIS WITHOUT AND WITH CONTRAST TECHNIQUE: Multidetector CT imaging of the abdomen and pelvis was performed using the standard protocol during bolus administration of intravenous contrast. Multiplanar reconstructed images and MIPs were obtained and reviewed to evaluate the vascular anatomy. RADIATION DOSE REDUCTION: This exam was performed according to the departmental dose-optimization program which includes automated exposure control, adjustment of the mA and/or kV according to patient size and/or use of iterative reconstruction technique. CONTRAST:  80mL OMNIPAQUE IOHEXOL 350 MG/ML SOLN COMPARISON:  07/11/2022 FINDINGS: VASCULAR Aorta: Aortic atherosclerosis. Normal caliber aorta without aneurysm, dissection, vasculitis or  significant stenosis. Celiac: Patent without evidence of aneurysm, dissection, vasculitis or significant stenosis. SMA: Patent without evidence of aneurysm, dissection, vasculitis or significant stenosis. Renals: Both renal arteries are patent without evidence of aneurysm, dissection, vasculitis, fibromuscular dysplasia or significant stenosis. IMA: Patent Inflow: Patent without evidence of aneurysm, dissection, vasculitis or significant stenosis. Proximal Outflow: Bilateral common femoral  and visualized portions of the superficial and profunda femoral arteries are patent without evidence of aneurysm, dissection, vasculitis or significant stenosis. Veins: No obvious venous abnormality within the limitations of this arterial phase study. Bilateral central venous catheters are identified which inter from an inguinal approach Review of the MIP images confirms the above findings. NON-VASCULAR Lower chest: Small left pleural effusion. Subpleural consolidation is noted within both lung bases. Hepatobiliary: No focal liver abnormality. Liver has a cirrhotic appearance with a nodular contour. Partially decompressed gallbladder with small stones suspected. No bile duct dilatation. Pancreas: Unremarkable. No pancreatic ductal dilatation or surrounding inflammatory changes. Spleen: Normal in size without focal abnormality. Adrenals/Urinary Tract: Adrenal glands are unremarkable. Kidneys are normal, without renal calculi, focal lesion, or hydronephrosis. Bladder is unremarkable. Stomach/Bowel: There is an enteric tube in place with tip in the body2 of stomach. The stomach appears distended with debris as noted on the previous exam. High attenuation material is again noted within the stomach, small, and large bowel loops which is nonspecific and may represent intraluminal hematoma. This limits sensitivity for detecting underlying active GI bleeding. On the arterial phase images there is a small focus of active extravasation of IV contrast material into the lumen of the stomach, image 83/5. Review of the previous CT from prior day shows a similar focus of contrast extravasation. Edema involving the wall of the proximal duodenum is noted, image 74/5. No pathologic dilatation of the large or small bowel loops. Diffuse bowel wall edema is noted involving the colon. No signs of pneumatosis. Lymphatic: Prominent upper abdominal lymph nodes are noted, nonspecific in the setting of cirrhosis. Gastrohepatic ligament lymph  node measures up to 1.2 cm, image 24/7. Aortocaval lymph node measures 1.1 cm, image 33/7. Reproductive: Uterus appears unremarkable. Normal appearance of the right adnexa. Within the left adnexa there is a solid and cystic structure which measures 4.2 x 3.0 by 4.4 cm, image 78/10. Other: There is a moderate volume of abdominopelvic ascites. Musculoskeletal: No acute findings. Compression deformities are again seen involving T8, T9, L2 and L5. Unchanged from previous exam. Bilateral L5 pars defects identified. IMPRESSION: 1. There is a small focus of active intraluminal extravasation of IV contrast material from the posterior wall of the stomach compatible with active bleeding. Review of the previous CT from prior day shows a similar focus of contrast extravasation. High attenuation material is identified within the small and large bowel loops which may represent sequelae of gastric bleeding. 2. Diffuse bowel wall edema involving the colon is again noted compatible with colitis. 3. Cirrhosis with moderate volume of abdominopelvic ascites. 4. Small left pleural effusion with subpleural consolidation within both lung bases. 5. Solid and cystic structure within the left adnexa is identified measuring up to 4.4 cm. Recommend further evaluation with nonemergent pelvic sonogram. 6.  Aortic Atherosclerosis (ICD10-I70.0). Electronically Signed: By: Signa Kell M.D. On: 07/12/2022 07:21   DG CHEST PORT 1 VIEW  Result Date: 07/12/2022 CLINICAL DATA:  Acute respiratory failure and hypoxia. EXAM: PORTABLE CHEST 1 VIEW COMPARISON:  07/10/2022 FINDINGS: ETT tip is stable above the carina. There is enteric tube with tip in the stomach. Stable cardiomediastinal  contours. Small left pleural effusion. Atelectasis noted within both lung bases. Remote right clavicle fracture. IMPRESSION: 1. Stable support apparatus. 2. Small left pleural effusion. 3. Bibasilar atelectasis. Electronically Signed   By: Signa Kellaylor  Stroud M.D.   On:  07/12/2022 07:26    IMPRESSION: Hematemesis             -Status post EGD 07/11/2022 with findings of red blood in upper third of esophagus, clotted blood in entire examined stomach, red blood in entire duodenum, mucosal visualization limited, no obvious esophageal varices noted             -Status post EGD 07/06/2022 with findings of few linear esophageal ulcers with stigmata of recent bleeding, nonbleeding superficial gastric ulcer 15 mm dimension, 2 nonbleeding superficial gastric ulcers 8 mm dimension, few nonbleeding superficial duodenal ulcers no stigmata of bleeding             -CT angiogram 07/12/2022 with findings of small focus of active intraluminal extravasation from posterior wall of stomach  -S/p interventional radiology angiogram 07/12/2022 with embolization of bleeding vessel (branch of left gastric artery) Normocytic, acute blood loss anemia, improved Hypovolemic shock, improved             -Intubated and on multiple vasopressors (down to 2 from 3) in the intensive care unit Primary biliary cirrhosis  PLAN: -Appreciate interventional radiology assistance in embolizing culprit vessel, it appears that blood from OG-tube is decreasing in volume, hemoglobin is improved, given amount of blood in stomach on prior endoscopy 07/11/2022, anticipate may have some residual blood per OG tube over next few days -No plan for endoscopy at this time -Trend H/H, transfuse for hemoglobin less than 7 -Continue IV PPI every 12 hours -If hemoglobin remains stable 07/14/2022, will plan to discontinue octreotide as concern for esophageal varices is low -Maintain n.p.o. for now pending OG tube output -Monitor bowel movements -Your ICU care -[name] will follow   LOS: 3 days   Liliane Shilaire Allissa Albright, [facility]

## 2022-07-13 NOTE — Progress Notes (Signed)
eLink Physician-Brief Progress Note Patient Name: Hannah Blake DOB: 08/05/1950 MRN: [id number]   Date of Service  07/13/2022  HPI/Events of Note  Fever - Temp = 100.6 F - Already on Ceftriaxone for SBP prophylaxis. AST and ALT both elevated. Will avoid Tylenol. Will avoid NSAID's with GI bleeding.   eICU Interventions  Plan: Ice packs PRN.      Intervention Category Major Interventions: Other:  Lenell Antu 07/13/2022, 4:35 AM

## 2022-07-13 NOTE — Progress Notes (Signed)
NAME:  Hannah Blake, MRN:  [id number], DOB:  1951/01/10, LOS: 3 ADMISSION DATE:  07/10/2022, CONSULTATION DATE: 12/21 REFERRING MD: Dr. Sherry Ruffing, CHIEF COMPLAINT: Blood in stool   History of Present Illness:  71 y/o F who presented to [facility] ER with reports of bloody stools.   The patient was admitted from 12/16-12/20/23 for hematemesis.  She was admitted with shock in the setting of GIB.  She required 4 units PRBC.  GI consulted and EGD completed showed ulcerations in the esophagus, stomach and duodenum with clots noted throughout the stomach. No significant varices on EGD noted. GI was concerned for possible NSAID use PTA. She required vasopressors, PPI infusion.  Rec's at discharge were for PPI BID x3 months then indefinetly, ursodiol and close follow up. Per chart review, grandson went to pick up ursodiol and it was 550$, and was unable to pick up.  She returns on 12/21 with reports of getting dizzy while having a bowel movement. Family noted blood in toilet. EMS was activated.  Initial evaluation noted her to have a blood pressure of 60/30.  On arrival to ER, RA saturations 83% and was placed on a NRB. Initial labs notable for Hgb 4.9, WBC 34.2, platelets 197. Temp 95.1. Other labs pending.   PCCM called for ICU admission.   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  Intubated, EGD with blood/clot in stomach but unable to visualize area of bleeding. On pressors. 6 units PRBC, 1 pk platelets 12/22 CTA ABD/Pelvis with no findings suspicious for active GIB, distended stomach with suspected intraluminal hematoma, wall thickening of transverse, descending/rectosigmoid colon, moderate abdominopelvic ascites,  cirrhosis. 12/23: CT abdomen pelvis with posterior stomach bleed, large intraluminal hematoma, additional blood products given, more hypotensive, IR and GI discussed case, embolization performed  Interim History / Subjective:  Weaning pressors.  Hemoglobin relatively stable.  Underwent embolization yesterday.  Objective   Blood pressure 129/60, pulse 99, temperature 99 F (37.2 C), resp. rate 20, height 5' (1.524 m), weight 49.6 kg, SpO2 100 %.    Vent Mode: PRVC FiO2 (%):  [40 %-100 %] 40 % Set Rate:  [20 bmp] 20 bmp Vt Set:  [360 mL] 360 mL PEEP:  [5 cmH20] 5 cmH20 Plateau Pressure:  [14 cmH20-15 cmH20] 14 cmH20   Intake/Output Summary (Last 24 hours) at 07/13/2022 0845 Last data filed at 07/13/2022 [id number] Gross per 24 hour  Intake 1945.12 ml  Output 2330 ml  Net -384.88 ml    Filed Weights   07/11/22 0454 07/12/22 0408 07/13/22 0338  Weight: 49.8 kg 50.2 kg 49.6 kg    Examination: General: critically ill appearing adult female lying in bed in NAD on vent HEENT: MM pink/moist, NGT with bright red bloody drainage Neuro: sedate on propofol CV: s1s2 RRR, SR on monitor, no m/r/g PULM: non-labored at rest, lungs bilaterally clear  GI: mildly distended c, bsx4 hypoactive  Extremities: warm/dry, no edema  Skin: no rashes or lesions Access: R fem Aline, L fem TLC   Resolved Hospital Problem list      Assessment & Plan:   Postprocedural ventilator management: Intubated for EGD.  Remains intubated with significant hemodynamic instability. -- PRVC, VAP bundle, PPI as below -- Wean  sedation with improving hemodynamics   Hemorrhagic shock due to acute blood loss anemia from gastrointestinal source: Given report of blood at home.  Recent GI bleed.  Recent evidence of multiple ulcers in the esophagus and stomach.  Massive transfusion protocol 07/10/2022 with improvement in hemoglobin but ongoing hypotension.  First endoscopy at bedside revealed a lot of blood in the stomach, no  clear source.  Initial CTA angio of the abdomen pelvis was unrevealing, repeat AM 12/23 reveals difficult target for embo.  Underwent embolization 12/23 with improved hemodynamics. -- MAP greater than 65, norepinephrine and vasopressin, phenylephrine weaned off -- s/p 10u pRBC and 1u plt documented -- PPI twice daily  --DC IV octreotide times 24 -- Hgb stable -- GI consulted, appreciate assistance -- Status post embolization 12/23, appreciate IR assistance   PBC with cirrhosis: -- Supportive care, ceftriaxone for SBP prophylaxis given history of ascites   GERD: PPI as above   Heart failure with preserved ejection fraction: -- transfuse as needed, cautious IVF    Best Practice (right click and "Reselect all SmartList Selections" daily)  Diet/type: NPO DVT prophylaxis: SCD GI prophylaxis: PPI Lines: Central line and Arterial Line Foley:  Yes, and it is still needed Code Status:  full code Last date of multidisciplinary goals of care discussion: 12/21 - confirmed with grandsons, who indicate they are HCPOA, that she is full code.  Would be accepting of all measures in the event of decline.  See iPAL noted from 12/22 as well, confirms above.   Family updated 12/23   Critical care time:     CRITICAL CARE Performed by: Karren Burly   Total critical care time: 35 minutes  Critical care time was exclusive of separately billable procedures and treating other patients.  Critical care was necessary to treat or prevent imminent or life-threatening deterioration.  Critical care was time spent personally by me on the following activities: development of treatment plan with patient and/or surrogate as well as nursing, discussions with consultants, evaluation of patient's response to treatment, examination of patient, obtaining history from patient or surrogate, ordering and performing treatments and interventions, ordering and review of laboratory studies, ordering and review of  radiographic studies, pulse oximetry and re-evaluation of patient's condition.   Karren Burly, MD [facility] 07/13/2022, 8:45 AM  Please see [email or web address] for pager details.  From 7A-7P if no response, please call [phone] After hours, please call ELink [phone]

## 2022-07-13 NOTE — Plan of Care (Signed)
  Problem: Education: Goal: Knowledge of General Education information will improve Description: Including pain rating scale, medication(s)/side effects and non-pharmacologic comfort measures Outcome: Progressing   Problem: Health Behavior/Discharge Planning: Goal: Ability to manage health-related needs will improve Outcome: Progressing   Problem: Clinical Measurements: Goal: Ability to maintain clinical measurements within normal limits will improve Outcome: Progressing Goal: Will remain free from infection Outcome: Progressing Goal: Diagnostic test results will improve Outcome: Progressing Goal: Respiratory complications will improve Outcome: Progressing Goal: Cardiovascular complication will be avoided Outcome: Progressing   Problem: Activity: Goal: Risk for activity intolerance will decrease Outcome: Progressing   Problem: Nutrition: Goal: Adequate nutrition will be maintained Outcome: Progressing   Problem: Coping: Goal: Level of anxiety will decrease Outcome: Progressing   Problem: Elimination: Goal: Will not experience complications related to bowel motility Outcome: Progressing Goal: Will not experience complications related to urinary retention Outcome: Progressing   Problem: Pain Managment: Goal: General experience of comfort will improve Outcome: Progressing   Problem: Safety: Goal: Ability to remain free from injury will improve Outcome: Progressing   Problem: Skin Integrity: Goal: Risk for impaired skin integrity will decrease Outcome: Progressing   Problem: Education: Goal: Ability to identify signs and symptoms of gastrointestinal bleeding will improve Outcome: Progressing   Problem: Bowel/Gastric: Goal: Will show no signs and symptoms of gastrointestinal bleeding Outcome: Progressing   Problem: Fluid Volume: Goal: Will show no signs and symptoms of excessive bleeding Outcome: Progressing   Problem: Clinical Measurements: Goal:  Complications related to the disease process, condition or treatment will be avoided or minimized Outcome: Progressing   Problem: Education: Goal: Understanding of CV disease, CV risk reduction, and recovery process will improve Outcome: Progressing Goal: Individualized Educational Video(s) Outcome: Progressing   Problem: Activity: Goal: Ability to return to baseline activity level will improve Outcome: Progressing   Problem: Cardiovascular: Goal: Ability to achieve and maintain adequate cardiovascular perfusion will improve Outcome: Progressing Goal: Vascular access site(s) Level 0-1 will be maintained Outcome: Progressing   Problem: Health Behavior/Discharge Planning: Goal: Ability to safely manage health-related needs after discharge will improve Outcome: Progressing   

## 2022-07-13 NOTE — Progress Notes (Signed)
Per CCMD, Hannah Blake verbal order given to defer wake up assessment due to high reliance on pressor support.

## 2022-07-14 ENCOUNTER — Encounter (HOSPITAL_COMMUNITY): Payer: Self-pay | Admitting: Gastroenterology

## 2022-07-14 LAB — GLUCOSE, CAPILLARY
Glucose-Capillary: 106 mg/dL — ABNORMAL HIGH (ref 70–99)
Glucose-Capillary: 106 mg/dL — ABNORMAL HIGH (ref 70–99)
Glucose-Capillary: 110 mg/dL — ABNORMAL HIGH (ref 70–99)
Glucose-Capillary: 111 mg/dL — ABNORMAL HIGH (ref 70–99)
Glucose-Capillary: 116 mg/dL — ABNORMAL HIGH (ref 70–99)
Glucose-Capillary: 94 mg/dL (ref 70–99)

## 2022-07-14 LAB — TYPE AND SCREEN
ABO/RH(D): B POS
Antibody Screen: NEGATIVE
Unit division: 0
Unit division: 0
Unit division: 0
Unit division: 0
Unit division: 0
Unit division: 0
Unit division: 0
Unit division: 0
Unit division: 0
Unit division: 0
Unit division: 0
Unit division: 0
Unit division: 0
Unit division: 0

## 2022-07-14 LAB — BPAM RBC
Blood Product Expiration Date: 202401212359
Blood Product Expiration Date: 202401212359
Blood Product Expiration Date: 202401212359
Blood Product Expiration Date: 202401222359
Blood Product Expiration Date: 202401222359
Blood Product Expiration Date: 202401222359
Blood Product Expiration Date: 202401222359
Blood Product Expiration Date: 202401222359
Blood Product Expiration Date: 202401242359
Blood Product Expiration Date: 202401252359
Blood Product Expiration Date: 202401252359
Blood Product Expiration Date: 202401252359
Blood Product Expiration Date: 202401282359
Blood Product Expiration Date: 202401282359
ISSUE DATE / TIME: 202312211808
ISSUE DATE / TIME: 202312211808
ISSUE DATE / TIME: 202312211808
ISSUE DATE / TIME: 202312212138
ISSUE DATE / TIME: 202312212138
ISSUE DATE / TIME: 202312212159
ISSUE DATE / TIME: 202312212159
ISSUE DATE / TIME: 202312212349
ISSUE DATE / TIME: 202312212349
ISSUE DATE / TIME: 202312230507
ISSUE DATE / TIME: 202312230507
ISSUE DATE / TIME: 202312230751
ISSUE DATE / TIME: 202312240823
ISSUE DATE / TIME: 202312250735
Unit Type and Rh: 5100
Unit Type and Rh: 5100
Unit Type and Rh: 5100
Unit Type and Rh: 5100
Unit Type and Rh: 5100
Unit Type and Rh: 5100
Unit Type and Rh: 5100
Unit Type and Rh: 5100
Unit Type and Rh: 9500
Unit Type and Rh: 9500
Unit Type and Rh: 9500
Unit Type and Rh: 9500
Unit Type and Rh: 9500
Unit Type and Rh: 9500

## 2022-07-14 LAB — BASIC METABOLIC PANEL
Anion gap: 3 — ABNORMAL LOW (ref 5–15)
BUN: 28 mg/dL — ABNORMAL HIGH (ref 8–23)
CO2: 23 mmol/L (ref 22–32)
Calcium: 7.7 mg/dL — ABNORMAL LOW (ref 8.9–10.3)
Chloride: 116 mmol/L — ABNORMAL HIGH (ref 98–111)
Creatinine, Ser: 0.72 mg/dL (ref 0.44–1.00)
GFR, Estimated: >60 mL/min (ref >60)
Glucose, Bld: 121 mg/dL — ABNORMAL HIGH (ref 70–99)
Potassium: 3.7 mmol/L (ref 3.5–5.1)
Sodium: 142 mmol/L (ref 135–145)

## 2022-07-14 LAB — CBC
HCT: 28.2 % — ABNORMAL LOW (ref 36.0–46.0)
Hemoglobin: 9 g/dL — ABNORMAL LOW (ref 12.0–15.0)
MCH: 31.1 pg (ref 26.0–34.0)
MCHC: 31.9 g/dL (ref 30.0–36.0)
MCV: 97.6 fL (ref 80.0–100.0)
Platelets: 137 10*3/uL — ABNORMAL LOW (ref 150–400)
RBC: 2.89 MIL/uL — ABNORMAL LOW (ref 3.87–5.11)
RDW: 21.4 % — ABNORMAL HIGH (ref 11.5–15.5)
WBC: 22 10*3/uL — ABNORMAL HIGH (ref 4.0–10.5)
nRBC: 0.5 % — ABNORMAL HIGH (ref 0.0–0.2)

## 2022-07-14 LAB — BLOOD GAS, ARTERIAL
Acid-base deficit: 4.8 mmol/L — ABNORMAL HIGH (ref 0.0–2.0)
Bicarbonate: 21.6 mmol/L (ref 20.0–28.0)
O2 Saturation: 99.3 %
Patient temperature: 37
pCO2 arterial: 44 mmHg (ref 32–48)
pH, Arterial: 7.3 — ABNORMAL LOW (ref 7.35–7.45)
pO2, Arterial: 94 mmHg (ref 83–108)

## 2022-07-14 LAB — TRIGLYCERIDES: Triglycerides: 138 mg/dL (ref <150)

## 2022-07-14 MED ORDER — SODIUM CHLORIDE 0.9 % IV SOLN
50.0000 ug/h | INTRAVENOUS | Status: DC
  Administered 2022-07-14 – 2022-07-16 (×5): 50 ug/h via INTRAVENOUS
  Filled 2022-07-14 (×6): qty 1

## 2022-07-14 MED ORDER — LORAZEPAM 2 MG/ML IJ SOLN
1.0000 mg | INTRAMUSCULAR | Status: DC | PRN
  Administered 2022-07-14: 1 mg via INTRAVENOUS
  Filled 2022-07-14: qty 1

## 2022-07-14 MED ORDER — DEXMEDETOMIDINE HCL IN NACL 400 MCG/100ML IV SOLN
0.4000 ug/kg/h | INTRAVENOUS | Status: DC
  Administered 2022-07-14: 0.4 ug/kg/h via INTRAVENOUS
  Administered 2022-07-15: 0.7 ug/kg/h via INTRAVENOUS
  Administered 2022-07-15: 0.9 ug/kg/h via INTRAVENOUS
  Administered 2022-07-15: 0.8 ug/kg/h via INTRAVENOUS
  Filled 2022-07-14 (×4): qty 100

## 2022-07-14 MED ORDER — POTASSIUM CHLORIDE 10 MEQ/50ML IV SOLN
10.0000 meq | INTRAVENOUS | Status: AC
  Administered 2022-07-14 (×4): 10 meq via INTRAVENOUS
  Filled 2022-07-14 (×4): qty 50

## 2022-07-14 NOTE — Progress Notes (Signed)
[facility] Progress Note  SUBJECTIVE:   Interval history: Hannah Blake was seen and evaluated today at bedside.  She remains intubated.  Had recently received Versed prior to my examination of her, unable to obtain review of systems.  Per discussion with nursing, had roughly 1000 cc of dark red blood overnight from orogastric tube.  Vasopressor requirements are decreasing.  Hemoglobin this a.m. stable at 9.0 from 9.9 yesterday.  No bowel movements.  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[facility])    Past Surgical History:  Procedure Laterality Date   BIOPSY  01/14/2022   Procedure: BIOPSY;  Surgeon: Kathi Der, MD;  Location: [facility];  Service: Gastroenterology;;   BIOPSY  07/06/2022   Procedure: BIOPSY;  Surgeon: Kathi Der, MD;  Location: [facility];  Service: Gastroenterology;;   CLAVICLE SURGERY     ESOPHAGEAL BANDING  01/14/2022   Procedure: ESOPHAGEAL BANDING;  Surgeon: Kathi Der, MD;  Location: [facility];  Service: Gastroenterology;;   ESOPHAGOGASTRODUODENOSCOPY N/A 01/14/2022   Procedure: ESOPHAGOGASTRODUODENOSCOPY (EGD);  Surgeon: Kathi Der, MD;  Location: [facility];  Service: Gastroenterology;  Laterality: N/A;   ESOPHAGOGASTRODUODENOSCOPY (EGD) WITH PROPOFOL N/A 07/06/2022   Procedure: ESOPHAGOGASTRODUODENOSCOPY (EGD) WITH PROPOFOL;  Surgeon: Kathi Der, MD;  Location: [facility];  Service: Gastroenterology;  Laterality: N/A;   FRACTURE SURGERY     IR ANGIOGRAM SELECTIVE EACH ADDITIONAL VESSEL  07/12/2022   IR ANGIOGRAM VISCERAL SELECTIVE  07/12/2022   IR ANGIOGRAM VISCERAL SELECTIVE  07/12/2022   IR TRANSCATH/EMBOLIZ  07/12/2022   IR US GUIDE VASC ACCESS LEFT  07/12/2022   Current Facility-Administered Medications  Medication Dose Route Frequency Provider Last Rate Last  Admin   0.9 %  sodium chloride infusion (Manually program via Guardrails IV Fluids)   Intravenous Once Yap, Vanessa, MD       0.9 %  sodium chloride infusion (Manually program via Guardrails IV Fluids)   Intravenous Once [name], MD       0.9 %  sodium chloride infusion  250 mL Intravenous Continuous Hunsucker, Lesia Sago, MD 20 mL/hr at 07/12/22 0521 250 mL at 07/12/22 0521   cefTRIAXone (ROCEPHIN) 2 g in sodium chloride 0.9 % 100 mL IVPB  2 g Intravenous q1800 Hunsucker, Lesia Sago, MD   Stopped at 07/13/22 1837   Chlorhexidine Gluconate Cloth 2 % PADS 6 each  6 each Topical Daily Hunsucker, Lesia Sago, MD   6 each at 07/13/22 2136   docusate (COLACE) 50 MG/5ML liquid 100 mg  100 mg Per Tube BID Omar Person, MD       docusate sodium (COLACE) capsule 100 mg  100 mg Oral BID PRN Hunsucker, Lesia Sago, MD       fentaNYL (SUBLIMAZE) injection 25-100 mcg  25-100 mcg Intravenous Q30 min PRN Omar Person, MD   100 mcg at 07/14/22 0655   midazolam (VERSED) injection 2 mg  2 mg Intravenous Q2H PRN Hunsucker, Lesia Sago, MD   2 mg at 07/14/22 6333   norepinephrine (LEVOPHED) 16 mg in (0.064 mg/mL) premix infusion  0-40 mcg/min Intravenous Titrated Hunsucker, Lesia Sago, MD 5.63 mL/hr at 07/14/22 1000 6 mcg/min at 07/14/22 1000   Oral care mouth rinse  15 mL Mouth Rinse Q2H Hunsucker, Lesia Sago, MD   15 mL at 07/14/22 5456   Oral care  mouth rinse  15 mL Mouth Rinse PRN Hunsucker, Lesia SagoMatthew R, MD       pantoprazole (PROTONIX) injection 40 mg  40 mg Intravenous Q12H Hunsucker, Lesia SagoMatthew R, MD   40 mg at 07/14/22 [id number]   phenylephrine CONCENTRATED 100mg  in sodium chloride 0.9% 250mL (0.4mg /mL) premix infusion  0-400 mcg/min Intravenous Titrated Canary Brimllis, Brandi L, NP   Stopped at 07/12/22 1742   polyethylene glycol (MIRALAX / GLYCOLAX) packet 17 g  17 g Oral Daily PRN Hunsucker, Lesia SagoMatthew R, MD       polyethylene glycol (MIRALAX / GLYCOLAX) packet 17 g  17 g Per Tube Daily Omar PersonMeier, Nathaniel M, MD        potassium chloride 10 mEq in 50 mL *CENTRAL LINE* IVPB  10 mEq Intravenous Q1 Hr x 4 Mohan, Kinila T, MD 50 mL/hr at 07/14/22 1000 Infusion Verify at 07/14/22 1000   vasopressin (PITRESSIN) 20 Units in sodium chloride 0.9 % 100 mL infusion-*FOR SHOCK*  0.03 Units/min Intravenous Continuous Omar PersonMeier, Nathaniel M, MD 9 mL/hr at 07/14/22 1000 0.03 Units/min at 07/14/22 1000   Allergies as of 07/10/2022   (No Known Allergies)   Review of Systems:  Review of Systems  Reason unable to perform ROS: Intubated.    OBJECTIVE:   Temp:  [96.4 F (35.8 C)-98.6 F (37 C)] 98.4 F (36.9 C) (12/25 1000) Pulse Rate:  [76-104] 88 (12/25 1000) Resp:  [7-23] 12 (12/25 1000) BP: (98-144)/(39-60) 109/42 (12/25 1000) SpO2:  [96 %-100 %] 100 % (12/25 1000) Arterial Line BP: (103-154)/(39-65) 114/41 (12/25 1000) FiO2 (%):  [30 %-40 %] 30 % (12/25 0730) Weight:  [52.3 kg] 52.3 kg (12/25 0417) Last BM Date : 07/13/22 Physical Exam Cardiovascular:     Rate and Rhythm: Normal rate and regular rhythm.  Pulmonary:     Comments: Intubated and sedated, coarse anterior breath sounds Abdominal:     General: There is no distension.     Palpations: Abdomen is soft.     Comments: Soft bowel sounds appreciated  Musculoskeletal:     Right lower leg: No edema.     Left lower leg: No edema.     Labs: Recent Labs    07/12/22 1300 07/13/22 0511 07/14/22 0357  WBC 24.7* 30.7* 22.0*  HGB 10.2* 9.9* 9.0*  HCT 30.2* 29.7* 28.2*  PLT 109* 144* 137*   BMET Recent Labs    07/12/22 0127 07/13/22 0511 07/14/22 0357  NA 136 139 142  K 3.9 3.5 3.7  CL 110 113* 116*  CO2 21* 19* 23  GLUCOSE 152* 138* 121*  BUN 29* 33* 28*  CREATININE 1.26* 0.97 0.72  CALCIUM 6.7* 7.5* 7.7*   LFT Recent Labs    07/12/22 0127  PROT 4.6*  ALBUMIN 2.7*  AST 262*  ALT 113*  ALKPHOS 118  BILITOT 1.8*   PT/INR Recent Labs    07/11/22 1123 07/12/22 0838  LABPROT 17.1* 17.3*  INR 1.4* 1.4*   Diagnostic imaging: IR  Angiogram Visceral Selective  Result Date: 07/12/2022 INDICATION: Upper GI bleeding, large volume hematemesis, hemorrhagic shock EXAM: 1. Ultrasound-guided puncture of the left common femoral artery 2. Catheterization and angiography of the celiac artery 3. Selective catheterization angiography of the left gastric artery 4. Selective catheterization angiography of a branch of the left gastric artery 5. Embolization of the left gastric artery MEDICATIONS: Documented in the EMR ANESTHESIA/SEDATION: Moderate (conscious) sedation was employed during this procedure. A total of Versed 2 mg and Fentanyl 100 mcg was administered intravenously by the  radiology nurse. Total intra-service moderate Sedation Time: 66 minutes. The patient's level of consciousness and vital signs were monitored continuously by radiology nursing throughout the procedure under my direct supervision. CONTRAST:  145 mL Omnipaque 300 administered intra-arterially FLUOROSCOPY: Radiation Exposure Index (as provided by the fluoroscopic device): 66 minutes (862 mGy) COMPLICATIONS: None immediate. PROCEDURE: Informed consent was obtained from the patient following explanation of the procedure, risks, benefits and alternatives. The patient understands, agrees and consents for the procedure. All questions were addressed. A time out was performed prior to the initiation of the procedure. Maximal barrier sterile technique utilized including caps, mask, sterile gowns, sterile gloves, large sterile drape, hand hygiene, and Betadine prep. The patient was placed supine on the exam table. The left groin was prepped and draped in the standard sterile fashion. Ultrasound was used to evaluate the left common femoral artery, which found to be patent and suitable for access. An ultrasound image was permanently stored in the electronic medical record. Using ultrasound guidance, the left common femoral artery was directly punctured using a 21 gauge micropuncture set. An  018 wire was advanced centrally, followed by serial tract dilation and placement of a 5 French sheath. Using a combination of a C2 catheter and Glidewire, the celiac artery was catheterized. Angiography of the celiac artery was performed in multiple obliquities to study the anatomy, demonstrating expected branching anatomy with particular attention to the left gastric and splenic arteries based on expected location of hemorrhage on CTA imaging. No abnormal vessel branch was identified from the proximal splenic artery in the expected area of the short gastric arteries. There was suspicious appearance of a vessel cutoff/extravasation adjacent to an inferior branch of the left gastric artery. The decision was then made to proceed with selective catheterization of the left gastric artery. Using combination of a 2.8 [place] Progreat Omega microcatheter and fathom 16 microwire, the left gastric artery was selectively catheterized. Additional angiography of the left gastric artery demonstrated similar findings as seen before, with concern for vessel cutoff and active extravasation along a medial branch of the left gastric artery. Microcatheter and wire were then advanced further and the medial branch of the left gastric artery selectively catheterized. Additional angiography of this branch of the left gastric artery confirmed the presence of active gastrointestinal hemorrhage along the medial border of the stomach. This was consistent with findings on recent CTA. The decision was then made to proceed with embolization of this branch of the left gastric artery. Using a combination of 3 mm and packing Ruby coils, this branch of the left gastric artery and the more proximal left gastric artery were successfully embolized. Completion angiography of both the proximal left gastric artery and celiac artery demonstrated no further findings of active bleeding. At the end of the procedure, all catheters and wires were removed.  Hemostasis was achieved at the access site using Angio-Seal vascular closure device after limited hand injection demonstrated appropriate level of access and vessel caliber. Distal pulses were similar. A clean dressing was placed. The patient tolerated the procedure well without immediate complication. IMPRESSION: Mesenteric angiogram of the upper GI vessels demonstrated suspected bleed originating from a distal branch of the left gastric artery, consistent with location of bleed identified on CTA. The left gastric artery was successfully embolized using coils, with no further angiographic findings of continued bleeding at the end of the procedure. Electronically Signed   By: Olive Bass M.D.   On: 07/12/2022 16:46   IR US Guide Vasc Access Left  Result Date: 07/12/2022 INDICATION: Upper GI bleeding, large volume hematemesis, hemorrhagic shock EXAM: 1. Ultrasound-guided puncture of the left common femoral artery 2. Catheterization and angiography of the celiac artery 3. Selective catheterization angiography of the left gastric artery 4. Selective catheterization angiography of a branch of the left gastric artery 5. Embolization of the left gastric artery MEDICATIONS: Documented in the EMR ANESTHESIA/SEDATION: Moderate (conscious) sedation was employed during this procedure. A total of Versed 2 mg and Fentanyl 100 mcg was administered intravenously by the radiology nurse. Total intra-service moderate Sedation Time: 66 minutes. The patient's level of consciousness and vital signs were monitored continuously by radiology nursing throughout the procedure under my direct supervision. CONTRAST:  145 mL Omnipaque 300 administered intra-arterially FLUOROSCOPY: Radiation Exposure Index (as provided by the fluoroscopic device): 66 minutes (862 mGy) COMPLICATIONS: None immediate. PROCEDURE: Informed consent was obtained from the patient following explanation of the procedure, risks, benefits and alternatives. The  patient understands, agrees and consents for the procedure. All questions were addressed. A time out was performed prior to the initiation of the procedure. Maximal barrier sterile technique utilized including caps, mask, sterile gowns, sterile gloves, large sterile drape, hand hygiene, and Betadine prep. The patient was placed supine on the exam table. The left groin was prepped and draped in the standard sterile fashion. Ultrasound was used to evaluate the left common femoral artery, which found to be patent and suitable for access. An ultrasound image was permanently stored in the electronic medical record. Using ultrasound guidance, the left common femoral artery was directly punctured using a 21 gauge micropuncture set. An 018 wire was advanced centrally, followed by serial tract dilation and placement of a 5 French sheath. Using a combination of a C2 catheter and Glidewire, the celiac artery was catheterized. Angiography of the celiac artery was performed in multiple obliquities to study the anatomy, demonstrating expected branching anatomy with particular attention to the left gastric and splenic arteries based on expected location of hemorrhage on CTA imaging. No abnormal vessel branch was identified from the proximal splenic artery in the expected area of the short gastric arteries. There was suspicious appearance of a vessel cutoff/extravasation adjacent to an inferior branch of the left gastric artery. The decision was then made to proceed with selective catheterization of the left gastric artery. Using combination of a 2.8 [place] Progreat Omega microcatheter and fathom 16 microwire, the left gastric artery was selectively catheterized. Additional angiography of the left gastric artery demonstrated similar findings as seen before, with concern for vessel cutoff and active extravasation along a medial branch of the left gastric artery. Microcatheter and wire were then advanced further and the medial branch  of the left gastric artery selectively catheterized. Additional angiography of this branch of the left gastric artery confirmed the presence of active gastrointestinal hemorrhage along the medial border of the stomach. This was consistent with findings on recent CTA. The decision was then made to proceed with embolization of this branch of the left gastric artery. Using a combination of 3 mm and packing Ruby coils, this branch of the left gastric artery and the more proximal left gastric artery were successfully embolized. Completion angiography of both the proximal left gastric artery and celiac artery demonstrated no further findings of active bleeding. At the end of the procedure, all catheters and wires were removed. Hemostasis was achieved at the access site using Angio-Seal vascular closure device after limited hand injection demonstrated appropriate level of access and vessel caliber. Distal pulses were similar. A  clean dressing was placed. The patient tolerated the procedure well without immediate complication. IMPRESSION: Mesenteric angiogram of the upper GI vessels demonstrated suspected bleed originating from a distal branch of the left gastric artery, consistent with location of bleed identified on CTA. The left gastric artery was successfully embolized using coils, with no further angiographic findings of continued bleeding at the end of the procedure. Electronically Signed   By: Olive Bass M.D.   On: 07/12/2022 16:46   IR Transcath/Emboliz  Result Date: 07/12/2022 INDICATION: Upper GI bleeding, large volume hematemesis, hemorrhagic shock EXAM: 1. Ultrasound-guided puncture of the left common femoral artery 2. Catheterization and angiography of the celiac artery 3. Selective catheterization angiography of the left gastric artery 4. Selective catheterization angiography of a branch of the left gastric artery 5. Embolization of the left gastric artery MEDICATIONS: Documented in the EMR  ANESTHESIA/SEDATION: Moderate (conscious) sedation was employed during this procedure. A total of Versed 2 mg and Fentanyl 100 mcg was administered intravenously by the radiology nurse. Total intra-service moderate Sedation Time: 66 minutes. The patient's level of consciousness and vital signs were monitored continuously by radiology nursing throughout the procedure under my direct supervision. CONTRAST:  145 mL Omnipaque 300 administered intra-arterially FLUOROSCOPY: Radiation Exposure Index (as provided by the fluoroscopic device): 66 minutes (862 mGy) COMPLICATIONS: None immediate. PROCEDURE: Informed consent was obtained from the patient following explanation of the procedure, risks, benefits and alternatives. The patient understands, agrees and consents for the procedure. All questions were addressed. A time out was performed prior to the initiation of the procedure. Maximal barrier sterile technique utilized including caps, mask, sterile gowns, sterile gloves, large sterile drape, hand hygiene, and Betadine prep. The patient was placed supine on the exam table. The left groin was prepped and draped in the standard sterile fashion. Ultrasound was used to evaluate the left common femoral artery, which found to be patent and suitable for access. An ultrasound image was permanently stored in the electronic medical record. Using ultrasound guidance, the left common femoral artery was directly punctured using a 21 gauge micropuncture set. An 018 wire was advanced centrally, followed by serial tract dilation and placement of a 5 French sheath. Using a combination of a C2 catheter and Glidewire, the celiac artery was catheterized. Angiography of the celiac artery was performed in multiple obliquities to study the anatomy, demonstrating expected branching anatomy with particular attention to the left gastric and splenic arteries based on expected location of hemorrhage on CTA imaging. No abnormal vessel branch was  identified from the proximal splenic artery in the expected area of the short gastric arteries. There was suspicious appearance of a vessel cutoff/extravasation adjacent to an inferior branch of the left gastric artery. The decision was then made to proceed with selective catheterization of the left gastric artery. Using combination of a 2.8 [place] Progreat Omega microcatheter and fathom 16 microwire, the left gastric artery was selectively catheterized. Additional angiography of the left gastric artery demonstrated similar findings as seen before, with concern for vessel cutoff and active extravasation along a medial branch of the left gastric artery. Microcatheter and wire were then advanced further and the medial branch of the left gastric artery selectively catheterized. Additional angiography of this branch of the left gastric artery confirmed the presence of active gastrointestinal hemorrhage along the medial border of the stomach. This was consistent with findings on recent CTA. The decision was then made to proceed with embolization of this branch of the left gastric artery. Using a combination  of 3 mm and packing Ruby coils, this branch of the left gastric artery and the more proximal left gastric artery were successfully embolized. Completion angiography of both the proximal left gastric artery and celiac artery demonstrated no further findings of active bleeding. At the end of the procedure, all catheters and wires were removed. Hemostasis was achieved at the access site using Angio-Seal vascular closure device after limited hand injection demonstrated appropriate level of access and vessel caliber. Distal pulses were similar. A clean dressing was placed. The patient tolerated the procedure well without immediate complication. IMPRESSION: Mesenteric angiogram of the upper GI vessels demonstrated suspected bleed originating from a distal branch of the left gastric artery, consistent with location of bleed  identified on CTA. The left gastric artery was successfully embolized using coils, with no further angiographic findings of continued bleeding at the end of the procedure. Electronically Signed   By: Olive Bass M.D.   On: 07/12/2022 16:46   IR Angiogram Visceral Selective  Result Date: 07/12/2022 INDICATION: Upper GI bleeding, large volume hematemesis, hemorrhagic shock EXAM: 1. Ultrasound-guided puncture of the left common femoral artery 2. Catheterization and angiography of the celiac artery 3. Selective catheterization angiography of the left gastric artery 4. Selective catheterization angiography of a branch of the left gastric artery 5. Embolization of the left gastric artery MEDICATIONS: Documented in the EMR ANESTHESIA/SEDATION: Moderate (conscious) sedation was employed during this procedure. A total of Versed 2 mg and Fentanyl 100 mcg was administered intravenously by the radiology nurse. Total intra-service moderate Sedation Time: 66 minutes. The patient's level of consciousness and vital signs were monitored continuously by radiology nursing throughout the procedure under my direct supervision. CONTRAST:  145 mL Omnipaque 300 administered intra-arterially FLUOROSCOPY: Radiation Exposure Index (as provided by the fluoroscopic device): 66 minutes (862 mGy) COMPLICATIONS: None immediate. PROCEDURE: Informed consent was obtained from the patient following explanation of the procedure, risks, benefits and alternatives. The patient understands, agrees and consents for the procedure. All questions were addressed. A time out was performed prior to the initiation of the procedure. Maximal barrier sterile technique utilized including caps, mask, sterile gowns, sterile gloves, large sterile drape, hand hygiene, and Betadine prep. The patient was placed supine on the exam table. The left groin was prepped and draped in the standard sterile fashion. Ultrasound was used to evaluate the left common femoral  artery, which found to be patent and suitable for access. An ultrasound image was permanently stored in the electronic medical record. Using ultrasound guidance, the left common femoral artery was directly punctured using a 21 gauge micropuncture set. An 018 wire was advanced centrally, followed by serial tract dilation and placement of a 5 French sheath. Using a combination of a C2 catheter and Glidewire, the celiac artery was catheterized. Angiography of the celiac artery was performed in multiple obliquities to study the anatomy, demonstrating expected branching anatomy with particular attention to the left gastric and splenic arteries based on expected location of hemorrhage on CTA imaging. No abnormal vessel branch was identified from the proximal splenic artery in the expected area of the short gastric arteries. There was suspicious appearance of a vessel cutoff/extravasation adjacent to an inferior branch of the left gastric artery. The decision was then made to proceed with selective catheterization of the left gastric artery. Using combination of a 2.8 [place] Progreat Omega microcatheter and fathom 16 microwire, the left gastric artery was selectively catheterized. Additional angiography of the left gastric artery demonstrated similar findings as seen before, with  concern for vessel cutoff and active extravasation along a medial branch of the left gastric artery. Microcatheter and wire were then advanced further and the medial branch of the left gastric artery selectively catheterized. Additional angiography of this branch of the left gastric artery confirmed the presence of active gastrointestinal hemorrhage along the medial border of the stomach. This was consistent with findings on recent CTA. The decision was then made to proceed with embolization of this branch of the left gastric artery. Using a combination of 3 mm and packing Ruby coils, this branch of the left gastric artery and the more proximal  left gastric artery were successfully embolized. Completion angiography of both the proximal left gastric artery and celiac artery demonstrated no further findings of active bleeding. At the end of the procedure, all catheters and wires were removed. Hemostasis was achieved at the access site using Angio-Seal vascular closure device after limited hand injection demonstrated appropriate level of access and vessel caliber. Distal pulses were similar. A clean dressing was placed. The patient tolerated the procedure well without immediate complication. IMPRESSION: Mesenteric angiogram of the upper GI vessels demonstrated suspected bleed originating from a distal branch of the left gastric artery, consistent with location of bleed identified on CTA. The left gastric artery was successfully embolized using coils, with no further angiographic findings of continued bleeding at the end of the procedure. Electronically Signed   By: Olive Bass M.D.   On: 07/12/2022 16:46   IR Angiogram Selective Each Additional Vessel  Result Date: 07/12/2022 INDICATION: Upper GI bleeding, large volume hematemesis, hemorrhagic shock EXAM: 1. Ultrasound-guided puncture of the left common femoral artery 2. Catheterization and angiography of the celiac artery 3. Selective catheterization angiography of the left gastric artery 4. Selective catheterization angiography of a branch of the left gastric artery 5. Embolization of the left gastric artery MEDICATIONS: Documented in the EMR ANESTHESIA/SEDATION: Moderate (conscious) sedation was employed during this procedure. A total of Versed 2 mg and Fentanyl 100 mcg was administered intravenously by the radiology nurse. Total intra-service moderate Sedation Time: 66 minutes. The patient's level of consciousness and vital signs were monitored continuously by radiology nursing throughout the procedure under my direct supervision. CONTRAST:  145 mL Omnipaque 300 administered intra-arterially  FLUOROSCOPY: Radiation Exposure Index (as provided by the fluoroscopic device): 66 minutes (862 mGy) COMPLICATIONS: None immediate. PROCEDURE: Informed consent was obtained from the patient following explanation of the procedure, risks, benefits and alternatives. The patient understands, agrees and consents for the procedure. All questions were addressed. A time out was performed prior to the initiation of the procedure. Maximal barrier sterile technique utilized including caps, mask, sterile gowns, sterile gloves, large sterile drape, hand hygiene, and Betadine prep. The patient was placed supine on the exam table. The left groin was prepped and draped in the standard sterile fashion. Ultrasound was used to evaluate the left common femoral artery, which found to be patent and suitable for access. An ultrasound image was permanently stored in the electronic medical record. Using ultrasound guidance, the left common femoral artery was directly punctured using a 21 gauge micropuncture set. An 018 wire was advanced centrally, followed by serial tract dilation and placement of a 5 French sheath. Using a combination of a C2 catheter and Glidewire, the celiac artery was catheterized. Angiography of the celiac artery was performed in multiple obliquities to study the anatomy, demonstrating expected branching anatomy with particular attention to the left gastric and splenic arteries based on expected location of hemorrhage on CTA  imaging. No abnormal vessel branch was identified from the proximal splenic artery in the expected area of the short gastric arteries. There was suspicious appearance of a vessel cutoff/extravasation adjacent to an inferior branch of the left gastric artery. The decision was then made to proceed with selective catheterization of the left gastric artery. Using combination of a 2.8 [place] Progreat Omega microcatheter and fathom 16 microwire, the left gastric artery was selectively catheterized.  Additional angiography of the left gastric artery demonstrated similar findings as seen before, with concern for vessel cutoff and active extravasation along a medial branch of the left gastric artery. Microcatheter and wire were then advanced further and the medial branch of the left gastric artery selectively catheterized. Additional angiography of this branch of the left gastric artery confirmed the presence of active gastrointestinal hemorrhage along the medial border of the stomach. This was consistent with findings on recent CTA. The decision was then made to proceed with embolization of this branch of the left gastric artery. Using a combination of 3 mm and packing Ruby coils, this branch of the left gastric artery and the more proximal left gastric artery were successfully embolized. Completion angiography of both the proximal left gastric artery and celiac artery demonstrated no further findings of active bleeding. At the end of the procedure, all catheters and wires were removed. Hemostasis was achieved at the access site using Angio-Seal vascular closure device after limited hand injection demonstrated appropriate level of access and vessel caliber. Distal pulses were similar. A clean dressing was placed. The patient tolerated the procedure well without immediate complication. IMPRESSION: Mesenteric angiogram of the upper GI vessels demonstrated suspected bleed originating from a distal branch of the left gastric artery, consistent with location of bleed identified on CTA. The left gastric artery was successfully embolized using coils, with no further angiographic findings of continued bleeding at the end of the procedure. Electronically Signed   By: Olive Bass M.D.   On: 07/12/2022 16:46    IMPRESSION: Hematemesis, continued -EGD 07/06/2022 with findings of few linear esophageal ulcers with stigmata of recent bleeding, nonbleeding superficial gastric ulcer 15 mm dimension, 2 nonbleeding  superficial gastric ulcers 8 mm dimension, few nonbleeding superficial duodenal ulcers no stigmata of bleeding             -EGD 07/11/2022 with findings of red blood in upper third of esophagus, clotted blood in entire examined stomach, red blood in entire duodenum, mucosal visualization limited, no obvious esophageal varices noted             -CT angiogram 07/12/2022 with findings of small focus of active intraluminal extravasation from posterior wall of stomach             -S/p interventional radiology angiogram 07/12/2022 with embolization of bleeding vessel (branch of left gastric artery) Normocytic, acute blood loss anemia, stable Hypovolemic shock, improved not resolved             -Remains intubated and on vasopressor therapy Primary biliary cirrhosis  PLAN: -Relatively large volume of dark blood from oral gastric tube per nursing staff, no bloody bowel movements, hemoglobin relatively stable given volume of blood loss, vasopressor requirements are decreasing -Had discontinued octreotide as low suspicion for bleeding from esophageal varices, though in setting of increased OG blood will resume -Blood per OG tube may be secondary to residual blood in stomach noted during procedure, though this has been few days -Given improving hemodynamics and relatively stable hemoglobin, will wait on further endoscopy -Continue pantoprazole  40 mg IV every 12 hours -Trend H/H, transfuse for hemoglobin less than 7 -Monitor OG tube output -Your ICU care -[name] will follow   LOS: 4 days   Liliane Shi, [facility]

## 2022-07-14 NOTE — Progress Notes (Signed)
NAME:  Hannah Blake, MRN:  [id number], DOB:  31-Jan-1951, LOS: 4 ADMISSION DATE:  07/10/2022, CONSULTATION DATE: 12/21 REFERRING MD: Dr. Rush Landmark, CHIEF COMPLAINT: Blood in stool   History of Present Illness:  71 y/o F who presented to [facility] ER with reports of bloody stools.   The patient was admitted from 12/16-12/20/23 for hematemesis.  She was admitted with shock in the setting of GIB.  She required 4 units PRBC.  GI consulted and EGD completed showed ulcerations in the esophagus, stomach and duodenum with clots noted throughout the stomach. No significant varices on EGD noted. GI was concerned for possible NSAID use PTA. She required vasopressors, PPI infusion.  Rec's at discharge were for PPI BID x3 months then indefinetly, ursodiol and close follow up. Per chart review, grandson went to pick up ursodiol and it was 550$, and was unable to pick up.  She returns on 12/21 with reports of getting dizzy while having a bowel movement. Family noted blood in toilet. EMS was activated.  Initial evaluation noted her to have a blood pressure of 60/30.  On arrival to ER, RA saturations 83% and was placed on a NRB. Initial labs notable for Hgb 4.9, WBC 34.2, platelets 197. Temp 95.1. Other labs pending.   PCCM called for ICU admission.   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  Intubated, EGD with blood/clot in stomach but unable to visualize area of bleeding. On pressors. 6 units PRBC, 1 pk platelets 12/22 CTA ABD/Pelvis with no findings suspicious for active GIB, distended stomach with suspected intraluminal hematoma, wall thickening of transverse, descending/rectosigmoid colon, moderate abdominopelvic ascites,  cirrhosis. 12/23: CT abdomen pelvis with posterior stomach bleed, large intraluminal hematoma, additional blood products given, more hypotensive, IR and GI discussed case, embolization performed 12/24 weaning pressors, propofol stopped  Interim History / Subjective:  Weaning pressors.  Hemoglobin relatively stable.  Plan trial PSV.  Received push sedation at shift change, currently apneic.  Objective   Blood pressure (!) 104/43, pulse 82, temperature (!) 97.3 F (36.3 C), resp. rate 13, height 5' (1.524 m), weight 52.3 kg, SpO2 100 %. CVP:  [3 mmHg-8 mmHg] 8 mmHg  Vent Mode: PRVC FiO2 (%):  [30 %-40 %] 30 % Set Rate:  [14 bmp-20 bmp] 14 bmp Vt Set:  [360 mL] 360 mL PEEP:  [5 cmH20] 5 cmH20 Plateau Pressure:  [11 cmH20-20 cmH20] 16 cmH20   Intake/Output Summary (Last 24 hours) at 07/14/2022 1517 Last data filed at 07/14/2022 0800 Gross per 24 hour  Intake 1394.87 ml  Output 2207 ml  Net -812.13 ml    Filed Weights   07/12/22 0408 07/13/22 0338 07/14/22 0417  Weight: 50.2 kg 49.6 kg 52.3 kg    Examination: General: critically ill appearing adult female lying in bed in NAD on vent HEENT: MM pink/moist, NGT with bright red bloody drainage Neuro: sedate on propofol CV: s1s2 RRR, SR on monitor, no m/r/g PULM: non-labored at rest, lungs bilaterally clear  GI: mildly distended c, bsx4 hypoactive  Extremities: warm/dry, no edema  Skin: no rashes or lesions Access: R fem Aline, L fem TLC   Resolved Hospital Problem list      Assessment & Plan:  Postprocedural ventilator management: Intubated for EGD.  Remains intubated with significant hemodynamic instability. -- PRVC, VAP bundle, PPI as below -- Wean sedation, PSV, SBT as able   Hemorrhagic shock due to acute blood loss anemia from gastrointestinal source: Given report of blood at home.  Recent GI bleed.  Recent evidence of multiple ulcers in the esophagus and stomach.  Massive transfusion protocol 07/10/2022 with  improvement in hemoglobin but ongoing hypotension.  First endoscopy at bedside revealed a lot of blood in the stomach, no clear source.  Initial CTA angio of the abdomen pelvis was unrevealing, repeat AM 12/23 reveals difficult target for embo.  Underwent embolization 12/23 with improved hemodynamics. -- MAP greater than 65, norepinephrine and vasopressin, phenylephrine weaned off -- s/p 10u pRBC and 1u plt documented -- PPI twice daily  --DC IV octreotide 06/2423 -- Hgb stable -- GI consulted, appreciate assistance -- Status post embolization 12/23, appreciate IR assistance   PBC with cirrhosis: -- Supportive care, ceftriaxone for SBP prophylaxis given history of ascites   GERD: PPI as above   Heart failure with preserved ejection fraction: -- transfuse as needed, cautious IVF    Best Practice (right click and "Reselect all SmartList Selections" daily)  Diet/type: NPO DVT prophylaxis: SCD GI prophylaxis: PPI Lines: Central line and Arterial Line Foley:  Yes, and it is still needed Code Status:  full code Last date of multidisciplinary goals of care discussion: 12/21 - confirmed with grandsons, who indicate they are HCPOA, that she is full code.  Would be accepting of all measures in the event of decline.  See iPAL noted from 12/22 as well, confirms above.   Family updated 12/23   Critical care time:     CRITICAL CARE Performed by: Lanier Clam   Total critical care time: 33 minutes  Critical care time was exclusive of separately billable procedures and treating other patients.  Critical care was necessary to treat or prevent imminent or life-threatening deterioration.  Critical care was time spent personally by me on the following activities: development of treatment plan with patient and/or surrogate as well as nursing, discussions with consultants, evaluation of patient's response to treatment, examination of patient, obtaining history from patient or surrogate,  ordering and performing treatments and interventions, ordering and review of laboratory studies, ordering and review of radiographic studies, pulse oximetry and re-evaluation of patient's condition.   Lanier Clam, MD [facility] 07/14/2022, 9:53 AM  Please see [email or web address] for pager details.  From 7A-7P if no response, please call [phone] After hours, please call ELink [phone]

## 2022-07-14 NOTE — Progress Notes (Signed)
[facility] ADULT ICU REPLACEMENT PROTOCOL   The patient does apply for the [facility] Adult ICU Electrolyte Replacment Protocol based on the criteria listed below:   1.Exclusion criteria: TCTS, ECMO, Dialysis, and Myasthenia Gravis patients 2. Is GFR >/= 30 ml/min? Yes.    Patient's GFR today is >60 3. Is SCr </= 2? Yes.   Patient's SCr is 0.72 mg/dL 4. Did SCr increase >/= 0.5 in 24 hours? No. 5.Pt's weight >40kg  Yes.   6. Abnormal electrolyte(s): K  7. Electrolytes replaced per protocol 8.  Call MD STAT for K+ </= 2.5, Phos </= 1, or Mag </= 1 Physician:  Alpha Gula Detar [place] 07/14/2022 6:13 AM

## 2022-07-15 ENCOUNTER — Encounter (HOSPITAL_COMMUNITY): Payer: Self-pay | Admitting: Radiology

## 2022-07-15 LAB — BASIC METABOLIC PANEL
Anion gap: 4 — ABNORMAL LOW (ref 5–15)
BUN: 24 mg/dL — ABNORMAL HIGH (ref 8–23)
CO2: 23 mmol/L (ref 22–32)
Calcium: 8 mg/dL — ABNORMAL LOW (ref 8.9–10.3)
Chloride: 116 mmol/L — ABNORMAL HIGH (ref 98–111)
Creatinine, Ser: 0.65 mg/dL (ref 0.44–1.00)
GFR, Estimated: >60 mL/min (ref >60)
Glucose, Bld: 124 mg/dL — ABNORMAL HIGH (ref 70–99)
Potassium: 3.8 mmol/L (ref 3.5–5.1)
Sodium: 143 mmol/L (ref 135–145)

## 2022-07-15 LAB — CBC WITH DIFFERENTIAL/PLATELET
Abs Immature Granulocytes: 0.07 10*3/uL (ref 0.00–0.07)
Basophils Absolute: 0 10*3/uL (ref 0.0–0.1)
Basophils Relative: 0 %
Eosinophils Absolute: 0.3 10*3/uL (ref 0.0–0.5)
Eosinophils Relative: 3 %
HCT: 25.8 % — ABNORMAL LOW (ref 36.0–46.0)
Hemoglobin: 8 g/dL — ABNORMAL LOW (ref 12.0–15.0)
Immature Granulocytes: 1 %
Lymphocytes Relative: 14 %
Lymphs Abs: 1.3 10*3/uL (ref 0.7–4.0)
MCH: 31.5 pg (ref 26.0–34.0)
MCHC: 31 g/dL (ref 30.0–36.0)
MCV: 101.6 fL — ABNORMAL HIGH (ref 80.0–100.0)
Monocytes Absolute: 0.7 10*3/uL (ref 0.1–1.0)
Monocytes Relative: 8 %
Neutro Abs: 7 10*3/uL (ref 1.7–7.7)
Neutrophils Relative %: 74 %
Platelets: 99 10*3/uL — ABNORMAL LOW (ref 150–400)
RBC: 2.54 MIL/uL — ABNORMAL LOW (ref 3.87–5.11)
RDW: 23.1 % — ABNORMAL HIGH (ref 11.5–15.5)
WBC: 9.5 10*3/uL (ref 4.0–10.5)
nRBC: 1.1 % — ABNORMAL HIGH (ref 0.0–0.2)

## 2022-07-15 LAB — GLUCOSE, CAPILLARY
Glucose-Capillary: 100 mg/dL — ABNORMAL HIGH (ref 70–99)
Glucose-Capillary: 112 mg/dL — ABNORMAL HIGH (ref 70–99)
Glucose-Capillary: 115 mg/dL — ABNORMAL HIGH (ref 70–99)
Glucose-Capillary: 94 mg/dL (ref 70–99)
Glucose-Capillary: 96 mg/dL (ref 70–99)
Glucose-Capillary: 99 mg/dL (ref 70–99)

## 2022-07-15 LAB — CBC
HCT: 28.1 % — ABNORMAL LOW (ref 36.0–46.0)
Hemoglobin: 8.6 g/dL — ABNORMAL LOW (ref 12.0–15.0)
MCH: 31.6 pg (ref 26.0–34.0)
MCHC: 30.6 g/dL (ref 30.0–36.0)
MCV: 103.3 fL — ABNORMAL HIGH (ref 80.0–100.0)
Platelets: 115 10*3/uL — ABNORMAL LOW (ref 150–400)
RBC: 2.72 MIL/uL — ABNORMAL LOW (ref 3.87–5.11)
RDW: 23.6 % — ABNORMAL HIGH (ref 11.5–15.5)
WBC: 11.9 10*3/uL — ABNORMAL HIGH (ref 4.0–10.5)
nRBC: 0.3 % — ABNORMAL HIGH (ref 0.0–0.2)

## 2022-07-15 LAB — TYPE AND SCREEN
ABO/RH(D): B POS
Antibody Screen: NEGATIVE

## 2022-07-15 NOTE — Progress Notes (Signed)
eLink Physician-Brief Progress Note Patient Name: Hannah Blake DOB: [birth year] MRN: [id number]   Date of Service  07/15/2022  HPI/Events of Note  Patient needs restraints order renewed for safety.  eICU Interventions  Order renewed.        Thomasene Lot Danielle Mink 07/15/2022, 11:29 PM

## 2022-07-15 NOTE — Progress Notes (Signed)
[facility] Gastroenterology Progress Note  Hannah Blake 71 y.o. 18-Apr-1951  CC:  GI bleed   Subjective: Patient seen at bedside. Remains intubated. No family at bedside. Nursing staff reports 300cc  of dark red blood via orogastric tube. Hgb slowing dropping 8.0 today (9.0 yesterday)  ROS : Review of Systems  Unable to perform ROS: Critical illness      Objective: Vital signs in last 24 hours: Vitals:   07/15/22 0900 07/15/22 1000  BP: (!) 132/54 (!) 100/43  Pulse: 71 83  Resp: 15 15  Temp: 98.2 F (36.8 C) 98.2 F (36.8 C)  SpO2: 100% 100%    Physical Exam:  General:  Intubated and sedated  Head:  Normocephalic, without obvious abnormality, atraumatic  Lungs:   Coarse anterior breath sounds  Heart:  Regular rate and rhythm, S1, S2 normal  Abdomen:   Soft, non-tender, bowel sounds active all four quadrants,  no masses,     Lab Results: Recent Labs    07/13/22 0511 07/14/22 0357 07/15/22 0306  NA 139 142 143  K 3.5 3.7 3.8  CL 113* 116* 116*  CO2 19* 23 23  GLUCOSE 138* 121* 124*  BUN 33* 28* 24*  CREATININE 0.97 0.72 0.65  CALCIUM 7.5* 7.7* 8.0*  MG 2.0  --   --   PHOS 3.5  --   --    No results for input(s): "AST", "ALT", "ALKPHOS", "BILITOT", "PROT", "ALBUMIN" in the last 72 hours. Recent Labs    07/14/22 0357 07/15/22 0306  WBC 22.0* 9.5  NEUTROABS  --  7.0  HGB 9.0* 8.0*  HCT 28.2* 25.8*  MCV 97.6 101.6*  PLT 137* 99*   No results for input(s): "LABPROT", "INR" in the last 72 hours.    Assessment Hematemesis  - hgb 8.0 (9.0). 300cc dark red blood via orogastric tube -EGD 07/06/2022 with findings of few linear esophageal ulcers with stigmata of recent bleeding, nonbleeding superficial gastric ulcer 15 mm dimension, 2 nonbleeding superficial gastric ulcers 8 mm dimension, few nonbleeding superficial duodenal ulcers no stigmata of bleeding             -EGD 07/11/2022 with findings of red blood in upper third of esophagus, clotted blood in entire  examined stomach, red blood in entire duodenum, mucosal visualization limited, no obvious esophageal varices noted             -CT angiogram 07/12/2022 with findings of small focus of active intraluminal extravasation from posterior wall of stomach             -S/p interventional radiology angiogram 07/12/2022 with embolization of bleeding vessel (branch of left gastric artery) Normocytic, acute blood loss anemia, stable Hypovolemic shock, improved not resolved             -Remains intubated and on vasopressor therapy Primary biliary cirrhosis   Plan: Continued bleeding via orogastric tube with 300cc, no blood per rectum noted. Continued drop in hgb from 9.0 to 8.0. Continue protonix IV BID Continue daily CBC and transfuse as needed to maintain HGB > 7  Monitor OG tube output. Multiple previous EGDs with continued blood loss, may need to consider repeat bedside EGD. Will discuss with MD [name] will follow  Legrand Como PA-C 07/15/2022, 11:16 AM  Contact #  [phone]

## 2022-07-15 NOTE — Progress Notes (Signed)
NAME:  Hannah Blake, MRN:  [id number], DOB:  [birth year], LOS: 5 ADMISSION DATE:  07/10/2022, CONSULTATION DATE: 12/21 REFERRING MD: Dr. Rush Landmark, CHIEF COMPLAINT: Blood in stool   History of Present Illness:  71 y/o F who presented to [facility] ER with reports of bloody stools.   The patient was admitted from 12/16-12/20/23 for hematemesis.  She was admitted with shock in the setting of GIB.  She required 4 units PRBC.  GI consulted and EGD completed showed ulcerations in the esophagus, stomach and duodenum with clots noted throughout the stomach. No significant varices on EGD noted. GI was concerned for possible NSAID use PTA. She required vasopressors, PPI infusion.  Rec's at discharge were for PPI BID x3 months then indefinetly, ursodiol and close follow up. Per chart review, grandson went to pick up ursodiol and it was 550$, and was unable to pick up.  She returns on 12/21 with reports of getting dizzy while having a bowel movement. Family noted blood in toilet. EMS was activated.  Initial evaluation noted her to have a blood pressure of 60/30.  On arrival to ER, RA saturations 83% and was placed on a NRB. Initial labs notable for Hgb 4.9, WBC 34.2, platelets 197. Temp 95.1. Other labs pending.   PCCM called for ICU admission.   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  Intubated, EGD with blood/clot in stomach but unable to visualize area of bleeding. On pressors. 6 units PRBC, 1 pk platelets 12/22 CTA ABD/Pelvis with no findings suspicious for active GIB, distended stomach with suspected intraluminal hematoma, wall thickening of transverse, descending/rectosigmoid colon, moderate abdominopelvic ascites,  cirrhosis. 12/23: CT abdomen pelvis with posterior stomach bleed, large intraluminal hematoma, additional blood products given, more hypotensive, IR and GI discussed case, embolization performed 12/24 weaning pressors, propofol stopped  Interim History / Subjective:   Hypotensive this morning requiring low-dose norepinephrine.  Currently weaning off of this. OG tube with bloody output.  Objective   Blood pressure (!) 110/47, pulse 65, temperature 98.1 F (36.7 C), resp. rate 15, height 5' (1.524 m), weight 52.3 kg, SpO2 100 %.    Vent Mode: SIMV/PC/PS FiO2 (%):  [30 %] 30 % Set Rate:  [10 bmp-14 bmp] 10 bmp Vt Set:  [360 mL] 360 mL PEEP:  [5 cmH20] 5 cmH20 Pressure Support:  [5 cmH20] 5 cmH20 Plateau Pressure:  [10 cmH20-17 cmH20] 13 cmH20   Intake/Output Summary (Last 24 hours) at 07/15/2022 0902 Last data filed at 07/15/2022 6270 Gross per 24 hour  Intake 948.26 ml  Output 1980 ml  Net -1031.74 ml   Filed Weights   07/13/22 0338 07/14/22 0417 07/15/22 0449  Weight: 49.6 kg 52.3 kg 52.3 kg    Examination: General: Chronically ill-appearing elderly female critically ill intubated on mechanical life support HEENT: Endotracheal tube in place Neuro: Sedated CV: Regular rate rhythm, S1-S2 PULM: Bilateral mechanically ventilated breath sounds GI: Soft nontender nondistended Extremities: No significant edema Skin: No rash  Resolved Hospital Problem list      Assessment & Plan:   Postprocedural ventilator management: Intubated for EGD.  Remains intubated with significant hemodynamic instability. -- Remains on adult vent protocol, VAP prophylaxis Wean sedation to maintain RASS goal 0  to -1 - SBT SAT once ready.   Hemorrhagic shock due to acute blood loss anemia from gastrointestinal source: Given report of blood at home.  Recent GI bleed.  Recent evidence of multiple ulcers in the esophagus and stomach.  Massive transfusion protocol 07/10/2022 with improvement in  hemoglobin but ongoing hypotension.  First endoscopy at bedside revealed a lot of blood in the stomach, no clear source.  Initial CTA angio of the abdomen pelvis was unrevealing, repeat AM 12/23 reveals difficult target for embo.  Underwent embolization 12/23 with improved hemodynamics. Plan: Remains on low-dose norepinephrine this morning, will wean off Status post 10 units PRBCs and 1 unit platelets, trending H&H which is slowly coming down over the past 3 days She did have about 300 cc bloody output from OG tube. Continue PPI twice daily Continue octreotide Appreciate GI input. Recheck H&H this afternoon if continued to fall and remains on pressors may give an additional unit of PRBCs.  PBC with cirrhosis: -- Supportive care, ceftriaxone for SBP prophylaxis   GERD: PPI   Heart failure with preserved ejection fraction: -- Transfuse as needed - May need diuresis to maintain euvolemia, appears euvolemic on exam    Best Practice (right click and "Reselect all SmartList Selections" daily)  Diet/type: NPO DVT prophylaxis: SCD GI prophylaxis: PPI Lines: Central line and Arterial Line Foley:  Yes, and it is still needed Code Status:  full code Last date of multidisciplinary goals of care discussion: 12/21 - confirmed with grandsons, who indicate they are HCPOA, that she is full code.  Would be accepting of all measures in the event of decline.  See iPAL noted from 12/22 as well, confirms above.   We will reach out to patient's family today.  This patient is critically ill with multiple organ system failure; which, requires frequent high complexity decision making, assessment, support, evaluation, and titration of therapies. This was completed through the application of advanced monitoring technologies and extensive interpretation of multiple databases. During this encounter critical care time was devoted to patient care services described in this note for 31 minutes.  Josephine Igo,  DO [facility] 07/15/2022 9:02 AM

## 2022-07-15 NOTE — Progress Notes (Signed)
Pt weaned for 6 hours on PS 10. Tolerated well.

## 2022-07-15 NOTE — Progress Notes (Signed)
eLink Physician-Brief Progress Note Patient Name: Hannah Blake DOB: [birth year] MRN: [id number]   Date of Service  07/15/2022  HPI/Events of Note  Patient intubated and on the ventilator.  eICU Interventions  Restraints renewed to prevent self-extubation.        Hannah Blake 07/15/2022, 7:56 PM

## 2022-07-16 DIAGNOSIS — Z515 Encounter for palliative care: Secondary | ICD-10-CM

## 2022-07-16 DIAGNOSIS — R578 Other shock: Secondary | ICD-10-CM

## 2022-07-16 DIAGNOSIS — Z7189 Other specified counseling: Secondary | ICD-10-CM

## 2022-07-16 LAB — BPAM FFP
Blood Product Expiration Date: 202312262359
Blood Product Expiration Date: 202312262359
Blood Product Expiration Date: 202312262359
Blood Product Expiration Date: 202312262359
Blood Product Expiration Date: 202312262359
ISSUE DATE / TIME: 202312211845
ISSUE DATE / TIME: 202312211845
ISSUE DATE / TIME: 202312211845
Unit Type and Rh: 7300
Unit Type and Rh: 7300
Unit Type and Rh: 8400
Unit Type and Rh: 8400
Unit Type and Rh: 8400

## 2022-07-16 LAB — BASIC METABOLIC PANEL
Anion gap: 6 (ref 5–15)
BUN: 18 mg/dL (ref 8–23)
CO2: 22 mmol/L (ref 22–32)
Calcium: 8.1 mg/dL — ABNORMAL LOW (ref 8.9–10.3)
Chloride: 115 mmol/L — ABNORMAL HIGH (ref 98–111)
Creatinine, Ser: 0.64 mg/dL (ref 0.44–1.00)
GFR, Estimated: >60 mL/min (ref >60)
Glucose, Bld: 108 mg/dL — ABNORMAL HIGH (ref 70–99)
Potassium: 3.6 mmol/L (ref 3.5–5.1)
Sodium: 143 mmol/L (ref 135–145)

## 2022-07-16 LAB — GLUCOSE, CAPILLARY
Glucose-Capillary: 102 mg/dL — ABNORMAL HIGH (ref 70–99)
Glucose-Capillary: 102 mg/dL — ABNORMAL HIGH (ref 70–99)
Glucose-Capillary: 129 mg/dL — ABNORMAL HIGH (ref 70–99)
Glucose-Capillary: 92 mg/dL (ref 70–99)
Glucose-Capillary: 94 mg/dL (ref 70–99)
Glucose-Capillary: 97 mg/dL (ref 70–99)

## 2022-07-16 LAB — PREPARE FRESH FROZEN PLASMA
Unit division: 0
Unit division: 0

## 2022-07-16 LAB — CBC
HCT: 28.9 % — ABNORMAL LOW (ref 36.0–46.0)
Hemoglobin: 9.1 g/dL — ABNORMAL LOW (ref 12.0–15.0)
MCH: 32.3 pg (ref 26.0–34.0)
MCHC: 31.5 g/dL (ref 30.0–36.0)
MCV: 102.5 fL — ABNORMAL HIGH (ref 80.0–100.0)
Platelets: 132 10*3/uL — ABNORMAL LOW (ref 150–400)
RBC: 2.82 MIL/uL — ABNORMAL LOW (ref 3.87–5.11)
RDW: 23.6 % — ABNORMAL HIGH (ref 11.5–15.5)
WBC: 10.7 10*3/uL — ABNORMAL HIGH (ref 4.0–10.5)
nRBC: 0.4 % — ABNORMAL HIGH (ref 0.0–0.2)

## 2022-07-16 LAB — MAGNESIUM: Magnesium: 1.7 mg/dL (ref 1.7–2.4)

## 2022-07-16 LAB — BLOOD GAS, ARTERIAL
Acid-base deficit: 1.3 mmol/L (ref 0.0–2.0)
Bicarbonate: 23.7 mmol/L (ref 20.0–28.0)
O2 Saturation: 100 %
Patient temperature: 36.6
pCO2 arterial: 39 mmHg (ref 32–48)
pH, Arterial: 7.39 (ref 7.35–7.45)
pO2, Arterial: 176 mmHg — ABNORMAL HIGH (ref 83–108)

## 2022-07-16 MED ORDER — ALBUMIN HUMAN 25 % IV SOLN
50.0000 g | Freq: Four times a day (QID) | INTRAVENOUS | Status: AC
  Administered 2022-07-16 (×3): 50 g via INTRAVENOUS
  Filled 2022-07-16 (×3): qty 200

## 2022-07-16 MED ORDER — BOOST / RESOURCE BREEZE PO LIQD CUSTOM
1.0000 | Freq: Three times a day (TID) | ORAL | Status: DC
  Administered 2022-07-16 – 2022-07-21 (×13): 1 via ORAL

## 2022-07-16 MED ORDER — POTASSIUM CHLORIDE 10 MEQ/50ML IV SOLN
10.0000 meq | INTRAVENOUS | Status: AC
  Administered 2022-07-16 (×4): 10 meq via INTRAVENOUS
  Filled 2022-07-16 (×4): qty 50

## 2022-07-16 MED ORDER — DOCUSATE SODIUM 100 MG PO CAPS: 100.0000 mg | ORAL_CAPSULE | Freq: Two times a day (BID) | ORAL | Status: DC

## 2022-07-16 MED ORDER — ORAL CARE MOUTH RINSE
15.0000 mL | OROMUCOSAL | Status: DC | PRN
  Administered 2022-07-16 (×2): 15 mL via OROMUCOSAL

## 2022-07-16 MED ORDER — POLYETHYLENE GLYCOL 3350 17 G PO PACK: 17.0000 g | PACK | Freq: Every day | ORAL | Status: DC

## 2022-07-16 MED ORDER — MIDODRINE HCL 5 MG PO TABS
10.0000 mg | ORAL_TABLET | Freq: Three times a day (TID) | ORAL | Status: DC
  Administered 2022-07-16 – 2022-07-21 (×15): 10 mg via ORAL
  Filled 2022-07-16 (×16): qty 2

## 2022-07-16 MED ORDER — MAGNESIUM SULFATE 2 GM/50ML IV SOLN
2.0000 g | Freq: Once | INTRAVENOUS | Status: AC
  Administered 2022-07-16: 2 g via INTRAVENOUS
  Filled 2022-07-16: qty 50

## 2022-07-16 NOTE — Progress Notes (Signed)
Called to pt bedside by RN due to pt self-extubation.  Upon arrival, pt was being bagged by charge RN, pt spontaneously breathing, no stridor noted.  Pt changed to NRB then 6L salter/hfnc.  HR65, RR12, spo2 100%.

## 2022-07-16 NOTE — Consult Note (Signed)
Consultation Note Date: 07/16/2022   Patient Name: Hannah Blake  DOB: 05/08/1951  MRN: [id number]  Age / Sex: 71 y.o., female  PCP: Rocky Morel, DO Referring Physician: Josephine Igo, DO  Reason for Consultation: Establishing goals of care  HPI/Patient Profile: 70 y.o. female  admitted on 07/10/2022  Clinical Assessment and Goals of Care: 71 year old lady, known to palliative medicine service, seen in a previous hospitalization, admitted recently for hematemesis, patient required several units of packed red blood cell transfusions.  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  Patient was also recently hospitalized, patient admitted with dizziness while having bowel movement and found to have GI bleed, was with low blood pressures, admitted to ICU with GI specialist following.  Palliative care consulted for CODE STATUS and goals of care discussions. Previous palliative consultation note reviewed, current palliative consult request received, chart reviewed, patient seen and examined-call placed and discussed with the grandson [name].   Palliative medicine is specialized medical care for people living with serious illness. It focuses on providing relief from the symptoms and stress of a serious illness. The goal is to improve quality of life for both the patient and the family. Goals of care: Broad aims of medical therapy in relation to the patient's values and preferences. Our aim is to provide medical care aimed at enabling patients to achieve the goals that matter most to them, given the circumstances of their particular medical situation and their constraints.    HCPOA  [place].   SUMMARY OF RECOMMENDATIONS   Call placed and discussed with grandson Petra Kuba the patient's designated healthcare power of  attorney agent, advance care planning documents noted on the chart listing patient's grandson as her HCPOA agent.  Goals remain for full code full scope care.  Family is aware that the patient self extubated, discussed with grandson on the phone to the best of my ability about patient's current condition.  Desires full code full scope for now is asking about GI consult and recommendations as well as whether or not repeat endoscopy is being planned.  PMT to follow.  Thank you for the consult.  Code Status/Advance Care Planning: Full code   Symptom Management:     Palliative Prophylaxis:  Bowel Regimen  Additional Recommendations (Limitations, Scope, Preferences): Full Scope Treatment  Psycho-social/Spiritual:  Desire for further Chaplaincy support:yes Additional Recommendations: Caregiving  Support/Resources  Prognosis:  Unable to determine  Discharge Planning: To Be Determined      Primary Diagnoses: Present on Admission:  Hemorrhagic shock (HCC)   I have reviewed the medical record, interviewed the patient and family, and examined the patient. The following aspects are pertinent.  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[facility])    Social History   Socioeconomic History  Marital status: Single    Spouse name: Not on file   Number of children: Not on file   Years of education: Not on file   Highest education level: Not on file  Occupational History   Not on file  Tobacco Use   Smoking status: Every Day    Packs/day: 1.00    Years: 40.00    Total pack years: 40.00    Types: Cigarettes   Smokeless tobacco: Never  Vaping Use   Vaping Use: Never used  Substance and Sexual Activity   Alcohol use: Not Currently    Comment: weekly   Drug use: Never   Sexual activity: Not on file  Other Topics Concern   Not on file   Social History Narrative   Not on file   Social Determinants of Health   Financial Resource Strain: Not on file  Food Insecurity: No Food Insecurity (07/11/2022)   Hunger Vital Sign    Worried About Running Out of Food in the Last Year: Never true    Ran Out of Food in the Last Year: Never true  Transportation Needs: No Transportation Needs (07/11/2022)   PRAPARE - Administrator, Civil Service (Medical): No    Lack of Transportation (Non-Medical): No  Physical Activity: Not on file  Stress: Not on file  Social Connections: Not on file   Family History  Family history unknown: Yes   Scheduled Meds:  Chlorhexidine Gluconate Cloth  6 each Topical Daily   feeding supplement  1 Container Oral TID BM   midodrine  10 mg Oral TID WC   pantoprazole (PROTONIX) IV  40 mg Intravenous Q12H   Continuous Infusions:  sodium chloride 250 mL (07/15/22 1943)   albumin human 50 g (07/16/22 1128)   cefTRIAXone (ROCEPHIN)  IV Stopped (07/15/22 1903)   norepinephrine (LEVOPHED) Adult infusion 3 mcg/min (07/16/22 0917)   PRN Meds:.docusate sodium, mouth rinse, polyethylene glycol Medications Prior to Admission:  Prior to Admission medications   Medication Sig Start Date End Date Taking? Authorizing Provider  furosemide (LASIX) 20 MG tablet Take 1 tablet (20 mg total) by mouth every Monday, Wednesday, and Friday. 07/09/22 08/08/22 Yes Albertine Grates, MD  pantoprazole (PROTONIX) 40 MG tablet Take 1 tablet (40 mg total) by mouth 2 (two) times daily before a meal. 07/10/22  Yes Modena Slater, DO  spironolactone (ALDACTONE) 50 MG tablet Take 1 tablet (50 mg total) by mouth every Monday, Wednesday, and Friday. 07/09/22 08/08/22 Yes Albertine Grates, MD  ursodiol (ACTIGALL) 300 MG capsule Take 2 capsules (600 mg total) by mouth 2 (two) times daily. 07/10/22  Yes Modena Slater, DO   No Known Allergies Review of Systems Denies pain  Physical Exam Elderly, chronic appearing lady resting in bed and Awake  alert Monitor noted Diminished breath sounds S1-S2 Does not have edema Abdomen is not distended  Vital Signs: BP (!) 92/46   Pulse 92   Temp 98.1 F (36.7 C)   Resp (!) 21   Ht 5' (1.524 m)   Wt 47.1 kg   SpO2 96%   BMI 20.28 kg/m  Pain Scale: CPOT POSS *See Group Information*: S-Acceptable,Sleep, easy to arouse Pain Score: 0-No pain   SpO2: SpO2: 96 % O2 Device:SpO2: 96 % O2 Flow Rate: .O2 Flow Rate (L/min): 4 L/min  IO: Intake/output summary:  Intake/Output Summary (Last 24 hours) at 07/16/2022 1244 Last data filed at 07/16/2022 1049 Gross per 24 hour  Intake 918.8 ml  Output 2050 ml  Net -1131.2 ml    LBM: Last BM Date : 07/13/22 Baseline Weight: Weight: 45.6 kg Most recent weight: Weight: 47.1 kg     Palliative Assessment/Data:   PPS 60%  Time In:  11.30 Time Out:  12.30 Time Total:  60  Greater than 50%  of this time was spent counseling and coordinating care related to the above assessment and plan.  Signed by: Rosalin Hawking, MD   Please contact Palliative Medicine Team phone at [phone] for questions and concerns.  For individual provider: See Loretha Stapler

## 2022-07-16 NOTE — Progress Notes (Signed)
Wardell Honour 3:59 PM  Subjective: Patient seen and examined and discussed with the ICU team and she has been weaned off a lot of her drips and she extubated herself and she is tolerating clear liquids and does not have abdominal pain and she only complains of back pain and did recently have a dark bowel movement probably old blood no other complaints  Objective: Vital signs stable afebrile no acute distress abdomen is soft nontender BUN decreased creatinine decreased hemoglobin increased white count and platelet count okay  Assessment: Seemingly resolved upper GI bleeding  Plan: Will check on tomorrow but okay to hold repeat endoscopy for now and please call sooner if GI question or problem or signs of recurrent bleeding  Rashaun Wichert E  office [phone] After 5PM or if no answer call [phone]

## 2022-07-16 NOTE — Progress Notes (Signed)
eLink Physician-Brief Progress Note Patient Name: Hannah Blake DOB: December 28, 1950 MRN: [id number]   Date of Service  07/16/2022  HPI/Events of Note  Patient self-extubated, she is following commands and appears to be maintaining her airway and breathing effectively, she was only on Precedex at 0.8 mcg prior to self extubation.  eICU Interventions  Monitor patient closely post-extubation, stat ABG to assess  respiratory status, avoid sedatives.        Thomasene Lot Nikolette Reindl 07/16/2022, 1:34 AM

## 2022-07-16 NOTE — Progress Notes (Signed)
Nutrition Follow-up  DOCUMENTATION CODES:   Not applicable  INTERVENTION:  - Clear Liquid diet per MD. Advance as medically able. - Boost Breeze po TID, each supplement provides 250 kcal and 9 grams of protein  - Recommend daily multivitamin to support micronutrient needs.  - Monitor weight trends closely.   NUTRITION DIAGNOSIS:   Inadequate oral intake related to inability to eat, acute illness (GIB) as evidenced by NPO status. *Improving  GOAL:   Patient will meet greater than or equal to 90% of their needs *not being met, adding supplements  MONITOR:   Vent status, Labs, Weight trends, I & O's  REASON FOR ASSESSMENT:   Ventilator    ASSESSMENT:   71 y.o. woman with history of PBC and cirrhosis complicated by esophageal varices and multiple GI bleeds in the past with recent EGD with esophageal ulcers with stigmata of recent bleeding as well as scattered gastric ulcers during admission for acute blood loss anemia presents with hematochezia and hemorrhagic shock now on pressors and intubated.  12/21: admitted, s/p EGD 12/22: intubated 12/27: pt self extubated, starting clear liquids  Patient awake at time of visit, endorsed feeling poorly. Unsure of a UBW of recent changes in weight.   Per discussion with MD, plan to start clear liquids today. Likely GI will not want feeding tube due to ongoing GI bleed.  Will order Boost Breeze to support oral intake.   Patient has been NPO since admission 6 days ago. If diet unable to be be advanced past clear liquids within the next 1-2 days, may want to consider TPN.    Medications reviewed and include: Colace, Miralax  Labs reviewed:  -   Diet Order:   Diet Order             Diet clear liquid Room service appropriate? Yes; Fluid consistency: Thin  Diet effective now                   EDUCATION NEEDS:  Not appropriate for education at this time  Skin:  Skin Assessment: Reviewed RN Assessment  Last BM:   12/24  Height:  Ht Readings from Last 1 Encounters:  07/13/22 5' (1.524 m)   Weight:  Wt Readings from Last 1 Encounters:  07/16/22 47.1 kg    BMI:  Body mass index is 20.28 kg/m.  Estimated Nutritional Needs:  Kcal:  1500-1700 Protein:  75-90g Fluid:  1.7L/day    Samson Frederic RD, LDN For contact information, refer to [facility].

## 2022-07-16 NOTE — Progress Notes (Addendum)
NAME:  Hannah Blake, MRN:  [id number], DOB:  12/07/1950, LOS: 6 ADMISSION DATE:  07/10/2022, CONSULTATION DATE: 12/21 REFERRING MD: Dr. Rush Landmark, CHIEF COMPLAINT: Blood in stool   History of Present Illness:  71 y/o F who presented to [facility] ER with reports of bloody stools.   The patient was admitted from 12/16-12/20/23 for hematemesis.  She was admitted with shock in the setting of GIB.  She required 4 units PRBC.  GI consulted and EGD completed showed ulcerations in the esophagus, stomach and duodenum with clots noted throughout the stomach. No significant varices on EGD noted. GI was concerned for possible NSAID use PTA. She required vasopressors, PPI infusion.  Rec's at discharge were for PPI BID x3 months then indefinetly, ursodiol and close follow up. Per chart review, grandson went to pick up ursodiol and it was 550$, and was unable to pick up.  She returns on 12/21 with reports of getting dizzy while having a bowel movement. Family noted blood in toilet. EMS was activated.  Initial evaluation noted her to have a blood pressure of 60/30.  On arrival to ER, RA saturations 83% and was placed on a NRB. Initial labs notable for Hgb 4.9, WBC 34.2, platelets 197. Temp 95.1. Other labs pending.   PCCM called for ICU admission.   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  Intubated, EGD with blood/clot in stomach but unable to visualize area of bleeding. On pressors. 6 units PRBC, 1 pk platelets 12/22 CTA ABD/Pelvis with no findings suspicious for active GIB, distended stomach with suspected intraluminal hematoma, wall thickening of transverse, descending/rectosigmoid colon, moderate abdominopelvic ascites,  cirrhosis. 12/23: CT abdomen pelvis with posterior stomach bleed, large intraluminal hematoma, additional blood products given, more hypotensive, IR and GI discussed case, embolization performed 12/24 weaning pressors, propofol stopped 12/27 self extubated, on RA, doing well   Interim History / Subjective:   Self extubated overnight.  Objective   Blood pressure (!) 98/45, pulse 94, temperature 98.1 F (36.7 C), resp. rate 14, height 5' (1.524 m), weight 47.1 kg, SpO2 100 %.    Vent Mode: PRVC FiO2 (%):  [30 %] 30 % Set Rate:  [14 bmp] 14 bmp Vt Set:  [360 mL] 360 mL PEEP:  [5 cmH20] 5 cmH20 Plateau Pressure:  [12 cmH20-14 cmH20] 14 cmH20   Intake/Output Summary (Last 24 hours) at 07/16/2022 0806 Last data filed at 07/16/2022 7829 Gross per 24 hour  Intake 989.99 ml  Output 1500 ml  Net -510.01 ml   Filed Weights   07/14/22 0417 07/15/22 0449 07/16/22 0500  Weight: 52.3 kg 47.5 kg 47.1 kg    Examination: General: Chronically ill-appearing elderly female HEENT: NCAT, opens eyes tracks appropriately Neuro: Alert, follows commands CV: Regular rate rhythm, S1-S2 PULM: Diminished breath sounds bilaterally poor effort GI: Soft nontender nondistended Extremities: No significant Skin: No rash  Resolved Hospital Problem list      Assessment & Plan:   Hemorrhagic shock due to acute blood loss anemia from gastrointestinal source: Given report of blood at home.  Recent GI bleed.  Recent evidence of multiple ulcers in the esophagus and stomach.  Massive transfusion protocol 07/10/2022 with improvement in hemoglobin but ongoing hypotension.  First  endoscopy at bedside revealed a lot of blood in the stomach, no clear source.  Initial CTA angio of the abdomen pelvis was unrevealing, repeat AM 12/23 reveals difficult target for embo.  Underwent embolization 12/23 with improved hemodynamics. Plan: Coming off low-dose norepinephrine, continue midodrine 10 mg, 3 times daily Status post  10 units PRBCs and 1 unit of platelets, H&H stable Continue PPI twice daily Stop octreotide Albumin 50g X3 doses  Postprocedural ventilator management: Intubated for EGD.  Remains intubated with significant hemodynamic instability. -Self extubated overnight 07/16/2022 - Respiratory status stable.  PBC with cirrhosis: -- Ceftriaxone for SBP prophylaxis   GERD: PPI   Heart failure with preserved ejection fraction: -- Transfuse as needed - Holding diuresis at this time.    Best Practice (right click and "Reselect all SmartList Selections" daily)  Diet/type: clear liquids DVT prophylaxis: SCD GI prophylaxis: PPI Lines: No longer needed.  Order written to d/c  Foley:  removal ordered  Code Status:  full code Last date of multidisciplinary goals of care discussion: remains full code   This patient is critically ill with multiple organ system failure; which, requires frequent high complexity decision making, assessment, support, evaluation, and titration of therapies. This was completed through the application of advanced monitoring technologies and extensive interpretation of multiple databases. During this encounter critical care time was devoted to patient care services described in this note for 32 minutes.   Josephine Igo, DO [facility] 07/16/2022 8:07 AM

## 2022-07-16 NOTE — Progress Notes (Signed)
[facility] ADULT ICU REPLACEMENT PROTOCOL   The patient does apply for the [facility] Adult ICU Electrolyte Replacment Protocol based on the criteria listed below:   1.Exclusion criteria: TCTS, ECMO, Dialysis, and Myasthenia Gravis patients 2. Is GFR >/= 30 ml/min? Yes.    Patient's GFR today is >60 3. Is SCr </= 2? Yes.   Patient's SCr is 0.64 mg/dL 4. Did SCr increase >/= 0.5 in 24 hours? No. 5.Pt's weight >40kg  Yes.   6. Abnormal electrolyte(s): Mag 1.7, K+ 3.6  7. Electrolytes replaced per protocol 8.  Call MD STAT for K+ </= 2.5, Phos </= 1, or Mag </= 1 Physician:  Reeves Dam [facility] 07/16/2022 4:35 AM

## 2022-07-17 ENCOUNTER — Inpatient Hospital Stay (HOSPITAL_COMMUNITY): Payer: Medicare Other

## 2022-07-17 DIAGNOSIS — D649 Anemia, unspecified: Secondary | ICD-10-CM

## 2022-07-17 LAB — GLUCOSE, CAPILLARY: Glucose-Capillary: 114 mg/dL — ABNORMAL HIGH (ref 70–99)

## 2022-07-17 LAB — BASIC METABOLIC PANEL
Anion gap: 8 (ref 5–15)
BUN: 10 mg/dL (ref 8–23)
CO2: 25 mmol/L (ref 22–32)
Calcium: 8.8 mg/dL — ABNORMAL LOW (ref 8.9–10.3)
Chloride: 109 mmol/L (ref 98–111)
Creatinine, Ser: 0.57 mg/dL (ref 0.44–1.00)
GFR, Estimated: >60 mL/min (ref >60)
Glucose, Bld: 114 mg/dL — ABNORMAL HIGH (ref 70–99)
Potassium: 3.5 mmol/L (ref 3.5–5.1)
Sodium: 142 mmol/L (ref 135–145)

## 2022-07-17 LAB — CBC
HCT: 31.4 % — ABNORMAL LOW (ref 36.0–46.0)
Hemoglobin: 9.7 g/dL — ABNORMAL LOW (ref 12.0–15.0)
MCH: 31.6 pg (ref 26.0–34.0)
MCHC: 30.9 g/dL (ref 30.0–36.0)
MCV: 102.3 fL — ABNORMAL HIGH (ref 80.0–100.0)
Platelets: 68 10*3/uL — ABNORMAL LOW (ref 150–400)
RBC: 3.07 MIL/uL — ABNORMAL LOW (ref 3.87–5.11)
RDW: 24.9 % — ABNORMAL HIGH (ref 11.5–15.5)
WBC: 6.9 10*3/uL (ref 4.0–10.5)
nRBC: 0.3 % — ABNORMAL HIGH (ref 0.0–0.2)

## 2022-07-17 LAB — MAGNESIUM: Magnesium: 1.9 mg/dL (ref 1.7–2.4)

## 2022-07-17 MED ORDER — BENZONATATE 100 MG PO CAPS: 100.0000 mg | ORAL_CAPSULE | Freq: Three times a day (TID) | ORAL | Status: DC | PRN

## 2022-07-17 MED ORDER — LIDOCAINE 5 % EX PTCH
1.0000 | MEDICATED_PATCH | CUTANEOUS | Status: DC
  Administered 2022-07-17 – 2022-07-20 (×4): 1 via TRANSDERMAL
  Filled 2022-07-17 (×4): qty 1

## 2022-07-17 NOTE — Progress Notes (Signed)
  Progress Note   Patient: Hannah Blake YQM:[id number] DOB: 1951-01-25 DOA: 07/10/2022     7 DOS: the patient was seen and examined on 07/17/2022   Brief hospital course: 71yo presented with bloody stools and found to be in shock, requiring pressor support on ICU service. GI had been following, found to have ulcers in esophagus, stomach, duodenum. Now more stable off pressors, transferred to [facility]  Assessment and Plan: Hemorrhagic shock -Now off pressor support, hemodynamically stable -Hgb reviewed, stable -Repeat CBC in AM -GI following. If CBC stable, can advance diet   PBC with cirrhosis -Recheck lft in AM  GERD -Cont PPI as tolerated  Chronic diastolic CHF -Appears euvolemic      Subjective: Without complaints  Physical Exam: Vitals:   07/17/22 1300 07/17/22 1400 07/17/22 1500 07/17/22 1600  BP: 129/63 (!) 142/58 (!) 130/53 (!) 124/55  Pulse: 85 84 75 65  Resp: 17 (!) 22 16 17   Temp:    98.7 F (37.1 C)  TempSrc:    Oral  SpO2: 100% 97% 100% 100%  Weight:      Height:       General exam: Conversant, in no acute distress Respiratory system: normal chest rise, clear, no audible wheezing Cardiovascular system: regular rhythm, s1-s2 Gastrointestinal system: Nontender, pos BS Central nervous system: No seizures, no tremors Extremities: No cyanosis, no joint deformities Skin: No rashes, no pallor Psychiatry: Affect normal // no auditory hallucinations   Data Reviewed:  Labs reviewed: Na 142, K 3.5, Cr 0.57   Family Communication: Pt in room, family not at bedside  Disposition: Status is: Inpatient Remains inpatient appropriate because: Severity of illness  Planned Discharge Destination: Home    Author: , MD 07/17/2022 6:19 PM  For on call review [email or web address].

## 2022-07-17 NOTE — Hospital Course (Signed)
71yo presented with bloody stools and found to be in shock, requiring pressor support on ICU service. GI had been following, found to have ulcers in esophagus, stomach, duodenum. Now more stable off pressors, transferred to [facility]

## 2022-07-17 NOTE — Progress Notes (Signed)
Hannah Blake 5:14 PM  Subjective: Patient only complaining of back pain no signs of bleeding no bowel movement today not really eating much clear liquids no new complaints  Objective: Vital signs stable afebrile no acute distress abdomen is soft nontender normal BUN and creatinine hemoglobin actually increased  Assessment: Seemingly resolved upper GI bleeding  Plan: If labs in the a.m. look good and no signs of bleeding okay with me to slowly advance diet will check on tomorrow and consider a recheck INR with some vitamin K as needed  [facility] E  office [phone] After 5PM or if no answer call [phone]

## 2022-07-17 NOTE — Evaluation (Addendum)
Physical Therapy Evaluation Patient Details Name: Hannah Blake MRN: [id number] DOB: 1950/08/12 Today's Date: 07/17/2022  History of Present Illness  71 y/o F who presented to [facility] ER 12/21 with reports of bloody stools.  Intubated 12/21-12/27/23.     The patient was admitted from 12/16-12/20/23 for hematemesis.  She was admitted with shock in the setting of GIB.  PMH: Bell's Palsy, cirrhosis, CAD,  heart failure.  Clinical Impression  Pt admitted with above diagnosis.  Pt currently with functional limitations due to the deficits listed below (see PT Problem List). Pt will benefit from skilled PT to increase their independence and safety with mobility to allow discharge to the venue listed below.       The patient  appears very frail. Patient required max assistance to move to sitting then stood briefly x x 1 and  indicated that she wanted to return to  the bed.   Patient maintained on 1.5/2 LPM, SPO2 stable, > 90%, HR stable.  Continue PT and obtain more information about family support and prior  function.     Recommendations for follow up therapy are one component of a multi-disciplinary discharge planning process, led by the attending physician.  Recommendations may be updated based on patient status, additional functional criteria and insurance authorization.  Follow Up Recommendations Skilled nursing-short term rehab (<3 hours/day)vs home with 24/7/HHPT Can patient physically be transported by private vehicle: No    Assistance Recommended at Discharge Frequent or constant Supervision/Assistance  Patient can return home with the following  Two people to help with walking and/or transfers;A lot of help with bathing/dressing/bathroom;Assistance with cooking/housework;Direct supervision/assist for medications management;Assist for transportation;Help with stairs or ramp for entrance    Equipment Recommendations None recommended by PT  Recommendations for Other Services       Functional  Status Assessment Patient has had a recent decline in their functional status and demonstrates the ability to make significant improvements in function in a reasonable and predictable amount of time.     Precautions / Restrictions Precautions Precautions: Fall      Mobility  Bed Mobility Overal bed mobility: Needs Assistance Bed Mobility: Rolling, Sidelying to Sit, Sit to Sidelying Rolling: Mod assist Sidelying to sit: Max assist     Sit to sidelying: Max assist General bed mobility comments: multimodal cues to  reach for rail to roll, assisted legs and trunk to assume sitting position. Assist with legs and trunk back to side.    Transfers Overall transfer level: Needs assistance   Transfers: Sit to/from Stand Sit to Stand: Mod assist           General transfer comment: back of chair placed infront of patient, assisted tp stand x 1, holding ono back of chair, patient  did not take  side steps as instructed. patient appears very weak. reamined in sidelying    Ambulation/Gait                  Stairs            Wheelchair Mobility    Modified Rankin (Stroke Patients Only)       Balance Overall balance assessment: History of Falls, Needs assistance Sitting-balance support: Bilateral upper extremity supported, Feet supported Sitting balance-Leahy Scale: Fair     Standing balance support: Bilateral upper extremity supported, During functional activity, Reliant on assistive device for balance Standing balance-Leahy Scale: Poor  Pertinent Vitals/Pain Pain Assessment Pain Assessment: Faces Faces Pain Scale: No hurt    Home Living Family/patient expects to be discharged to:: Private residence Living Arrangements: Children Available Help at Discharge: Available 24 hours/day;Family Type of Home: House Home Access: Stairs to enter   [employer] of Steps: unsure       Additional Comments: unsure  of prior function and home situation/layout, no family present    Prior Function Prior Level of Function : History of Falls (last six months)             Mobility Comments: unsure, patient unable , indicates that she has a RW       Hand Dominance   Dominant Hand: Right    Extremity/Trunk Assessment   Upper Extremity Assessment Upper Extremity Assessment: Generalized weakness    Lower Extremity Assessment Lower Extremity Assessment: Generalized weakness    Cervical / Trunk Assessment Cervical / Trunk Assessment: Kyphotic  Communication   Communication: Prefers language other than English (understands and speaks a little [place])  Cognition Arousal/Alertness: Awake/alert Behavior During Therapy: Restless Overall Cognitive Status: No family/caregiver present to determine baseline cognitive functioning                                 General Comments: patient follows simple directions, answered simple questions about self and with whom she lives, indicates family.  No family present to gather info        General Comments      Exercises     Assessment/Plan    PT Assessment Patient needs continued PT services  PT Problem List Decreased strength;Decreased activity tolerance;Decreased mobility;Cardiopulmonary status limiting activity;Decreased knowledge of precautions;Decreased knowledge of use of DME;Decreased balance       PT Treatment Interventions DME instruction;Therapeutic activities;Balance training;Functional mobility training;Therapeutic exercise;Patient/family education    PT Goals (Current goals can be found in the Care Plan section)  Acute Rehab PT Goals PT Goal Formulation: Patient unable to participate in goal setting Time For Goal Achievement: 07/31/22 Potential to Achieve Goals: Fair    Frequency Min 2X/week     Co-evaluation               AM-PAC PT "6 Clicks" Mobility  Outcome Measure Help needed turning from your back  to your side while in a flat bed without using bedrails?: A Lot Help needed moving from lying on your back to sitting on the side of a flat bed without using bedrails?: Total Help needed moving to and from a bed to a chair (including a wheelchair)?: Total Help needed standing up from a chair using your arms (e.g., wheelchair or bedside chair)?: Total Help needed to walk in hospital room?: Total Help needed climbing 3-5 steps with a railing? : Total 6 Click Score: 7    End of Session Equipment Utilized During Treatment: Oxygen Activity Tolerance: Patient limited by fatigue Patient left: in bed;with call bell/phone within reach;with bed alarm set Nurse Communication: Mobility status PT Visit Diagnosis: Unsteadiness on feet (R26.81);Muscle weakness (generalized) (M62.81);Difficulty in walking, not elsewhere classified (R26.2);History of falling (Z91.81)    Time: 8588-5027 PT Time Calculation (min) (ACUTE ONLY): 18 min   Charges:   PT Evaluation $PT Eval Low Complexity: 1 Low           Blanchard Kelch PT Acute Rehabilitation Services Office [phone] Weekend pager-[phone]   Rada Hay 07/17/2022, 1:39 PM

## 2022-07-18 LAB — CBC
HCT: 29.7 % — ABNORMAL LOW (ref 36.0–46.0)
Hemoglobin: 9.1 g/dL — ABNORMAL LOW (ref 12.0–15.0)
MCH: 31.8 pg (ref 26.0–34.0)
MCHC: 30.6 g/dL (ref 30.0–36.0)
MCV: 103.8 fL — ABNORMAL HIGH (ref 80.0–100.0)
Platelets: 143 10*3/uL — ABNORMAL LOW (ref 150–400)
RBC: 2.86 MIL/uL — ABNORMAL LOW (ref 3.87–5.11)
RDW: 25 % — ABNORMAL HIGH (ref 11.5–15.5)
WBC: 8.7 10*3/uL (ref 4.0–10.5)
nRBC: 0 % (ref 0.0–0.2)

## 2022-07-18 LAB — COMPREHENSIVE METABOLIC PANEL
ALT: 48 U/L — ABNORMAL HIGH (ref 0–44)
AST: 58 U/L — ABNORMAL HIGH (ref 15–41)
Albumin: 2.8 g/dL — ABNORMAL LOW (ref 3.5–5.0)
Alkaline Phosphatase: 238 U/L — ABNORMAL HIGH (ref 38–126)
Anion gap: 4 — ABNORMAL LOW (ref 5–15)
BUN: 8 mg/dL (ref 8–23)
CO2: 25 mmol/L (ref 22–32)
Calcium: 8 mg/dL — ABNORMAL LOW (ref 8.9–10.3)
Chloride: 110 mmol/L (ref 98–111)
Creatinine, Ser: 0.48 mg/dL (ref 0.44–1.00)
GFR, Estimated: >60 mL/min (ref >60)
Glucose, Bld: 101 mg/dL — ABNORMAL HIGH (ref 70–99)
Potassium: 3.2 mmol/L — ABNORMAL LOW (ref 3.5–5.1)
Sodium: 139 mmol/L (ref 135–145)
Total Bilirubin: 1.5 mg/dL — ABNORMAL HIGH (ref 0.3–1.2)
Total Protein: 5.8 g/dL — ABNORMAL LOW (ref 6.5–8.1)

## 2022-07-18 MED ORDER — POTASSIUM CHLORIDE CRYS ER 20 MEQ PO TBCR
40.0000 meq | EXTENDED_RELEASE_TABLET | ORAL | Status: AC
  Administered 2022-07-18 (×2): 40 meq via ORAL
  Filled 2022-07-18 (×2): qty 2

## 2022-07-18 MED ORDER — PANTOPRAZOLE SODIUM 40 MG PO TBEC
40.0000 mg | DELAYED_RELEASE_TABLET | Freq: Two times a day (BID) | ORAL | Status: DC
  Administered 2022-07-18: 40 mg via ORAL
  Filled 2022-07-18: qty 1

## 2022-07-18 NOTE — NC FL2 (Cosign Needed)
[place] MEDICAID FL2 LEVEL OF CARE FORM     IDENTIFICATION  Patient Name: Hannah Blake Birthdate: 06/26/1951 Sex: female Admission Date (Current Location): 07/10/2022  [facility] and [place] Number:      Facility and Address:  [facility],  [address] [place], [place] [address]      Provider Number: [id number]  Attending Physician Name and Address:  Jerald Kief, MD  Relative Name and Phone Number:       Current Level of Care: Hospital Recommended Level of Care: Skilled Nursing Facility Prior Approval Number:    Date Approved/Denied:   PASRR Number: [id number]  Discharge Plan: SNF    Current Diagnoses: Patient Active Problem List   Diagnosis Date Noted   Hemorrhagic shock (HCC) 07/10/2022   Chronic systolic CHF (congestive heart failure) (HCC) 07/07/2022   Iron deficiency anemia due to chronic blood loss 07/07/2022   Acute upper GI bleeding 07/05/2022   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(HCC) 05/02/2022   Anemia of chronic disease 05/02/2022   GERD (gastroesophageal reflux disease) 05/02/2022   Esophageal varices in alcoholic cirrhosis (HCC) 01/15/2022   Multiple gastric ulcers 01/15/2022   Goals of care, counseling/discussion 01/15/2022   Heart failure with reduced ejection fraction (HCC) 01/15/2022   Hepatic cirrhosis (HCC) 01/13/2022   Protein-calorie malnutrition, severe 05/21/2021    Orientation RESPIRATION BLADDER Height & Weight     Self, Time, Situation, Place  Normal Continent Weight: 49.7 kg Height:  5' (152.4 cm)  BEHAVIORAL SYMPTOMS/MOOD NEUROLOGICAL BOWEL NUTRITION STATUS      Continent Diet (regular)  AMBULATORY STATUS COMMUNICATION OF NEEDS Skin   Extensive Assist Verbally Normal                       Personal Care Assistance Level of Assistance  Bathing, Feeding, Dressing Bathing Assistance: Limited assistance Feeding assistance: Limited assistance Dressing Assistance: Limited assistance     Functional Limitations Info   Sight, Speech, Hearing Sight Info: Adequate Hearing Info: Adequate Speech Info: Adequate    SPECIAL CARE FACTORS FREQUENCY  PT (By licensed PT), OT (By licensed OT)     PT Frequency: 5 x weekly OT Frequency: 5 x weekly            Contractures Contractures Info: Not present    Additional Factors Info  Code Status Code Status Info: full             Current Medications (07/18/2022):  This is the current hospital active medication list Current Facility-Administered Medications  Medication Dose Route Frequency Provider Last Rate Last Admin   0.9 %  sodium chloride infusion  250 mL Intravenous Continuous Jerald Kief, MD 10 mL/hr at 07/15/22 1943 250 mL at 07/15/22 1943   benzonatate (TESSALON) capsule 100 mg  100 mg Oral TID PRN Jerald Kief, MD       cefTRIAXone (ROCEPHIN) 2 g in sodium chloride 0.9 % 100 mL IVPB  2 g Intravenous q1800 Jerald Kief, MD   Stopped at 07/17/22 2020   Chlorhexidine Gluconate Cloth 2 % PADS 6 each  6 each Topical Daily Jerald Kief, MD   6 each at 07/17/22 2020   docusate sodium (COLACE) capsule 100 mg  100 mg Oral BID PRN Jerald Kief, MD       feeding supplement (BOOST / RESOURCE BREEZE) liquid 1 Container  1 Container Oral TID BM Jerald Kief, MD   1 Container at 07/18/22 0934   lidocaine (LIDODERM) 5 %  1 patch  1 patch Transdermal Q24H Jerald Kief, MD   1 patch at 07/17/22 2020   midodrine (PROAMATINE) tablet 10 mg  10 mg Oral TID WC Jerald Kief, MD   10 mg at 07/18/22 0933   Oral care mouth rinse  15 mL Mouth Rinse PRN Jerald Kief, MD   15 mL at 07/16/22 0703   pantoprazole (PROTONIX) injection 40 mg  40 mg Intravenous Q12H Jerald Kief, MD   40 mg at 07/18/22 0934   polyethylene glycol (MIRALAX / GLYCOLAX) packet 17 g  17 g Oral Daily PRN Jerald Kief, MD       potassium chloride SA (KLOR-CON M) CR tablet 40 mEq  40 mEq Oral Q4H Jerald Kief, MD   40 mEq at 07/18/22 3010     Discharge  Medications: Please see discharge summary for a list of discharge medications.  Relevant Imaging Results:  Relevant Lab Results:   Additional Information SSN: [id number]  Golda Acre, RN

## 2022-07-18 NOTE — Evaluation (Signed)
Occupational Therapy Evaluation Patient Details Name: Hannah Blake MRN: [id number] DOB: 1951-01-07 Today's Date: 07/18/2022   History of Present Illness 71 y/o F who presented to [facility] ER 12/21 with reports of bloody stools.  Intubated 12/21-12/27/23.     The patient was admitted from 12/16-12/20/23 for hematemesis.  She was admitted with shock in the setting of GIB.  PMH: Bell's Palsy, cirrhosis, CAD,  heart failure.   Clinical Impression   Mrs. Blake back is a 71 year old woman who presents with decreased activity tolerance and generalized weakness. She has poor vision and poor balance at baseline. She lives in a 2nd floor apartment with her daughter and two grandsons - though do not think she has 24/7 assistance as she reports her daughter works. Some portion of her history is taken from a prior admission in 2022. Patient min assist to transfer to edge of bed and min assist to ambulate short distance with walker - she needs assistance to guide the walker due to low vision. She needs assistance for ADLs. Due not having any idea what family could provide and current functional deficits recommend short term rehab.      Recommendations for follow up therapy are one component of a multi-disciplinary discharge planning process, led by the attending physician.  Recommendations may be updated based on patient status, additional functional criteria and insurance authorization.   Follow Up Recommendations  Skilled nursing-short term rehab (<3 hours/day)     Assistance Recommended at Discharge Frequent or constant Supervision/Assistance  Patient can return home with the following A little help with walking and/or transfers;A lot of help with bathing/dressing/bathroom;Assistance with cooking/housework;Direct supervision/assist for financial management;Assist for transportation;Help with stairs or ramp for entrance;Direct supervision/assist for medications management    Functional Status Assessment  Patient  has had a recent decline in their functional status and demonstrates the ability to make significant improvements in function in a reasonable and predictable amount of time.  Equipment Recommendations  None recommended by OT    Recommendations for Other Services       Precautions / Restrictions Precautions Precautions: Fall Restrictions Weight Bearing Restrictions: No      Mobility Bed Mobility Overal bed mobility: Needs Assistance Bed Mobility: Supine to Sit     Supine to sit: Min assist, HOB elevated     General bed mobility comments: hand hold to transfer to edge of bed    Transfers Overall transfer level: Needs assistance Equipment used: Rolling walker (2 wheels) Transfers: Sit to/from Stand Sit to Stand: Min assist           General transfer comment: Min assist to ambulate approx 10 feet with walker to recliner. Patient complaining of being could. Needed min assist for walker management - patient visually impaired.      Balance Overall balance assessment: History of Falls Sitting-balance support: No upper extremity supported, Feet supported Sitting balance-Leahy Scale: Fair     Standing balance support: During functional activity, Reliant on assistive device for balance Standing balance-Leahy Scale: Poor                             ADL either performed or assessed with clinical judgement   ADL Overall ADL's : Needs assistance/impaired Eating/Feeding: Set up   Grooming: Set up   Upper Body Bathing: Set up;Sitting   Lower Body Bathing: Moderate assistance;Sit to/from stand   Upper Body Dressing : Moderate assistance;Sitting   Lower Body Dressing: Moderate  assistance;Sit to/from stand   Toilet Transfer: Minimal assistance;Regular Toilet;Rolling walker (2 wheels);+2 for safety/equipment   Toileting- Clothing Manipulation and Hygiene: Sit to/from stand;Moderate assistance       Functional mobility during ADLs: Minimal  assistance;Rolling walker (2 wheels)       Vision   Additional Comments: Blind in left eye, 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     Perception     Praxis      Pertinent Vitals/Pain Pain Assessment Pain Assessment: No/denies pain     Hand Dominance Right   Extremity/Trunk Assessment Upper Extremity Assessment Upper Extremity Assessment: Generalized weakness   Lower Extremity Assessment Lower Extremity Assessment: Generalized weakness   Cervical / Trunk Assessment Cervical / Trunk Assessment: Normal   Communication     Cognition Arousal/Alertness: Awake/alert Behavior During Therapy: WFL for tasks assessed/performed Overall Cognitive Status: Within Functional Limits for tasks assessed                                 General Comments: Able to follow instructions, make jokes, answer questions     General Comments       Exercises     Shoulder Instructions      Home Living Family/patient expects to be discharged to:: Private residence Living Arrangements: Children (daughter, grandchildren -- do not think it is 24/7 assistance) Available Help at Discharge: Available PRN/intermittently Type of Home: Apartment Home Access: Stairs to enter Entrance Stairs-Number of Steps: flight - 2nd floor apartment Entrance Stairs-Rails: Right Home Layout: One level     Bathroom Shower/Tub: Chief Strategy Officer: Standard     Home Equipment: Agricultural consultant (2 wheels)   Additional Comments: Reports she has a walker but unsure if she uses it. From prior evaluation:  Adult Grandson reports that pt has fallen regularly since he was young stating that pt can be "clumsy".  Pt also with significantly impaired vision which may contribute.  Pt furniture cruises at baseline.  Pt does not like beds and sleeps on the floor. . Since that time, pt crawls to couch to pull up on for assistance.  ADLs Comments: Family assists with cooking/cleaning/laundry as needed and with  transportation as pt does not drive. Pt performs her own BADLs with family supervision in home.      Prior Functioning/Environment Prior Level of Function : History of Falls (last six months)               ADLs Comments: From prior evaluation 11/22: Family assists with cooking/cleaning/laundry as needed and with transportation as pt does not drive. Pt performs her own BADLs with family supervision in home.        OT Problem List: Decreased strength;Impaired balance (sitting and/or standing);Decreased activity tolerance;Decreased knowledge of use of DME or AE;Impaired vision/perception      OT Treatment/Interventions: Self-care/ADL training;Therapeutic exercise;DME and/or AE instruction;Therapeutic activities;Balance training;Patient/family education    OT Goals(Current goals can be found in the care plan section) Acute Rehab OT Goals Patient Stated Goal: did not state OT Goal Formulation: With patient Time For Goal Achievement: 08/01/22 Potential to Achieve Goals: Fair  OT Frequency: Min 2X/week    Co-evaluation              AM-PAC OT "6 Clicks" Daily Activity     Outcome Measure Help from another person eating meals?: A Little Help from another person taking care of personal grooming?: A Little Help from another person toileting,  which includes using toliet, bedpan, or urinal?: A Lot Help from another person bathing (including washing, rinsing, drying)?: A Lot Help from another person to put on and taking off regular upper body clothing?: A Lot Help from another person to put on and taking off regular lower body clothing?: A Lot 6 Click Score: 14   End of Session Equipment Utilized During Treatment: Rolling walker (2 wheels);Gait belt Nurse Communication: Mobility status  Activity Tolerance: Patient tolerated treatment well Patient left: in chair;with call bell/phone within reach;with chair alarm set  OT Visit Diagnosis: Muscle weakness (generalized)  (M62.81);Unsteadiness on feet (R26.81)                Time: 4401-0272 OT Time Calculation (min): 16 min Charges:  OT General Charges $OT Visit: 1 Visit OT Evaluation $OT Eval Low Complexity: 1 Low  Donnella Sham, OTR/L Acute Care Rehab Services  Office [phone]   Kelli Churn 07/18/2022, 3:41 PM

## 2022-07-18 NOTE — Progress Notes (Signed)
  Progress Note   Patient: Hannah Blake ASN:[id number] DOB: 1950-09-28 DOA: 07/10/2022     8 DOS: the patient was seen and examined on 07/18/2022   Brief hospital course: 71yo presented with bloody stools and found to be in shock, requiring pressor support on ICU service. GI had been following, found to have ulcers in esophagus, stomach, duodenum. Now more stable off pressors, transferred to [facility]  Assessment and Plan: Hemorrhagic shock -Now off pressor support, hemodynamically stable -Hgb remains stable this AM -dark stool noted overnight per RN -possible consideration for EGD vs conservative management. Will f/u with GI -Cont on protonix  PBC with cirrhosis -Recheck lft in AM -LFT's improved this AM  GERD -Cont PPI as tolerated  Chronic diastolic CHF -Appears euvolemic  Hypokalemia -Will replace -recheck lytes in AM      Subjective: Complains of mild mid-lower abd discomfort  Physical Exam: Vitals:   07/18/22 1045 07/18/22 1200 07/18/22 1252 07/18/22 1400  BP:  (!) 118/57  121/62  Pulse:  81  77  Resp:  20  19  Temp: 97.8 F (36.6 C)  (!) 97.5 F (36.4 C)   TempSrc: Oral  Oral   SpO2:  97%  97%  Weight:      Height:       General exam: Awake, laying in bed, in nad Respiratory system: Normal respiratory effort, no wheezing Cardiovascular system: regular rate, s1, s2 Gastrointestinal system: Soft, positive BS Central nervous system: CN2-12 grossly intact, strength intact Extremities: Perfused, no clubbing Skin: Normal skin turgor, no notable skin lesions seen Psychiatry: Mood normal // no visual hallucinations   Data Reviewed:  Labs reviewed: Na 139, K 3.2, Cr 0.48  Family Communication: Pt in room, family not at bedside  Disposition: Status is: Inpatient Remains inpatient appropriate because: Severity of illness  Planned Discharge Destination: Home    Author: Rickey Barbara, MD 07/18/2022 2:25 PM  For on call review [email or web address].

## 2022-07-18 NOTE — TOC Progression Note (Signed)
Transition of Care [facility]) - Progression Note    Patient Details  Name: Hannah Blake MRN: [id number] Date of Birth: 1950/12/01  [facility]) CM/SW Contact  Golda Acre, RN Phone Number: 07/18/2022, 9:46 AM  Clinical Narrative:    [facility] number [id number] A and fl2 sent out to area snf's for review.     Barriers to Discharge: Continued Medical Work up  Expected Discharge Plan and Services In-house Referral: NA Discharge Planning Services: CM Consult   Living arrangements for the past 2 months: Single Family Home                 DME Arranged: N/A DME Agency: NA       HH Arranged: NA HH Agency: NA         Social Determinants of Health (SDOH) Interventions SDOH Screenings   Food Insecurity: No Food Insecurity (07/11/2022)  Housing: Low Risk  (07/10/2022)  Transportation Needs: No Transportation Needs (07/11/2022)  Utilities: Not At Risk (07/11/2022)  Depression (PHQ2-9): Low Risk  (05/27/2021)  Tobacco Use: High Risk (07/15/2022)    Readmission Risk Interventions   No data to display

## 2022-07-18 NOTE — Progress Notes (Signed)
[facility] Gastroenterology Progress Note  Hannah Blake 71 y.o. [birth year]  CC:  GI bleed   Subjective: Patient examined at bedside. Alert and awake. Complains of back pain. Per nursing staff, two black stools overnight.  ROS : Review of Systems  Unable to perform ROS: Dementia      Objective: Vital signs in last 24 hours: Vitals:   07/18/22 0800 07/18/22 1000  BP: (!) 132/56 (!) 124/59  Pulse: 73 83  Resp: 17 16  Temp:    SpO2: 96% 96%    Physical Exam:  General:  Alert, cooperative, no distress, appears older than stated age  Head:  Normocephalic, without obvious abnormality, atraumatic  Eyes:  Anicteric sclera, EOM's intact  Lungs:   Clear to auscultation bilaterally, respirations unlabored  Heart:  Regular rate and rhythm, S1, S2 normal  Abdomen:   Soft, non-tender, bowel sounds active all four quadrants,  no masses,     Lab Results: Recent Labs    07/16/22 0150 07/17/22 1016 07/18/22 0329  NA 143 142 139  K 3.6 3.5 3.2*  CL 115* 109 110  CO2 22 25 25   GLUCOSE 108* 114* 101*  BUN 18 10 8   CREATININE 0.64 0.57 0.48  CALCIUM 8.1* 8.8* 8.0*  MG 1.7 1.9  --    Recent Labs    07/18/22 0329  AST 58*  ALT 48*  ALKPHOS 238*  BILITOT 1.5*  PROT 5.8*  ALBUMIN 2.8*   Recent Labs    07/17/22 1016 07/18/22 0329  WBC 6.9 8.7  HGB 9.7* 9.1*  HCT 31.4* 29.7*  MCV 102.3* 103.8*  PLT 68* 143*   No results for input(s): "LABPROT", "INR" in the last 72 hours.    Assessment Hematemesis             - hgb 9.1 -EGD 07/06/2022 with findings of few linear esophageal ulcers with stigmata of recent bleeding, nonbleeding superficial gastric ulcer 15 mm dimension, 2 nonbleeding superficial gastric ulcers 8 mm dimension, few nonbleeding superficial duodenal ulcers no stigmata of bleeding             -EGD 07/11/2022 with findings of red blood in upper third of esophagus, clotted blood in entire examined stomach, red blood in entire duodenum, mucosal visualization limited,  no obvious esophageal varices noted             -CT angiogram 07/12/2022 with findings of small focus of active intraluminal extravasation from posterior wall of stomach             -S/p interventional radiology angiogram 07/12/2022 with embolization of bleeding vessel (branch of left gastric artery) Normocytic, acute blood loss anemia, stable Hypovolemic shock, improved not resolved Primary biliary cirrhosis   Plan: Hgb stable. Two black stools overnight. No stools today Continue protonix 40mg  BID Continue daily CBC and transfuse as needed to maintain HGB > 7  Will discuss with Dr.Magod if plans for EGD. Though likely will continue conservative management.  07/14/2022 PA-C 07/18/2022, 10:43 AM  Contact #  [phone]

## 2022-07-19 ENCOUNTER — Encounter (HOSPITAL_COMMUNITY)
Admission: EM | Disposition: A | Discharge status: Home Health | Payer: Self-pay | Source: Home / Self Care | Attending: Pulmonary Disease

## 2022-07-19 ENCOUNTER — Encounter (HOSPITAL_COMMUNITY): Payer: Self-pay | Admitting: Pulmonary Disease

## 2022-07-19 ENCOUNTER — Inpatient Hospital Stay (HOSPITAL_COMMUNITY): Payer: Medicare Other | Admitting: Anesthesiology

## 2022-07-19 DIAGNOSIS — K3189 Other diseases of stomach and duodenum: Secondary | ICD-10-CM

## 2022-07-19 DIAGNOSIS — K259 Gastric ulcer, unspecified as acute or chronic, without hemorrhage or perforation: Secondary | ICD-10-CM

## 2022-07-19 DIAGNOSIS — I2510 Atherosclerotic heart disease of native coronary artery without angina pectoris: Secondary | ICD-10-CM

## 2022-07-19 DIAGNOSIS — I509 Heart failure, unspecified: Secondary | ICD-10-CM

## 2022-07-19 DIAGNOSIS — I8511 Secondary esophageal varices with bleeding: Secondary | ICD-10-CM

## 2022-07-19 DIAGNOSIS — F17210 Nicotine dependence, cigarettes, uncomplicated: Secondary | ICD-10-CM

## 2022-07-19 DIAGNOSIS — D62 Acute posthemorrhagic anemia: Secondary | ICD-10-CM

## 2022-07-19 DIAGNOSIS — K766 Portal hypertension: Secondary | ICD-10-CM

## 2022-07-19 HISTORY — PX: ESOPHAGOGASTRODUODENOSCOPY (EGD) WITH PROPOFOL: SHX5813

## 2022-07-19 LAB — COMPREHENSIVE METABOLIC PANEL
ALT: 44 U/L (ref 0–44)
AST: 54 U/L — ABNORMAL HIGH (ref 15–41)
Albumin: 3.1 g/dL — ABNORMAL LOW (ref 3.5–5.0)
Alkaline Phosphatase: 272 U/L — ABNORMAL HIGH (ref 38–126)
Anion gap: 4 — ABNORMAL LOW (ref 5–15)
BUN: 7 mg/dL — ABNORMAL LOW (ref 8–23)
CO2: 26 mmol/L (ref 22–32)
Calcium: 8.2 mg/dL — ABNORMAL LOW (ref 8.9–10.3)
Chloride: 109 mmol/L (ref 98–111)
Creatinine, Ser: 0.41 mg/dL — ABNORMAL LOW (ref 0.44–1.00)
GFR, Estimated: >60 mL/min (ref >60)
Glucose, Bld: 96 mg/dL (ref 70–99)
Potassium: 3.7 mmol/L (ref 3.5–5.1)
Sodium: 139 mmol/L (ref 135–145)
Total Bilirubin: 1.8 mg/dL — ABNORMAL HIGH (ref 0.3–1.2)
Total Protein: 5.9 g/dL — ABNORMAL LOW (ref 6.5–8.1)

## 2022-07-19 LAB — CBC
HCT: 29.5 % — ABNORMAL LOW (ref 36.0–46.0)
Hemoglobin: 9.2 g/dL — ABNORMAL LOW (ref 12.0–15.0)
MCH: 31.6 pg (ref 26.0–34.0)
MCHC: 31.2 g/dL (ref 30.0–36.0)
MCV: 101.4 fL — ABNORMAL HIGH (ref 80.0–100.0)
Platelets: 150 10*3/uL (ref 150–400)
RBC: 2.91 MIL/uL — ABNORMAL LOW (ref 3.87–5.11)
RDW: 24 % — ABNORMAL HIGH (ref 11.5–15.5)
WBC: 6.7 10*3/uL (ref 4.0–10.5)
nRBC: 0 % (ref 0.0–0.2)

## 2022-07-19 SURGERY — ESOPHAGOGASTRODUODENOSCOPY (EGD) WITH PROPOFOL
Anesthesia: Monitor Anesthesia Care

## 2022-07-19 MED ORDER — DICYCLOMINE HCL 10 MG PO CAPS
10.0000 mg | ORAL_CAPSULE | Freq: Once | ORAL | Status: AC
  Administered 2022-07-19: 10 mg via ORAL
  Filled 2022-07-19: qty 1

## 2022-07-19 MED ORDER — SODIUM CHLORIDE 0.9 % IV SOLN: INTRAVENOUS | Status: DC

## 2022-07-19 MED ORDER — PANTOPRAZOLE SODIUM 40 MG IV SOLR
40.0000 mg | Freq: Two times a day (BID) | INTRAVENOUS | Status: DC
  Administered 2022-07-19 – 2022-07-21 (×5): 40 mg via INTRAVENOUS
  Filled 2022-07-19 (×5): qty 10

## 2022-07-19 MED ORDER — SIMETHICONE 80 MG PO CHEW: 80.0000 mg | CHEWABLE_TABLET | Freq: Four times a day (QID) | ORAL | Status: DC | PRN

## 2022-07-19 MED ORDER — LACTATED RINGERS IV SOLN: INTRAVENOUS | Status: DC | PRN

## 2022-07-19 MED ORDER — OXYCODONE HCL 5 MG PO TABS: 2.5000 mg | ORAL_TABLET | ORAL | Status: DC | PRN

## 2022-07-19 MED ORDER — OXYCODONE HCL 5 MG PO TABS: 2.5000 mg | ORAL_TABLET | Freq: Once | ORAL | Status: DC

## 2022-07-19 MED ORDER — PROPOFOL 500 MG/50ML IV EMUL
INTRAVENOUS | Status: DC | PRN
  Administered 2022-07-19: 40 mg via INTRAVENOUS
  Administered 2022-07-19: 100 ug/kg/min via INTRAVENOUS

## 2022-07-19 SURGICAL SUPPLY — 15 items

## 2022-07-19 NOTE — Progress Notes (Addendum)
  Progress Note   Patient: Hannah Blake ZOX:[id number] DOB: 12/09/1950 DOA: 07/10/2022     9 DOS: the patient was seen and examined on 07/19/2022   Brief hospital course: 71yo presented with bloody stools and found to be in shock, requiring pressor support on ICU service. GI had been following, found to have ulcers in esophagus, stomach, duodenum. Now more stable off pressors, transferred to [facility]  Assessment and Plan: Hemorrhagic shock -Now off pressor support, hemodynamically stable -Hgb remains stable this AM -dark stool noted recently.  -Pt now s/p egd 12/30 with findings of grade 1 varices with moderate portal hypertensive gastropathy, one non-bleeding cratered gastric ulcer  -Cont on protonix  PBC with cirrhosis -Recheck lft in AM -LFT's improved this AM  GERD -Cont PPI as tolerated  Chronic diastolic CHF -Appears euvolemic  Hypokalemia -normal -recheck lytes in AM      Subjective: Seen while NPO awaiting EGD. Reports upset stomach, attributes to being NPO and hungry  Physical Exam: Vitals:   07/19/22 1341 07/19/22 1345 07/19/22 1400 07/19/22 1418  BP: 116/67 110/65 115/67 115/73  Pulse: (!) 59 87 82 79  Resp: (!) 21 18 18 17   Temp: 98 F (36.7 C)   98.2 F (36.8 C)  TempSrc: Temporal   Oral  SpO2: 100% 100% 94% 99%  Weight:      Height:       General exam: Conversant, in no acute distress Respiratory system: normal chest rise, clear, no audible wheezing Cardiovascular system: regular rhythm, s1-s2 Gastrointestinal system: Nondistended, nontender, pos BS Central nervous system: No seizures, no tremors Extremities: No cyanosis, no joint deformities Skin: No rashes, no pallor Psychiatry: Affect normal // no auditory hallucinations   Data Reviewed:  Labs reviewed: Na 139, K 3.7, Cr 0.41  Family Communication: Pt in room, family not at bedside  Disposition: Status is: Inpatient Remains inpatient appropriate because: Severity of illness  Planned  Discharge Destination: Home    Author: , MD 07/19/2022 5:34 PM  For on call review [email or web address]/21/[email or web address].

## 2022-07-19 NOTE — Anesthesia Postprocedure Evaluation (Signed)
Anesthesia Post Note  Patient: Hannah Blake  Procedure(s) Performed: ESOPHAGOGASTRODUODENOSCOPY (EGD) WITH PROPOFOL     Patient location during evaluation: PACU Anesthesia Type: MAC Level of consciousness: awake and alert Pain management: pain level controlled Vital Signs Assessment: post-procedure vital signs reviewed and stable Respiratory status: spontaneous breathing, nonlabored ventilation and respiratory function stable Cardiovascular status: blood pressure returned to baseline and stable Postop Assessment: no apparent nausea or vomiting Anesthetic complications: no   No notable events documented.  Last Vitals:  Vitals:   07/19/22 1400 07/19/22 1418  BP: 115/67 115/73  Pulse: 82 79  Resp: 18 17  Temp:  36.8 C  SpO2: 94% 99%    Last Pain:  Vitals:   07/19/22 1418  TempSrc: Oral  PainSc:                  Pervis Hocking

## 2022-07-19 NOTE — Interval H&P Note (Signed)
History and Physical Interval Note:  07/19/2022 1:18 PM  Hannah Blake  has presented today for surgery, with the diagnosis of GI bleed.  The various methods of treatment have been discussed with the patient and family. After consideration of risks, benefits and other options for treatment, the patient has consented to  Procedure(s): ESOPHAGOGASTRODUODENOSCOPY (EGD) WITH PROPOFOL (N/A) as a surgical intervention.  The patient's history has been reviewed, patient examined, no change in status, stable for surgery.  I have reviewed the patient's chart and labs.  Questions were answered to the patient's satisfaction.     Freddy Jaksch

## 2022-07-19 NOTE — Progress Notes (Signed)
Daily Progress Note   Patient Name: Hannah Blake       Date: 07/19/2022 DOB: [birth year]  Age: 71 y.o. MRN#: [id number] Attending Physician: Jerald Kief, MD Primary Care Physician: Rocky Morel, DO Admit Date: 07/10/2022  Reason for Consultation/Follow-up: Establishing goals of care  Subjective: Resting in bed, no distress, weak appearing lady resting in bed, denies pain.   Length of Stay: 9  Current Medications: Scheduled Meds:   feeding supplement  1 Container Oral TID BM   lidocaine  1 patch Transdermal Q24H   midodrine  10 mg Oral TID WC   pantoprazole  40 mg Oral BID    Continuous Infusions:  sodium chloride 250 mL (07/15/22 1943)    PRN Meds: benzonatate, docusate sodium, mouth rinse, polyethylene glycol  Physical Exam         Awake alert Resting in bed Regular work of breathing Abdomen not distended No edema  Vital Signs: BP 109/70 (BP Location: Right Arm)   Pulse 85   Temp 98.5 F (36.9 C) (Oral)   Resp 16   Ht 5' (1.524 m)   Wt 49.7 kg   SpO2 97%   BMI 21.40 kg/m  SpO2: SpO2: 97 % O2 Device: O2 Device: Room Air O2 Flow Rate: O2 Flow Rate (L/min): 1 L/min  Intake/output summary:  Intake/Output Summary (Last 24 hours) at 07/19/2022 1109 Last data filed at 07/18/2022 2150 Gross per 24 hour  Intake 720 ml  Output --  Net 720 ml   LBM: Last BM Date : 07/18/22 Baseline Weight: Weight: 45.6 kg Most recent weight: Weight: 49.7 kg       Palliative Assessment/Data:      Patient Active Problem List   Diagnosis Date Noted   Hemorrhagic shock (HCC) 07/10/2022   Chronic systolic CHF (congestive heart failure) (HCC) 07/07/2022   Iron deficiency anemia due to chronic blood loss 07/07/2022   Acute upper GI bleeding 07/05/2022   Primary biliary  cholangitis (HCC) 05/02/2022   Anemia of chronic disease 05/02/2022   GERD (gastroesophageal reflux disease) 05/02/2022   Esophageal varices in alcoholic cirrhosis (HCC) 01/15/2022   Multiple gastric ulcers 01/15/2022   Goals of care, counseling/discussion 01/15/2022   Heart failure with reduced ejection fraction (HCC) 01/15/2022   Hepatic cirrhosis (HCC) 01/13/2022   Protein-calorie malnutrition, severe 05/21/2021  Palliative Care Assessment & Plan   Patient Profile:    Assessment:  71 year old lady, known to palliative medicine service, seen in a previous hospitalization, admitted recently for hematemesis, patient required several units of packed red blood cell transfusions.  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  Patient was also recently hospitalized, patient admitted with dizziness while having bowel movement and found to have GI bleed, was with low blood pressures, was initially admitted to ICU now on the floor,with GI specialist following.  Palliative care consulted for CODE STATUS and goals of care discussions. Previous palliative consultation note reviewed, current palliative consult request received, chart reviewed, patient seen and examined-call placed and discussed with the grandson Hannah Blake:  Recommendations/Plan:  Family notified of EGD today and that patient had small melenic stools overnight, full code full scope for now, PT OT has recommended SNF rehab attempt, recommend palliative services following along in the outpatient setting.    Code Status:    Code Status Orders  (From admission, onward)           Start     Ordered   07/10/22 1823  Full code  Continuous       Question:  By:  Answer:  Default: patient does not have capacity for decision making, no surrogate or prior directive available   07/10/22 1827           Code Status History     Date Active Date Inactive  Code Status Order ID Comments User Context   07/05/2022 2253 07/09/2022 1710 Full Code [id number]  Martina Sinner, MD ED   01/13/2022 0550 01/17/2022 1924 Full Code [id number]  Eduard Clos, MD Inpatient   05/20/2021 2210 05/23/2021 2323 Full Code [id number]  Darlin Drop, DO Inpatient       Prognosis:  Unable to determine  Discharge Planning: Skilled Nursing Facility for rehab with Palliative care service follow-up  Care plan was discussed with  grandson Hannah Blake on phone unable to reach HCPOA grandson Hannah Blake.   Thank you for allowing the Palliative Medicine Team to assist in the care of this patient.  Mod MDM.      Greater than 50%  of this time was spent counseling and coordinating care related to the above assessment and plan.  Rosalin Hawking, MD  Please contact Palliative Medicine Team phone at [phone] for questions and concerns.

## 2022-07-19 NOTE — Transfer of Care (Signed)
Immediate Anesthesia Transfer of Care Note  Patient: Hannah Blake  Procedure(s) Performed: Procedure(s): ESOPHAGOGASTRODUODENOSCOPY (EGD) WITH PROPOFOL (N/A)  Patient Location: PACU and Endoscopy Unit  Anesthesia Type:MAC  Level of Consciousness: awake, alert  and oriented  Airway & Oxygen Therapy: Patient Spontanous Breathing and Patient connected to nasal cannula oxygen  Post-op Assessment: Report given to RN and Post -op Vital signs reviewed and stable  Post vital signs: Reviewed and stable  Last Vitals:  Vitals:   07/19/22 0422 07/19/22 1309  BP: 109/70 (!) 132/59  Pulse: 85 87  Resp: 16 16  Temp: 36.9 C 37.1 C  SpO2: 74% 45%    Complications: No apparent anesthesia complications

## 2022-07-19 NOTE — Anesthesia Preprocedure Evaluation (Addendum)
Anesthesia Evaluation  Patient identified by MRN, date of birth, ID band Patient awake    Reviewed: Allergy & Precautions, NPO status , Patient's Chart, lab work & pertinent test results  Airway Mallampati: III  TM Distance: >3 FB Neck ROM: Full    Dental  (+) Dental Advisory Given   Pulmonary Current Smoker 40 pack year history    Pulmonary exam normal breath sounds clear to auscultation       Cardiovascular + CAD and +CHF (LVEF 40-45%)   Rhythm:Regular Rate:Normal  Last echo 05/2021 1. LVEF is 45 to 50% with hypokinesis/akinesis of the distal  inferior,distal lateral,distal inferoseptal walls . Left ventricular  ejection fraction, by estimation, is 40 to 45%. The left ventricle has  mildly decreased function. Indeterminate diastolic  filling due to E-A fusion.   2. Right ventricular systolic function is normal. The right ventricular  size is normal.   3. Left atrial size was mildly dilated.   4. The mitral valve is normal in structure. Trivial mitral valve  regurgitation.   5. The aortic valve is normal in structure. Aortic valve regurgitation is  mild.     Neuro/Psych negative neurological ROS  negative psych ROS   GI/Hepatic PUD,GERD  Controlled,,(+) Cirrhosis   Esophageal Varices    EGD 07/06/2022 with findings of few linear esophageal ulcers with stigmata of recent bleeding, nonbleeding superficial gastric ulcer 15 mm dimension, 2 nonbleeding superficial gastric ulcers 8 mm dimension, few nonbleeding superficial duodenal ulcers no stigmata of bleeding             -EGD 07/11/2022 with findings of red blood in upper third of esophagus, clotted blood in entire examined stomach, red blood in entire duodenum, mucosal visualization limited, no obvious esophageal varices noted             -CT angiogram 07/12/2022 with findings of small focus of active intraluminal extravasation from posterior wall of stomach              -S/p interventional radiology angiogram 07/12/2022 with embolization of bleeding vessel (branch of left gastric artery)    Endo/Other  negative endocrine ROS    Renal/GU negative Renal ROS  negative genitourinary   Musculoskeletal negative musculoskeletal ROS (+)    Abdominal   Peds  Hematology Hb 9.2 this AM, initially shock 2/2 GIB    Anesthesia Other Findings   Reproductive/Obstetrics negative OB ROS                              Anesthesia Physical Anesthesia Plan  ASA: 3  Anesthesia Plan: MAC   Post-op Pain Management:    Induction:   PONV Risk Score and Plan: Treatment may vary due to age or medical condition, Propofol infusion and TIVA  Airway Management Planned: Natural Airway and Simple Face Mask  Additional Equipment: None  Intra-op Plan:   Post-operative Plan:   Informed Consent: I have reviewed the patients History and Physical, chart, labs and discussed the procedure including the risks, benefits and alternatives for the proposed anesthesia with the patient or authorized representative who has indicated his/her understanding and acceptance.     Dental advisory given and Consent reviewed with POA  Plan Discussed with: CRNA  Anesthesia Plan Comments:         Anesthesia Quick Evaluation

## 2022-07-19 NOTE — Progress Notes (Signed)
Pt has had 3 small maroon colored stools during the night. Hilton Sinclair BSN RN CMSRN 07/19/2022, 6:45 AM

## 2022-07-19 NOTE — Op Note (Signed)
[facility] Patient Name: Hannah Blake Procedure Date: 07/19/2022 MRN: [id number] Attending MD: Willis Modena , MD, [id number] Date of Birth: 1950/08/16 CSN: [id number] Age: 71 Admit Type: Outpatient Procedure:                Upper GI endoscopy Indications:              Acute post hemorrhagic anemia, Melena, Recent                            gastrointestinal bleeding Providers:                Willis Modena, MD, Margaree Mackintosh, RN, Harrington Challenger, Technician Referring MD:             [facility]. Medicines:                Monitored Anesthesia Care Complications:            No immediate complications. Estimated Blood Loss:     Estimated blood loss: none. Procedure:                Pre-Anesthesia Assessment:                           - Prior to the procedure, a History and Physical                            was performed, and patient medications and                            allergies were reviewed. The patient's tolerance of                            previous anesthesia was also reviewed. The risks                            and benefits of the procedure and the sedation                            options and risks were discussed with the patient.                            All questions were answered, and informed consent                            was obtained. Prior Anticoagulants: The patient has                            taken no anticoagulant or antiplatelet agents. ASA                            Grade Assessment: III - A patient with severe  systemic disease. After reviewing the risks and                            benefits, the patient was deemed in satisfactory                            condition to undergo the procedure.                           After obtaining informed consent, the endoscope was                            passed under direct vision. Throughout the                             procedure, the patient's blood pressure, pulse, and                            oxygen saturations were monitored continuously. The                            [id number]-[id number] ([id number] Olympus endoscope was introduced                            through the mouth, and advanced to the second part                            of duodenum. The upper GI endoscopy was                            accomplished without difficulty. The patient                            tolerated the procedure well. Scope In: Scope Out: Findings:      Grade I varices were found in the lower third of the esophagus. They       were small in size.      The exam of the esophagus was otherwise normal.      Moderate portal hypertensive gastropathy was found in the entire       examined stomach.      One non-bleeding cratered gastric ulcer with pigmented material was       found at the incisura (lesser curve, junction body/fundus). The lesion       was 15 mm in largest dimension.      The exam of the stomach was otherwise normal.      The duodenal bulb, first portion of the duodenum and second portion of       the duodenum were normal.      No old or fresh blood was seen to the extent of our examination. Impression:               - Grade I esophageal varices.                           - Portal hypertensive gastropathy.                           -  Non-bleeding gastric ulcer with pigmented                            material.                           - Normal duodenal bulb, first portion of the                            duodenum and second portion of the duodenum.                           - No specimens collected. Moderate Sedation:      None Recommendation:           - Return patient to hospital ward for ongoing care.                           - Full liquid diet today.                           - No ASA/NSAIDs.                           - Change pantoprazole to 40 mg IV Q12 hrs for the                            next 48-72  hours.                           - Otherwise continue present medications.                           - Patient has had multiple endoscopic procedures                            for her bleeding; when/if she has another episode                            of dramatic rebleeding, she needs Interventional                            Radiology consultation for consideration of                            embolization. We've already tried endoscopy with                            acute destabilizing bleeding emergently and could                            find/do nothing. Current pigmented spot too large                            to treat endoscopically. There would be no utility  for repeat endoscopy in the (albeit unlikely) event                            of dramatic rebleeding.                           - [name] will follow. Procedure Code(s):        --- Professional ---                           [phone], Esophagogastroduodenoscopy, flexible,                            transoral; diagnostic, including collection of                            specimen(s) by brushing or washing, when performed                            (separate procedure) Diagnosis Code(s):        --- Professional ---                           I85.00, Esophageal varices without bleeding                           K76.6, Portal hypertension                           K31.89, Other diseases of stomach and duodenum                           K25.9, Gastric ulcer, unspecified as acute or                            chronic, without hemorrhage or perforation                           D62, Acute posthemorrhagic anemia                           K92.1, Melena (includes Hematochezia)                           K92.2, Gastrointestinal hemorrhage, unspecified CPT copyright 2022 American Medical Association. All rights reserved. The codes documented in this report are preliminary and upon coder review may  be  revised to meet current compliance requirements. Willis Modena, MD 07/19/2022 1:44:47 PM This report has been signed electronically. Number of Addenda: 0

## 2022-07-20 ENCOUNTER — Encounter (HOSPITAL_COMMUNITY): Payer: Self-pay | Admitting: Gastroenterology

## 2022-07-20 LAB — COMPREHENSIVE METABOLIC PANEL
ALT: 40 U/L (ref 0–44)
AST: 52 U/L — ABNORMAL HIGH (ref 15–41)
Albumin: 3.2 g/dL — ABNORMAL LOW (ref 3.5–5.0)
Alkaline Phosphatase: 301 U/L — ABNORMAL HIGH (ref 38–126)
Anion gap: 5 (ref 5–15)
BUN: 8 mg/dL (ref 8–23)
CO2: 26 mmol/L (ref 22–32)
Calcium: 8.3 mg/dL — ABNORMAL LOW (ref 8.9–10.3)
Chloride: 104 mmol/L (ref 98–111)
Creatinine, Ser: 0.49 mg/dL (ref 0.44–1.00)
GFR, Estimated: >60 mL/min (ref >60)
Glucose, Bld: 93 mg/dL (ref 70–99)
Potassium: 3.9 mmol/L (ref 3.5–5.1)
Sodium: 135 mmol/L (ref 135–145)
Total Bilirubin: 2.1 mg/dL — ABNORMAL HIGH (ref 0.3–1.2)
Total Protein: 6.2 g/dL — ABNORMAL LOW (ref 6.5–8.1)

## 2022-07-20 LAB — CBC
HCT: 30.3 % — ABNORMAL LOW (ref 36.0–46.0)
Hemoglobin: 9.4 g/dL — ABNORMAL LOW (ref 12.0–15.0)
MCH: 31.2 pg (ref 26.0–34.0)
MCHC: 31 g/dL (ref 30.0–36.0)
MCV: 100.7 fL — ABNORMAL HIGH (ref 80.0–100.0)
Platelets: 144 10*3/uL — ABNORMAL LOW (ref 150–400)
RBC: 3.01 MIL/uL — ABNORMAL LOW (ref 3.87–5.11)
RDW: 23.3 % — ABNORMAL HIGH (ref 11.5–15.5)
WBC: 5.8 10*3/uL (ref 4.0–10.5)
nRBC: 0 % (ref 0.0–0.2)

## 2022-07-20 LAB — MAGNESIUM: Magnesium: 2.2 mg/dL (ref 1.7–2.4)

## 2022-07-20 MED ORDER — CALCIUM CARBONATE ANTACID 500 MG PO CHEW: 1.0000 | CHEWABLE_TABLET | Freq: Three times a day (TID) | ORAL | Status: DC | PRN

## 2022-07-20 NOTE — Plan of Care (Signed)
  Problem: Education: Goal: Knowledge of General Education information will improve Description: Including pain rating scale, medication(s)/side effects and non-pharmacologic comfort measures Outcome: Progressing   Problem: Health Behavior/Discharge Planning: Goal: Ability to manage health-related needs will improve Outcome: Progressing   Problem: Clinical Measurements: Goal: Ability to maintain clinical measurements within normal limits will improve Outcome: Progressing Goal: Will remain free from infection Outcome: Progressing Goal: Diagnostic test results will improve Outcome: Progressing Goal: Respiratory complications will improve Outcome: Progressing Goal: Cardiovascular complication will be avoided Outcome: Progressing   Problem: Activity: Goal: Risk for activity intolerance will decrease Outcome: Progressing   Problem: Nutrition: Goal: Adequate nutrition will be maintained Outcome: Progressing   Problem: Coping: Goal: Level of anxiety will decrease Outcome: Progressing   Problem: Elimination: Goal: Will not experience complications related to bowel motility Outcome: Progressing Goal: Will not experience complications related to urinary retention Outcome: Progressing   Problem: Pain Managment: Goal: General experience of comfort will improve Outcome: Progressing   Problem: Safety: Goal: Ability to remain free from injury will improve Outcome: Progressing   Problem: Skin Integrity: Goal: Risk for impaired skin integrity will decrease Outcome: Progressing   Problem: Education: Goal: Ability to identify signs and symptoms of gastrointestinal bleeding will improve Outcome: Progressing   Problem: Bowel/Gastric: Goal: Will show no signs and symptoms of gastrointestinal bleeding Outcome: Progressing   Problem: Fluid Volume: Goal: Will show no signs and symptoms of excessive bleeding Outcome: Progressing   Problem: Clinical Measurements: Goal:  Complications related to the disease process, condition or treatment will be avoided or minimized Outcome: Progressing   Problem: Education: Goal: Understanding of CV disease, CV risk reduction, and recovery process will improve Outcome: Progressing Goal: Individualized Educational Video(s) Outcome: Progressing   Problem: Activity: Goal: Ability to return to baseline activity level will improve Outcome: Progressing   Problem: Cardiovascular: Goal: Ability to achieve and maintain adequate cardiovascular perfusion will improve Outcome: Progressing Goal: Vascular access site(s) Level 0-1 will be maintained Outcome: Progressing   Problem: Cardiovascular: Goal: Vascular access site(s) Level 0-1 will be maintained Outcome: Progressing   Problem: Health Behavior/Discharge Planning: Goal: Ability to safely manage health-related needs after discharge will improve Outcome: Progressing   Problem: Safety: Goal: Non-violent Restraint(s) Outcome: Progressing

## 2022-07-20 NOTE — Progress Notes (Addendum)
  Progress Note   Patient: Hannah Blake TSV:[id number] DOB: 1951-04-30 DOA: 07/10/2022     10 DOS: the patient was seen and examined on 07/20/2022   Brief hospital course: 71yo presented with bloody stools and found to be in shock, requiring pressor support on ICU service. GI had been following, found to have ulcers in esophagus, stomach, duodenum. Now more stable off pressors, transferred to [facility]  Assessment and Plan: Hemorrhagic shock -Now off pressor support, hemodynamically stable -Hgb remains stable this AM -Pt now s/p egd 12/30 with findings of grade 1 varices with moderate portal hypertensive gastropathy, one non-bleeding cratered gastric ulcer  -GI recs for PPI IV BID x another 24hrs, advance to soft diet, if re-bleeds, needs IR consult for consideration for embolization -Hopeful d/c home in 1-2 days  PBC with cirrhosis -Recheck lft in AM  GERD -Cont PPI as tolerated per above  Chronic diastolic CHF -Appears euvolemic  Hypokalemia -within normal limits this AM -recheck lytes in AM      Subjective: Hoping to go home soon  Physical Exam: Vitals:   07/19/22 1959 07/20/22 0428 07/20/22 0500 07/20/22 1251  BP: 117/62 109/70  135/69  Pulse: 81 79  73  Resp: 18 18  18   Temp: 98.3 F (36.8 C) 98.9 F (37.2 C)  98.1 F (36.7 C)  TempSrc: Oral Oral  Oral  SpO2: 98% 94%  99%  Weight:   47.9 kg   Height:       General exam: Awake, laying in bed, in nad Respiratory system: Normal respiratory effort, no wheezing Cardiovascular system: regular rate, s1, s2 Gastrointestinal system: Soft, nondistended, positive BS Central nervous system: CN2-12 grossly intact, strength intact Extremities: Perfused, no clubbing Skin: Normal skin turgor, no notable skin lesions seen Psychiatry: Mood normal // no visual hallucinations   Data Reviewed:  Labs reviewed: Na 135, K 3.9, Cr 0.49  Family Communication: Pt in room, family not at bedside  Disposition: Status is:  Inpatient Remains inpatient appropriate because: Severity of illness  Planned Discharge Destination: Skilled nursing facility    Author: , MD 07/20/2022 3:37 PM  For on call review [email or web address].

## 2022-07-20 NOTE — Progress Notes (Signed)
Subjective: No further melena. No abdominal pain.  Objective: Vital signs in last 24 hours: Temp:  [98.1 F (36.7 C)-98.9 F (37.2 C)] 98.1 F (36.7 C) (12/31 1251) Pulse Rate:  [73-81] 73 (12/31 1251) Resp:  [18] 18 (12/31 1251) BP: (109-135)/(62-70) 135/69 (12/31 1251) SpO2:  [94 %-99 %] 99 % (12/31 1251) Weight:  [47.9 kg] 47.9 kg (12/31 0500) Weight change:  Last BM Date : 07/20/22  PE: GEN:  Thin, frail-appearing, NAD ABD:  Soft, non-tender NEURO:  No encephalopathy  Lab Results: CBC    Component Value Date/Time   WBC 5.8 07/20/2022 0707   RBC 3.01 (L) 07/20/2022 0707   HGB 9.4 (L) 07/20/2022 0707   HGB 8.0 (L) 05/27/2021 1504   HCT 30.3 (L) 07/20/2022 0707   HCT 22.7 (L) 05/27/2021 1504   PLT 144 (L) 07/20/2022 0707   PLT 221 05/27/2021 1504   MCV 100.7 (H) 07/20/2022 0707   MCV 88 05/27/2021 1504   MCH 31.2 07/20/2022 0707   MCHC 31.0 07/20/2022 0707   RDW 23.3 (H) 07/20/2022 0707   RDW 15.8 (H) 05/27/2021 1504   LYMPHSABS 1.3 07/15/2022 0306   LYMPHSABS 2.5 05/27/2021 1504   MONOABS 0.7 07/15/2022 0306   EOSABS 0.3 07/15/2022 0306   EOSABS 0.3 05/27/2021 1504   BASOSABS 0.0 07/15/2022 0306   BASOSABS 0.1 05/27/2021 1504  CMP     Component Value Date/Time   NA 135 07/20/2022 0707   NA 132 (L) 05/27/2021 1504   K 3.9 07/20/2022 0707   CL 104 07/20/2022 0707   CO2 26 07/20/2022 0707   GLUCOSE 93 07/20/2022 0707   BUN 8 07/20/2022 0707   BUN 20 05/27/2021 1504   CREATININE 0.49 07/20/2022 0707   CALCIUM 8.3 (L) 07/20/2022 0707   PROT 6.2 (L) 07/20/2022 0707   PROT 8.0 05/27/2021 1504   ALBUMIN 3.2 (L) 07/20/2022 0707   ALBUMIN 2.4 (L) 05/27/2021 1504   AST 52 (H) 07/20/2022 0707   ALT 40 07/20/2022 0707   ALKPHOS 301 (H) 07/20/2022 0707   BILITOT 2.1 (H) 07/20/2022 0707   BILITOT 5.2 (H) 05/27/2021 1504   GFRNONAA >60 07/20/2022 0707    Assessment:   Cirrhosis with primary biliary cholangitis. Recurrent upper GI bleeding from gastric  ulcer.  Endoscopy today no active bleeding. Acute blood loss anemia.  Plan:   PPI IV bid for another 24 hours. Advance diet to soft. Follow CBCs. If rebleeds, needs IR consultation for consideration of embolization (see EGD note yesterday for details). [name] will follow; hopefully home in the next 1-2 days.  Freddy Jaksch 07/20/2022, 3:26 PM   Cell [phone] If no answer or after 5 PM call [phone]

## 2022-07-21 LAB — CBC
HCT: 28.6 % — ABNORMAL LOW (ref 36.0–46.0)
Hemoglobin: 9.1 g/dL — ABNORMAL LOW (ref 12.0–15.0)
MCH: 31.9 pg (ref 26.0–34.0)
MCHC: 31.8 g/dL (ref 30.0–36.0)
MCV: 100.4 fL — ABNORMAL HIGH (ref 80.0–100.0)
Platelets: 151 10*3/uL (ref 150–400)
RBC: 2.85 MIL/uL — ABNORMAL LOW (ref 3.87–5.11)
RDW: 22.2 % — ABNORMAL HIGH (ref 11.5–15.5)
WBC: 5.9 10*3/uL (ref 4.0–10.5)
nRBC: 0 % (ref 0.0–0.2)

## 2022-07-21 LAB — COMPREHENSIVE METABOLIC PANEL
ALT: 37 U/L (ref 0–44)
AST: 53 U/L — ABNORMAL HIGH (ref 15–41)
Albumin: 2.9 g/dL — ABNORMAL LOW (ref 3.5–5.0)
Alkaline Phosphatase: 340 U/L — ABNORMAL HIGH (ref 38–126)
Anion gap: 7 (ref 5–15)
BUN: 9 mg/dL (ref 8–23)
CO2: 23 mmol/L (ref 22–32)
Calcium: 8.1 mg/dL — ABNORMAL LOW (ref 8.9–10.3)
Chloride: 103 mmol/L (ref 98–111)
Creatinine, Ser: 0.6 mg/dL (ref 0.44–1.00)
GFR, Estimated: >60 mL/min (ref >60)
Glucose, Bld: 90 mg/dL (ref 70–99)
Potassium: 3.7 mmol/L (ref 3.5–5.1)
Sodium: 133 mmol/L — ABNORMAL LOW (ref 135–145)
Total Bilirubin: 2 mg/dL — ABNORMAL HIGH (ref 0.3–1.2)
Total Protein: 6 g/dL — ABNORMAL LOW (ref 6.5–8.1)

## 2022-07-21 MED ORDER — DOCUSATE SODIUM 100 MG PO CAPS
100.0000 mg | ORAL_CAPSULE | Freq: Two times a day (BID) | ORAL | 0 refills | Status: DC | PRN
  Filled 2022-07-21: qty 30, 15d supply, fill #0

## 2022-07-21 MED ORDER — LIDOCAINE 5 % EX PTCH
1.0000 | MEDICATED_PATCH | CUTANEOUS | 0 refills | Status: DC
  Filled 2022-07-21: qty 5, 5d supply, fill #0

## 2022-07-21 MED ORDER — MIDODRINE HCL 10 MG PO TABS
5.0000 mg | ORAL_TABLET | Freq: Two times a day (BID) | ORAL | 0 refills | Status: DC
  Filled 2022-07-21: qty 30, 30d supply, fill #0

## 2022-07-21 NOTE — TOC Progression Note (Addendum)
Transition of Care [facility]) - Progression Note    Patient Details  Name: Hannah Blake MRN: [id number] Date of Birth: 05-Dec-1950  Transition of [facility] CM/SW Contact  Lennart Pall, LCSW Phone Number: 07/21/2022, 11:46 AM  Clinical Narrative:    Met briefly with pt, however, english is very limited.  She asks that I speak with grandson.  Contacted grandson, [place], to review PT recommendation for SNF.  He plans to speak with pt today to encourage her agreement with plan as she is alone in the home when daughter is at work.  Grandson to call this CSW when he has spoken with pt.  ADDENDUM:  Alerted by PT that pt's mobility and safety awareness much  better today and only requiring supervision overall.  Have alerted grandson of this and note that if this continues, the recs for SNF will likely change to [facility].  He is pleased with this as well and will await updates from TOC on dc recs.  1630:  DC order placed for dc home today and HHPT orders.  Able to secure HHPT coverage with [facility] and information placed on AVS.  Pt has RW at home.  No further TOC needs.    Barriers to Discharge: Continued Medical Work up  Expected Discharge Plan and Services In-house Referral: NA Discharge Planning Services: CM Consult   Living arrangements for the past 2 months: Single Family Home                 DME Arranged: N/A DME Agency: NA       HH Arranged: NA [place] Agency: NA         Social Determinants of Health (SDOH) Interventions [place]: No Food Insecurity (07/11/2022)  Housing: Low Risk  (07/10/2022)  Transportation Needs: No Transportation Needs (07/11/2022)  Utilities: Not At Risk (07/11/2022)  Depression (PHQ2-9): Low Risk  (05/27/2021)  Tobacco Use: High Risk (07/20/2022)    Readmission Risk Interventions     No data to display

## 2022-07-21 NOTE — Care Management Important Message (Signed)
Important Message  Patient Details IM Letter given. Name: MALIJAH LIETZ MRN: [id number] Date of Birth: 05-09-1951   Medicare Important Message Given:  Yes     Kerin Salen 07/21/2022, 11:21 AM

## 2022-07-21 NOTE — Discharge Summary (Signed)
Physician Discharge Summary   Patient: Hannah Blake MRN: [id number] DOB: 1951/03/18  Admit date:     07/10/2022  Discharge date: 07/21/22  Discharge Physician: Marylu Lund   PCP: Johny Blamer, DO   Recommendations at discharge:    Follow up with PCP in 1-2 weeks Follow up with GI as scheduled  Discharge Diagnoses: Principal Problem:   Hemorrhagic shock ([place])  Resolved Problems:   * No resolved hospital problems. [facility] Course: 72yo presented with bloody stools and found to be in shock, requiring pressor support on ICU service. GI had been following, found to have ulcers in esophagus, stomach, duodenum. Now more stable off pressors, transferred to [facility]  Assessment and Plan: Hemorrhagic shock -Now off pressor support, hemodynamically stable -Hgb remains stable this AM -Pt now s/p egd 12/30 with findings of grade 1 varices with moderate portal hypertensive gastropathy, one non-bleeding cratered gastric ulcer  -Remained stable post endoscopy. GI recs for PPI BID upon discharge, which pt had been already taking per [facility] -Pt to follow up with GI after discharge   PBC with cirrhosis -Stable -GI had been following   GERD -Cont PPI as tolerated per above   Chronic diastolic CHF -Appears euvolemic   Hypokalemia -within normal limits this AM       Consultants: PCCM, GI, Palliative Care Procedures performed: EGD  Disposition: Home Diet recommendation:  Soft DISCHARGE MEDICATION: Allergies as of 07/21/2022   No Known Allergies      Medication List     STOP taking these medications    ciprofloxacin 500 MG tablet Commonly known as: CIPRO   furosemide 20 MG tablet Commonly known as: LASIX   spironolactone 50 MG tablet Commonly known as: ALDACTONE       TAKE these medications    docusate sodium 100 MG capsule Commonly known as: COLACE Take 1 capsule (100 mg total) by mouth 2 (two) times daily as needed for mild constipation.   lidocaine 5  % Commonly known as: LIDODERM Place 1 patch onto the skin daily. Remove & Discard patch within 12 hours or as directed by MD   midodrine 10 MG tablet Commonly known as: PROAMATINE Take 0.5 tablets (5 mg total) by mouth 2 (two) times daily with a meal.   pantoprazole 40 MG tablet Commonly known as: Protonix Take 1 tablet (40 mg total) by mouth 2 (two) times daily before a meal.   ursodiol 300 MG capsule Commonly known as: ACTIGALL Take 2 capsules (600 mg total) by mouth 2 (two) times daily.        Follow-up Information     Johny Blamer, DO Follow up in 2 week(s).   Why: Hospital follow up Contact information: [place] [address] [phone]         Otis Brace, MD Follow up.   Specialty: Gastroenterology Why: Hospital follow up Contact information: [address] [place] [address] [phone]                Discharge Exam: Danley Danker Weights   07/18/22 0331 07/20/22 0500 07/21/22 0527  Weight: 49.7 kg 47.9 kg 46.9 kg   General exam: Awake, laying in bed, in nad Respiratory system: Normal respiratory effort, no wheezing Cardiovascular system: regular rate, s1, s2 Gastrointestinal system: Soft, nondistended, positive BS Central nervous system: CN2-12 grossly intact, strength intact Extremities: Perfused, no clubbing Skin: Normal skin turgor, no notable skin lesions seen Psychiatry: Mood normal // no visual hallucinations   Condition  at discharge: fair  The results of significant diagnostics from this hospitalization (including imaging, microbiology, ancillary and laboratory) are listed below for reference.   Imaging Studies: DG Abd 1 View  Result Date: 07/17/2022 CLINICAL DATA:  [id number].  Abdominal distention. EXAM: ABDOMEN - 1 VIEW COMPARISON:  CT of the abdomen and pelvis on 07/12/2022 FINDINGS: Bowel gas pattern is nonobstructive. Endovascular coils overlie the LEFT UPPER QUADRANT. No evidence for free  intraperitoneal air. There is dense opacification in the LEFT LOWER lobe, consistent with atelectasis or infiltrate. There is atherosclerotic calcification the abdominal aorta. IMPRESSION: 1. Nonobstructive bowel gas pattern. 2. LEFT LOWER lobe atelectasis or infiltrate. Electronically Signed   By: Norva PavlovElizabeth  Brown M.D.   On: 07/17/2022 15:54   IR Angiogram Visceral Selective  Result Date: 07/12/2022 INDICATION: Upper GI bleeding, large volume hematemesis, hemorrhagic shock EXAM: 1. Ultrasound-guided puncture of the left common femoral artery 2. Catheterization and angiography of the celiac artery 3. Selective catheterization angiography of the left gastric artery 4. Selective catheterization angiography of a branch of the left gastric artery 5. Embolization of the left gastric artery MEDICATIONS: Documented in the EMR ANESTHESIA/SEDATION: Moderate (conscious) sedation was employed during this procedure. A total of Versed 2 mg and Fentanyl 100 mcg was administered intravenously by the radiology nurse. Total intra-service moderate Sedation Time: 66 minutes. The patient's level of consciousness and vital signs were monitored continuously by radiology nursing throughout the procedure under my direct supervision. CONTRAST:  145 mL Omnipaque 300 administered intra-arterially FLUOROSCOPY: Radiation Exposure Index (as provided by the fluoroscopic device): 66 minutes (862 mGy) COMPLICATIONS: None immediate. PROCEDURE: Informed consent was obtained from the patient following explanation of the procedure, risks, benefits and alternatives. The patient understands, agrees and consents for the procedure. All questions were addressed. A time out was performed prior to the initiation of the procedure. Maximal barrier sterile technique utilized including caps, mask, sterile gowns, sterile gloves, large sterile drape, hand hygiene, and Betadine prep. The patient was placed supine on the exam table. The left groin was prepped  and draped in the standard sterile fashion. Ultrasound was used to evaluate the left common femoral artery, which found to be patent and suitable for access. An ultrasound image was permanently stored in the electronic medical record. Using ultrasound guidance, the left common femoral artery was directly punctured using a 21 gauge micropuncture set. An 018 wire was advanced centrally, followed by serial tract dilation and placement of a 5 French sheath. Using a combination of a C2 catheter and Glidewire, the celiac artery was catheterized. Angiography of the celiac artery was performed in multiple obliquities to study the anatomy, demonstrating expected branching anatomy with particular attention to the left gastric and splenic arteries based on expected location of hemorrhage on CTA imaging. No abnormal vessel branch was identified from the proximal splenic artery in the expected area of the short gastric arteries. There was suspicious appearance of a vessel cutoff/extravasation adjacent to an inferior branch of the left gastric artery. The decision was then made to proceed with selective catheterization of the left gastric artery. Using combination of a 2.8 [place] Progreat Omega microcatheter and fathom 16 microwire, the left gastric artery was selectively catheterized. Additional angiography of the left gastric artery demonstrated similar findings as seen before, with concern for vessel cutoff and active extravasation along a medial branch of the left gastric artery. Microcatheter and wire were then advanced further and the medial branch of the left gastric artery selectively catheterized. Additional angiography of this branch  of the left gastric artery confirmed the presence of active gastrointestinal hemorrhage along the medial border of the stomach. This was consistent with findings on recent CTA. The decision was then made to proceed with embolization of this branch of the left gastric artery. Using a  combination of 3 mm and packing Ruby coils, this branch of the left gastric artery and the more proximal left gastric artery were successfully embolized. Completion angiography of both the proximal left gastric artery and celiac artery demonstrated no further findings of active bleeding. At the end of the procedure, all catheters and wires were removed. Hemostasis was achieved at the access site using Angio-Seal vascular closure device after limited hand injection demonstrated appropriate level of access and vessel caliber. Distal pulses were similar. A clean dressing was placed. The patient tolerated the procedure well without immediate complication. IMPRESSION: Mesenteric angiogram of the upper GI vessels demonstrated suspected bleed originating from a distal branch of the left gastric artery, consistent with location of bleed identified on CTA. The left gastric artery was successfully embolized using coils, with no further angiographic findings of continued bleeding at the end of the procedure. Electronically Signed   By: Olive Bass M.D.   On: 07/12/2022 16:46   IR US Guide Vasc Access Left  Result Date: 07/12/2022 INDICATION: Upper GI bleeding, large volume hematemesis, hemorrhagic shock EXAM: 1. Ultrasound-guided puncture of the left common femoral artery 2. Catheterization and angiography of the celiac artery 3. Selective catheterization angiography of the left gastric artery 4. Selective catheterization angiography of a branch of the left gastric artery 5. Embolization of the left gastric artery MEDICATIONS: Documented in the EMR ANESTHESIA/SEDATION: Moderate (conscious) sedation was employed during this procedure. A total of Versed 2 mg and Fentanyl 100 mcg was administered intravenously by the radiology nurse. Total intra-service moderate Sedation Time: 66 minutes. The patient's level of consciousness and vital signs were monitored continuously by radiology nursing throughout the procedure under my  direct supervision. CONTRAST:  145 mL Omnipaque 300 administered intra-arterially FLUOROSCOPY: Radiation Exposure Index (as provided by the fluoroscopic device): 66 minutes (862 mGy) COMPLICATIONS: None immediate. PROCEDURE: Informed consent was obtained from the patient following explanation of the procedure, risks, benefits and alternatives. The patient understands, agrees and consents for the procedure. All questions were addressed. A time out was performed prior to the initiation of the procedure. Maximal barrier sterile technique utilized including caps, mask, sterile gowns, sterile gloves, large sterile drape, hand hygiene, and Betadine prep. The patient was placed supine on the exam table. The left groin was prepped and draped in the standard sterile fashion. Ultrasound was used to evaluate the left common femoral artery, which found to be patent and suitable for access. An ultrasound image was permanently stored in the electronic medical record. Using ultrasound guidance, the left common femoral artery was directly punctured using a 21 gauge micropuncture set. An 018 wire was advanced centrally, followed by serial tract dilation and placement of a 5 French sheath. Using a combination of a C2 catheter and Glidewire, the celiac artery was catheterized. Angiography of the celiac artery was performed in multiple obliquities to study the anatomy, demonstrating expected branching anatomy with particular attention to the left gastric and splenic arteries based on expected location of hemorrhage on CTA imaging. No abnormal vessel branch was identified from the proximal splenic artery in the expected area of the short gastric arteries. There was suspicious appearance of a vessel cutoff/extravasation adjacent to an inferior branch of the left gastric artery.  The decision was then made to proceed with selective catheterization of the left gastric artery. Using combination of a 2.8 [place] Progreat Omega microcatheter  and fathom 16 microwire, the left gastric artery was selectively catheterized. Additional angiography of the left gastric artery demonstrated similar findings as seen before, with concern for vessel cutoff and active extravasation along a medial branch of the left gastric artery. Microcatheter and wire were then advanced further and the medial branch of the left gastric artery selectively catheterized. Additional angiography of this branch of the left gastric artery confirmed the presence of active gastrointestinal hemorrhage along the medial border of the stomach. This was consistent with findings on recent CTA. The decision was then made to proceed with embolization of this branch of the left gastric artery. Using a combination of 3 mm and packing Ruby coils, this branch of the left gastric artery and the more proximal left gastric artery were successfully embolized. Completion angiography of both the proximal left gastric artery and celiac artery demonstrated no further findings of active bleeding. At the end of the procedure, all catheters and wires were removed. Hemostasis was achieved at the access site using Angio-Seal vascular closure device after limited hand injection demonstrated appropriate level of access and vessel caliber. Distal pulses were similar. A clean dressing was placed. The patient tolerated the procedure well without immediate complication. IMPRESSION: Mesenteric angiogram of the upper GI vessels demonstrated suspected bleed originating from a distal branch of the left gastric artery, consistent with location of bleed identified on CTA. The left gastric artery was successfully embolized using coils, with no further angiographic findings of continued bleeding at the end of the procedure. Electronically Signed   By: Olive Bass M.D.   On: 07/12/2022 16:46   IR Angiogram Selective Each Additional Vessel  Result Date: 07/12/2022 INDICATION: Upper GI bleeding, large volume hematemesis,  hemorrhagic shock EXAM: 1. Ultrasound-guided puncture of the left common femoral artery 2. Catheterization and angiography of the celiac artery 3. Selective catheterization angiography of the left gastric artery 4. Selective catheterization angiography of a branch of the left gastric artery 5. Embolization of the left gastric artery MEDICATIONS: Documented in the EMR ANESTHESIA/SEDATION: Moderate (conscious) sedation was employed during this procedure. A total of Versed 2 mg and Fentanyl 100 mcg was administered intravenously by the radiology nurse. Total intra-service moderate Sedation Time: 66 minutes. The patient's level of consciousness and vital signs were monitored continuously by radiology nursing throughout the procedure under my direct supervision. CONTRAST:  145 mL Omnipaque 300 administered intra-arterially FLUOROSCOPY: Radiation Exposure Index (as provided by the fluoroscopic device): 66 minutes (862 mGy) COMPLICATIONS: None immediate. PROCEDURE: Informed consent was obtained from the patient following explanation of the procedure, risks, benefits and alternatives. The patient understands, agrees and consents for the procedure. All questions were addressed. A time out was performed prior to the initiation of the procedure. Maximal barrier sterile technique utilized including caps, mask, sterile gowns, sterile gloves, large sterile drape, hand hygiene, and Betadine prep. The patient was placed supine on the exam table. The left groin was prepped and draped in the standard sterile fashion. Ultrasound was used to evaluate the left common femoral artery, which found to be patent and suitable for access. An ultrasound image was permanently stored in the electronic medical record. Using ultrasound guidance, the left common femoral artery was directly punctured using a 21 gauge micropuncture set. An 018 wire was advanced centrally, followed by serial tract dilation and placement of a 5 French sheath. Using  a  combination of a C2 catheter and Glidewire, the celiac artery was catheterized. Angiography of the celiac artery was performed in multiple obliquities to study the anatomy, demonstrating expected branching anatomy with particular attention to the left gastric and splenic arteries based on expected location of hemorrhage on CTA imaging. No abnormal vessel branch was identified from the proximal splenic artery in the expected area of the short gastric arteries. There was suspicious appearance of a vessel cutoff/extravasation adjacent to an inferior branch of the left gastric artery. The decision was then made to proceed with selective catheterization of the left gastric artery. Using combination of a 2.8 [place] Progreat Omega microcatheter and fathom 16 microwire, the left gastric artery was selectively catheterized. Additional angiography of the left gastric artery demonstrated similar findings as seen before, with concern for vessel cutoff and active extravasation along a medial branch of the left gastric artery. Microcatheter and wire were then advanced further and the medial branch of the left gastric artery selectively catheterized. Additional angiography of this branch of the left gastric artery confirmed the presence of active gastrointestinal hemorrhage along the medial border of the stomach. This was consistent with findings on recent CTA. The decision was then made to proceed with embolization of this branch of the left gastric artery. Using a combination of 3 mm and packing Ruby coils, this branch of the left gastric artery and the more proximal left gastric artery were successfully embolized. Completion angiography of both the proximal left gastric artery and celiac artery demonstrated no further findings of active bleeding. At the end of the procedure, all catheters and wires were removed. Hemostasis was achieved at the access site using Angio-Seal vascular closure device after limited hand injection  demonstrated appropriate level of access and vessel caliber. Distal pulses were similar. A clean dressing was placed. The patient tolerated the procedure well without immediate complication. IMPRESSION: Mesenteric angiogram of the upper GI vessels demonstrated suspected bleed originating from a distal branch of the left gastric artery, consistent with location of bleed identified on CTA. The left gastric artery was successfully embolized using coils, with no further angiographic findings of continued bleeding at the end of the procedure. Electronically Signed   By: Olive Bass M.D.   On: 07/12/2022 16:46   IR Angiogram Selective Each Additional Vessel  Result Date: 07/12/2022 INDICATION: Upper GI bleeding, large volume hematemesis, hemorrhagic shock EXAM: 1. Ultrasound-guided puncture of the left common femoral artery 2. Catheterization and angiography of the celiac artery 3. Selective catheterization angiography of the left gastric artery 4. Selective catheterization angiography of a branch of the left gastric artery 5. Embolization of the left gastric artery MEDICATIONS: Documented in the EMR ANESTHESIA/SEDATION: Moderate (conscious) sedation was employed during this procedure. A total of Versed 2 mg and Fentanyl 100 mcg was administered intravenously by the radiology nurse. Total intra-service moderate Sedation Time: 66 minutes. The patient's level of consciousness and vital signs were monitored continuously by radiology nursing throughout the procedure under my direct supervision. CONTRAST:  145 mL Omnipaque 300 administered intra-arterially FLUOROSCOPY: Radiation Exposure Index (as provided by the fluoroscopic device): 66 minutes (862 mGy) COMPLICATIONS: None immediate. PROCEDURE: Informed consent was obtained from the patient following explanation of the procedure, risks, benefits and alternatives. The patient understands, agrees and consents for the procedure. All questions were addressed. A time out  was performed prior to the initiation of the procedure. Maximal barrier sterile technique utilized including caps, mask, sterile gowns, sterile gloves, large sterile drape, hand hygiene, and  Betadine prep. The patient was placed supine on the exam table. The left groin was prepped and draped in the standard sterile fashion. Ultrasound was used to evaluate the left common femoral artery, which found to be patent and suitable for access. An ultrasound image was permanently stored in the electronic medical record. Using ultrasound guidance, the left common femoral artery was directly punctured using a 21 gauge micropuncture set. An 018 wire was advanced centrally, followed by serial tract dilation and placement of a 5 French sheath. Using a combination of a C2 catheter and Glidewire, the celiac artery was catheterized. Angiography of the celiac artery was performed in multiple obliquities to study the anatomy, demonstrating expected branching anatomy with particular attention to the left gastric and splenic arteries based on expected location of hemorrhage on CTA imaging. No abnormal vessel branch was identified from the proximal splenic artery in the expected area of the short gastric arteries. There was suspicious appearance of a vessel cutoff/extravasation adjacent to an inferior branch of the left gastric artery. The decision was then made to proceed with selective catheterization of the left gastric artery. Using combination of a 2.8 [place] Progreat Omega microcatheter and fathom 16 microwire, the left gastric artery was selectively catheterized. Additional angiography of the left gastric artery demonstrated similar findings as seen before, with concern for vessel cutoff and active extravasation along a medial branch of the left gastric artery. Microcatheter and wire were then advanced further and the medial branch of the left gastric artery selectively catheterized. Additional angiography of this branch of the  left gastric artery confirmed the presence of active gastrointestinal hemorrhage along the medial border of the stomach. This was consistent with findings on recent CTA. The decision was then made to proceed with embolization of this branch of the left gastric artery. Using a combination of 3 mm and packing Ruby coils, this branch of the left gastric artery and the more proximal left gastric artery were successfully embolized. Completion angiography of both the proximal left gastric artery and celiac artery demonstrated no further findings of active bleeding. At the end of the procedure, all catheters and wires were removed. Hemostasis was achieved at the access site using Angio-Seal vascular closure device after limited hand injection demonstrated appropriate level of access and vessel caliber. Distal pulses were similar. A clean dressing was placed. The patient tolerated the procedure well without immediate complication. IMPRESSION: Mesenteric angiogram of the upper GI vessels demonstrated suspected bleed originating from a distal branch of the left gastric artery, consistent with location of bleed identified on CTA. The left gastric artery was successfully embolized using coils, with no further angiographic findings of continued bleeding at the end of the procedure. Electronically Signed   By: Olive BassYasser  El-Abd M.D.   On: 07/12/2022 16:46   IR EMBO ART  VEN HEMORR LYMPH EXTRAV  INC GUIDE ROADMAPPING  Result Date: 07/12/2022 INDICATION: Upper GI bleeding, large volume hematemesis, hemorrhagic shock EXAM: 1. Ultrasound-guided puncture of the left common femoral artery 2. Catheterization and angiography of the celiac artery 3. Selective catheterization angiography of the left gastric artery 4. Selective catheterization angiography of a branch of the left gastric artery 5. Embolization of the left gastric artery MEDICATIONS: Documented in the EMR ANESTHESIA/SEDATION: Moderate (conscious) sedation was employed  during this procedure. A total of Versed 2 mg and Fentanyl 100 mcg was administered intravenously by the radiology nurse. Total intra-service moderate Sedation Time: 66 minutes. The patient's level of consciousness and vital signs were monitored continuously by  radiology nursing throughout the procedure under my direct supervision. CONTRAST:  145 mL Omnipaque 300 administered intra-arterially FLUOROSCOPY: Radiation Exposure Index (as provided by the fluoroscopic device): 66 minutes (081 mGy) COMPLICATIONS: None immediate. PROCEDURE: Informed consent was obtained from the patient following explanation of the procedure, risks, benefits and alternatives. The patient understands, agrees and consents for the procedure. All questions were addressed. A time out was performed prior to the initiation of the procedure. Maximal barrier sterile technique utilized including caps, mask, sterile gowns, sterile gloves, large sterile drape, hand hygiene, and Betadine prep. The patient was placed supine on the exam table. The left groin was prepped and draped in the standard sterile fashion. Ultrasound was used to evaluate the left common femoral artery, which found to be patent and suitable for access. An ultrasound image was permanently stored in the electronic medical record. Using ultrasound guidance, the left common femoral artery was directly punctured using a 21 gauge micropuncture set. An 018 wire was advanced centrally, followed by serial tract dilation and placement of a 5 French sheath. Using a combination of a C2 catheter and Glidewire, the celiac artery was catheterized. Angiography of the celiac artery was performed in multiple obliquities to study the anatomy, demonstrating expected branching anatomy with particular attention to the left gastric and splenic arteries based on expected location of hemorrhage on CTA imaging. No abnormal vessel branch was identified from the proximal splenic artery in the expected area of  the short gastric arteries. There was suspicious appearance of a vessel cutoff/extravasation adjacent to an inferior branch of the left gastric artery. The decision was then made to proceed with selective catheterization of the left gastric artery. Using combination of a 2.8 [place] Progreat Omega microcatheter and fathom 16 microwire, the left gastric artery was selectively catheterized. Additional angiography of the left gastric artery demonstrated similar findings as seen before, with concern for vessel cutoff and active extravasation along a medial branch of the left gastric artery. Microcatheter and wire were then advanced further and the medial branch of the left gastric artery selectively catheterized. Additional angiography of this branch of the left gastric artery confirmed the presence of active gastrointestinal hemorrhage along the medial border of the stomach. This was consistent with findings on recent CTA. The decision was then made to proceed with embolization of this branch of the left gastric artery. Using a combination of 3 mm and packing Ruby coils, this branch of the left gastric artery and the more proximal left gastric artery were successfully embolized. Completion angiography of both the proximal left gastric artery and celiac artery demonstrated no further findings of active bleeding. At the end of the procedure, all catheters and wires were removed. Hemostasis was achieved at the access site using Angio-Seal vascular closure device after limited hand injection demonstrated appropriate level of access and vessel caliber. Distal pulses were similar. A clean dressing was placed. The patient tolerated the procedure well without immediate complication. IMPRESSION: Mesenteric angiogram of the upper GI vessels demonstrated suspected bleed originating from a distal branch of the left gastric artery, consistent with location of bleed identified on CTA. The left gastric artery was successfully  embolized using coils, with no further angiographic findings of continued bleeding at the end of the procedure. Electronically Signed   By: Albin Felling M.D.   On: 07/12/2022 16:46   CT ANGIO GI BLEED  Addendum Date: 07/12/2022   ADDENDUM REPORT: 07/12/2022 07:27 ADDENDUM: Critical Value/emergent results were called by telephone at the time of interpretation  on 07/12/2022 at 7:26 am to provider Dr. Judeth HornHunsucker, who verbally acknowledged these results. Electronically Signed   By: Signa Kellaylor  Stroud M.D.   On: 07/12/2022 07:27   Result Date: 07/12/2022 CLINICAL DATA:  Evaluate for GI bleed. EXAM: CTA ABDOMEN AND PELVIS WITHOUT AND WITH CONTRAST TECHNIQUE: Multidetector CT imaging of the abdomen and pelvis was performed using the standard protocol during bolus administration of intravenous contrast. Multiplanar reconstructed images and MIPs were obtained and reviewed to evaluate the vascular anatomy. RADIATION DOSE REDUCTION: This exam was performed according to the departmental dose-optimization program which includes automated exposure control, adjustment of the mA and/or kV according to patient size and/or use of iterative reconstruction technique. CONTRAST:  80mL OMNIPAQUE IOHEXOL 350 MG/ML SOLN COMPARISON:  07/11/2022 FINDINGS: VASCULAR Aorta: Aortic atherosclerosis. Normal caliber aorta without aneurysm, dissection, vasculitis or significant stenosis. Celiac: Patent without evidence of aneurysm, dissection, vasculitis or significant stenosis. SMA: Patent without evidence of aneurysm, dissection, vasculitis or significant stenosis. Renals: Both renal arteries are patent without evidence of aneurysm, dissection, vasculitis, fibromuscular dysplasia or significant stenosis. IMA: Patent Inflow: Patent without evidence of aneurysm, dissection, vasculitis or significant stenosis. Proximal Outflow: Bilateral common femoral and visualized portions of the superficial and profunda femoral arteries are patent without  evidence of aneurysm, dissection, vasculitis or significant stenosis. Veins: No obvious venous abnormality within the limitations of this arterial phase study. Bilateral central venous catheters are identified which inter from an inguinal approach Review of the MIP images confirms the above findings. NON-VASCULAR Lower chest: Small left pleural effusion. Subpleural consolidation is noted within both lung bases. Hepatobiliary: No focal liver abnormality. Liver has a cirrhotic appearance with a nodular contour. Partially decompressed gallbladder with small stones suspected. No bile duct dilatation. Pancreas: Unremarkable. No pancreatic ductal dilatation or surrounding inflammatory changes. Spleen: Normal in size without focal abnormality. Adrenals/Urinary Tract: Adrenal glands are unremarkable. Kidneys are normal, without renal calculi, focal lesion, or hydronephrosis. Bladder is unremarkable. Stomach/Bowel: There is an enteric tube in place with tip in the body2 of stomach. The stomach appears distended with debris as noted on the previous exam. High attenuation material is again noted within the stomach, small, and large bowel loops which is nonspecific and may represent intraluminal hematoma. This limits sensitivity for detecting underlying active GI bleeding. On the arterial phase images there is a small focus of active extravasation of IV contrast material into the lumen of the stomach, image 83/5. Review of the previous CT from prior day shows a similar focus of contrast extravasation. Edema involving the wall of the proximal duodenum is noted, image 74/5. No pathologic dilatation of the large or small bowel loops. Diffuse bowel wall edema is noted involving the colon. No signs of pneumatosis. Lymphatic: Prominent upper abdominal lymph nodes are noted, nonspecific in the setting of cirrhosis. Gastrohepatic ligament lymph node measures up to 1.2 cm, image 24/7. Aortocaval lymph node measures 1.1 cm, image 33/7.  Reproductive: Uterus appears unremarkable. Normal appearance of the right adnexa. Within the left adnexa there is a solid and cystic structure which measures 4.2 x 3.0 by 4.4 cm, image 78/10. Other: There is a moderate volume of abdominopelvic ascites. Musculoskeletal: No acute findings. Compression deformities are again seen involving T8, T9, L2 and L5. Unchanged from previous exam. Bilateral L5 pars defects identified. IMPRESSION: 1. There is a small focus of active intraluminal extravasation of IV contrast material from the posterior wall of the stomach compatible with active bleeding. Review of the previous CT from prior day shows a  similar focus of contrast extravasation. High attenuation material is identified within the small and large bowel loops which may represent sequelae of gastric bleeding. 2. Diffuse bowel wall edema involving the colon is again noted compatible with colitis. 3. Cirrhosis with moderate volume of abdominopelvic ascites. 4. Small left pleural effusion with subpleural consolidation within both lung bases. 5. Solid and cystic structure within the left adnexa is identified measuring up to 4.4 cm. Recommend further evaluation with nonemergent pelvic sonogram. 6.  Aortic Atherosclerosis (ICD10-I70.0). Electronically Signed: By: Signa Kell M.D. On: 07/12/2022 07:21   DG CHEST PORT 1 VIEW  Result Date: 07/12/2022 CLINICAL DATA:  Acute respiratory failure and hypoxia. EXAM: PORTABLE CHEST 1 VIEW COMPARISON:  07/10/2022 FINDINGS: ETT tip is stable above the carina. There is enteric tube with tip in the stomach. Stable cardiomediastinal contours. Small left pleural effusion. Atelectasis noted within both lung bases. Remote right clavicle fracture. IMPRESSION: 1. Stable support apparatus. 2. Small left pleural effusion. 3. Bibasilar atelectasis. Electronically Signed   By: Signa Kell M.D.   On: 07/12/2022 07:26   CT Angio Abd/Pel w/ and/or w/o  Result Date: 07/11/2022 CLINICAL  DATA:  Left upper quadrant pain, upper GI bleed, leukocytosis, cirrhosis EXAM: CTA ABDOMEN AND PELVIS WITHOUT AND WITH CONTRAST TECHNIQUE: Multidetector CT imaging of the abdomen and pelvis was performed using the standard protocol during bolus administration of intravenous contrast. Multiplanar reconstructed images and MIPs were obtained and reviewed to evaluate the vascular anatomy. RADIATION DOSE REDUCTION: This exam was performed according to the departmental dose-optimization program which includes automated exposure control, adjustment of the mA and/or kV according to patient size and/or use of iterative reconstruction technique. CONTRAST:  76mL OMNIPAQUE IOHEXOL 350 MG/ML SOLN COMPARISON:  01/13/2022 FINDINGS: Lower chest: Trace bilateral pleural effusions with bilateral lower lobe atelectasis. Hepatobiliary: Cirrhosis.  No focal hepatic lesion is seen. Gallbladder is decompressed with suspected cholelithiasis. No intrahepatic or extrahepatic duct dilatation. Pancreas: Mild peripancreatic fluid along the uncinate process (series 9/image 39). This is equivocal and may be related to surrounding ascites, but mild pancreatitis is possible. No walled-off necrosis. Spleen: Within normal limits. Adrenals/Urinary Tract: Adrenal glands are within normal limits. Kidneys are within normal limits.  No hydronephrosis. Bladder is within normal limits. Stomach/Bowel: Distended stomach with debris, some of which is hyperdense and could reflect a hematoma (series 2/image 25). Enteric tube terminates in the distal gastric body. No evidence of bowel obstruction. Appendix is not discretely visualized. Wall thickening involving the transverse, descending, and rectosigmoid colon, suggesting infectious/inflammatory colitis. Hyperdense luminal colonic contents on unenhanced CT, nonspecific and mildly limiting evaluation. Following contrast administration, there is no contrast extravasation into the lumen on arterial or delayed  phases to suggest active GI bleeding. Vascular/Lymphatic: No evidence of abdominal aortic aneurysm. Atherosclerotic calcifications of the abdominal aorta and branch vessels. Portal vein is patent. No suspicious abdominopelvic lymphadenopathy. Reproductive: Uterus and bilateral ovaries are within normal limits. Other: Moderate abdominopelvic ascites.  No free air. Musculoskeletal: Degenerative changes of the visualized thoracolumbar spine. Moderate central compression fracture deformities at T8-9 (sagittal image 70), chronic. Moderate superior endplate compression fracture deformity at L2 (sagittal image 81, chronic. Mild superior endplate compression fracture deformity at L5, chronic. 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 Review of the MIP images confirms the above findings. IMPRESSION: No findings suspicious for active GI bleeding. Distended stomach with suspected intraluminal hematoma. Consider upper endoscopy for further evaluation/direct inspection. Wall thickening involving the transverse, descending, and rectosigmoid colon, suggesting infectious/inflammatory colitis. Cirrhosis.  Moderate abdominopelvic  ascites. Additional ancillary findings as above. Electronically Signed   By: Charline Bills M.D.   On: 07/11/2022 01:58   DG CHEST PORT 1 VIEW  Result Date: 07/10/2022 CLINICAL DATA:  Endotracheal tube and central line placement. EXAM: PORTABLE CHEST 1 VIEW COMPARISON:  07/10/2022. FINDINGS: The heart size and mediastinal contours are stable. There is atherosclerotic calcification of the aorta. Lung volumes are low. Interstitial prominence is noted bilaterally. No effusion or pneumothorax. The endotracheal tube terminates 3.4 cm above the carina. An enteric tube courses over the mid abdomen and out of the field of view. No central venous catheter is seen. No acute osseous abnormality. IMPRESSION: 1. Stable chest with no acute process. 2. Endotracheal and enteric tubes as described above. 3. Central  line is not visualized on exam. Clinical correlation is recommended. Electronically Signed   By: Thornell Sartorius M.D.   On: 07/10/2022 23:18   DG Chest Portable 1 View  Result Date: 07/10/2022 CLINICAL DATA:  Hypoxia and shortness of breath EXAM: PORTABLE CHEST 1 VIEW COMPARISON:  Chest x-ray 07/05/2022 FINDINGS: The cardiomediastinal silhouette is within normal limits. There is no focal lung infiltrate, pleural effusion or pneumothorax. No acute fractures are seen. IMPRESSION: No active disease. Electronically Signed   By: Darliss Cheney M.D.   On: 07/10/2022 18:15   DG Chest Port 1 View  Result Date: 07/05/2022 CLINICAL DATA:  Unstable GI bleed EXAM: PORTABLE CHEST 1 VIEW COMPARISON:  01/12/2022 FINDINGS: Lungs are essentially clear.  No pleural effusion or pneumothorax. The heart is normal in size. IMPRESSION: No evidence of acute cardiopulmonary disease. Electronically Signed   By: Charline Bills M.D.   On: 07/05/2022 21:24    Microbiology: Results for orders placed or performed during the hospital encounter of 07/05/22  Culture, blood (Routine x 2)     Status: None   Collection Time: 07/05/22  8:56 PM   Specimen: BLOOD RIGHT FOREARM  Result Value Ref Range Status   Specimen Description   Final    BLOOD RIGHT FOREARM Performed at [facility], [address], [place], [place] [address]    Special Requests   Final    BOTTLES DRAWN AEROBIC AND ANAEROBIC Blood Culture results may not be optimal due to an inadequate volume of blood received in culture bottles Performed at [facility], [address], [place], [place] [address]    Culture   Final    NO GROWTH 5 DAYS Performed at [facility], [address], [place], [place] [address]    Report Status 07/10/2022 FINAL  Final  Resp panel by RT-PCR (RSV, Flu A&B, Covid) Anterior Nasal Swab     Status: None   Collection Time: 07/06/22 12:59 AM   Specimen: Anterior Nasal Swab  Result Value Ref Range Status    SARS Coronavirus 2 by RT PCR NEGATIVE NEGATIVE Final    Comment: (NOTE) SARS-CoV-2 target nucleic acids are NOT DETECTED.  The SARS-CoV-2 RNA is generally detectable in upper respiratory specimens during the acute phase of infection. The lowest concentration of SARS-CoV-2 viral copies this assay can detect is 138 copies/mL. A negative result does not preclude SARS-Cov-2 infection and should not be used as the sole basis for treatment or other patient management decisions. A negative result may occur with  improper specimen collection/handling, submission of specimen other than nasopharyngeal swab, presence of viral mutation(s) within the areas targeted by this assay, and inadequate number of viral copies(<138 copies/mL). A negative result must be  combined with clinical observations, patient history, and epidemiological information. The expected result is Negative.  Fact Sheet for Patients:  BloggerCourse.com  Fact Sheet for Healthcare Providers:  SeriousBroker.it  This test is no t yet approved or cleared by the [place] FDA and  has been authorized for detection and/or diagnosis of SARS-CoV-2 by FDA under an Emergency Use Authorization (EUA). This EUA will remain  in effect (meaning this test can be used) for the duration of the COVID-19 declaration under Section 564(b)(1) of the Act, 21 U.S.C.section 360bbb-3(b)(1), unless the authorization is terminated  or revoked sooner.       Influenza A by PCR NEGATIVE NEGATIVE Final   Influenza B by PCR NEGATIVE NEGATIVE Final    Comment: (NOTE) The Xpert Xpress SARS-CoV-2/FLU/RSV plus assay is intended as an aid in the diagnosis of influenza from Nasopharyngeal swab specimens and should not be used as a sole basis for treatment. Nasal washings and aspirates are unacceptable for Xpert Xpress SARS-CoV-2/FLU/RSV testing.  Fact Sheet for  Patients: BloggerCourse.com  Fact Sheet for Healthcare Providers: SeriousBroker.it  This test is not yet approved or cleared by the [place] FDA and has been authorized for detection and/or diagnosis of SARS-CoV-2 by FDA under an Emergency Use Authorization (EUA). This EUA will remain in effect (meaning this test can be used) for the duration of the COVID-19 declaration under Section 564(b)(1) of the Act, 21 U.S.C. section 360bbb-3(b)(1), unless the authorization is terminated or revoked.     Resp Syncytial Virus by PCR NEGATIVE NEGATIVE Final    Comment: (NOTE) Fact Sheet for Patients: BloggerCourse.com  Fact Sheet for Healthcare Providers: SeriousBroker.it  This test is not yet approved or cleared by the [place] FDA and has been authorized for detection and/or diagnosis of SARS-CoV-2 by FDA under an Emergency Use Authorization (EUA). This EUA will remain in effect (meaning this test can be used) for the duration of the COVID-19 declaration under Section 564(b)(1) of the Act, 21 U.S.C. section 360bbb-3(b)(1), unless the authorization is terminated or revoked.  Performed at [facility], [address], [place], [place] [address]   MRSA Next Gen by PCR, Nasal     Status: None   Collection Time: 07/06/22 12:59 AM   Specimen: Anterior Nasal Swab  Result Value Ref Range Status   MRSA by PCR Next Gen NOT DETECTED NOT DETECTED Final    Comment: (NOTE) The GeneXpert MRSA Assay (FDA approved for NASAL specimens only), is one component of a comprehensive MRSA colonization surveillance program. It is not intended to diagnose MRSA infection nor to guide or monitor treatment for MRSA infections. Test performance is not FDA approved in patients less than 82 years old. Performed at [facility], [address] [place], [place] [address]    Culture, blood (Routine x 2)     Status: None   Collection Time: 07/06/22  3:19 AM   Specimen: BLOOD RIGHT ARM  Result Value Ref Range Status   Specimen Description   Final    BLOOD RIGHT ARM Performed at [facility], [address], [place], [place] [address]    Special Requests   Final    BOTTLES DRAWN AEROBIC ONLY Blood Culture adequate volume Performed at [facility], [address], [place], [place] [address]    Culture   Final    NO GROWTH 5 DAYS Performed at [facility], [address] [place], [place] [address]    Report Status  07/11/2022 FINAL  Final    Labs: CBC: Recent Labs  Lab 07/15/22 0306 07/15/22 1712 07/17/22 1016 07/18/22 0329 07/19/22 0726 07/20/22 0707 07/21/22 0742  WBC 9.5   < > 6.9 8.7 6.7 5.8 5.9  NEUTROABS 7.0  --   --   --   --   --   --   HGB 8.0*   < > 9.7* 9.1* 9.2* 9.4* 9.1*  HCT 25.8*   < > 31.4* 29.7* 29.5* 30.3* 28.6*  MCV 101.6*   < > 102.3* 103.8* 101.4* 100.7* 100.4*  PLT 99*   < > 68* 143* 150 144* 151   < > = values in this interval not displayed.   Basic Metabolic Panel: Recent Labs  Lab 07/16/22 0150 07/17/22 1016 07/18/22 0329 07/19/22 0726 07/20/22 0707 07/20/22 1757 07/21/22 0742  NA 143 142 139 139 135  --  133*  K 3.6 3.5 3.2* 3.7 3.9  --  3.7  CL 115* 109 110 109 104  --  103  CO2 22 25 25 26 26   --  23  GLUCOSE 108* 114* 101* 96 93  --  90  BUN 18 10 8  7* 8  --  9  CREATININE 0.64 0.57 0.48 0.41* 0.49  --  0.60  CALCIUM 8.1* 8.8* 8.0* 8.2* 8.3*  --  8.1*  MG 1.7 1.9  --   --   --  2.2  --    Liver Function Tests: Recent Labs  Lab 07/18/22 0329 07/19/22 0726 07/20/22 0707 07/21/22 0742  AST 58* 54* 52* 53*  ALT 48* 44 40 37  ALKPHOS 238* 272* 301* 340*  BILITOT 1.5* 1.8* 2.1* 2.0*  PROT 5.8* 5.9* 6.2* 6.0*  ALBUMIN 2.8* 3.1* 3.2* 2.9*   CBG: Recent Labs  Lab 07/16/22 1200 07/16/22 1555 07/16/22 1935 07/16/22 2349 07/17/22 0404  GLUCAP 102* 97 94 129* 114*     Discharge time spent: less than 30 minutes.  Signed: Rickey Barbara, MD [facility] 07/21/2022

## 2022-07-21 NOTE — Progress Notes (Signed)
Physical Therapy Treatment Patient Details Name: Hannah Blake MRN: [id number] DOB: 11/19/1950 Today's Date: 07/21/2022   History of Present Illness 72 y/o F who presented to [facility] ER 12/21 with reports of bloody stools.   She was admitted with shock in the setting of GIB. Intubated 12/21-12/27/23. The patient was also admitted from 12/16-12/20/23 for hematemesis. PMH: Bell's Palsy, cirrhosis, CAD,  heart failure.    PT Comments    Pt making excellent progress with goals met and updated.  Also, updated recommendation to home with intermittent support and HHPT.  Pt able to transfer with supervision and ambulated 150' with RW and min guard progressing to supervision. VSS during session.  Continue to progress as able.     Recommendations for follow up therapy are one component of a multi-disciplinary discharge planning process, led by the attending physician.  Recommendations may be updated based on patient status, additional functional criteria and insurance authorization.  Follow Up Recommendations  Home health PT Can patient physically be transported by private vehicle: Yes   Assistance Recommended at Discharge Intermittent Supervision/Assistance  Patient can return home with the following A little help with walking and/or transfers;A little help with bathing/dressing/bathroom;Assistance with cooking/housework;Help with stairs or ramp for entrance   Equipment Recommendations  None recommended by PT    Recommendations for Other Services       Precautions / Restrictions Precautions Precautions: Fall     Mobility  Bed Mobility Overal bed mobility: Needs Assistance Bed Mobility: Supine to Sit, Sit to Supine     Supine to sit: Supervision Sit to supine: Supervision        Transfers Overall transfer level: Needs assistance Equipment used: Rolling walker (2 wheels) Transfers: Sit to/from Stand Sit to Stand: Min guard           General transfer comment: Min guard for safety;  cues for RW    Ambulation/Gait Ambulation/Gait assistance: Min guard, Supervision Gait Distance (Feet): 150 Feet Assistive device: Rolling walker (2 wheels) Gait Pattern/deviations: Step-through pattern Gait velocity: decreased     General Gait Details: Min guard progressing to close supervision; occasional assist for RW in room due to impaired vision   Stairs             Wheelchair Mobility    Modified Rankin (Stroke Patients Only)       Balance Overall balance assessment: History of Falls Sitting-balance support: No upper extremity supported, Feet supported Sitting balance-Leahy Scale: Good     Standing balance support: No upper extremity supported, Bilateral upper extremity supported Standing balance-Leahy Scale: Fair Standing balance comment: RW to ambulate in hallway but taking a few steps wtihout RW                            Cognition Arousal/Alertness: Awake/alert Behavior During Therapy: WFL for tasks assessed/performed Overall Cognitive Status: Within Functional Limits for tasks assessed                                 General Comments: Able to follow instructions, make jokes, answer questions        Exercises      General Comments General comments (skin integrity, edema, etc.): VSS      Pertinent Vitals/Pain Pain Assessment Pain Assessment: No/denies pain    Home Living                            Prior Function            PT Goals (current goals can now be found in the care plan section) Acute Rehab PT Goals PT Goal Formulation: With patient Time For Goal Achievement: 08/04/22 Potential to Achieve Goals: Good Progress towards PT goals: Goals met and updated - see care plan    Frequency    Min 3X/week      PT Plan Discharge plan needs to be updated;Frequency needs to be updated    Co-evaluation              AM-PAC PT "6 Clicks" Mobility   Outcome Measure  Help needed turning from  your back to your side while in a flat bed without using bedrails?: None Help needed moving from lying on your back to sitting on the side of a flat bed without using bedrails?: None Help needed moving to and from a bed to a chair (including a wheelchair)?: A Little Help needed standing up from a chair using your arms (e.g., wheelchair or bedside chair)?: A Little Help needed to walk in hospital room?: A Little Help needed climbing 3-5 steps with a railing? : A Little 6 Click Score: 20    End of Session Equipment Utilized During Treatment: Gait belt Activity Tolerance: Patient tolerated treatment well Patient left: in bed;with call bell/phone within reach;with bed alarm set Nurse Communication: Mobility status PT Visit Diagnosis: Unsteadiness on feet (R26.81);Muscle weakness (generalized) (M62.81);Difficulty in walking, not elsewhere classified (R26.2);History of falling (Z91.81)     Time: 1139-1200 PT Time Calculation (min) (ACUTE ONLY): 21 min  Charges:  $Gait Training: 8-22 mins                     Dacia, PT Acute Rehab Services [facility] [phone]    Dacia H Benton 07/21/2022, 12:46 PM  

## 2022-07-21 NOTE — Progress Notes (Signed)
Subjective: No blood in stool. No abdominal pain.  Objective: Vital signs in last 24 hours: Temp:  [98.3 F (36.8 C)-99.2 F (37.3 C)] 98.3 F (36.8 C) (01/01 1323) Pulse Rate:  [65-76] 65 (01/01 1323) Resp:  [12-18] 12 (01/01 1323) BP: (108-119)/(53-66) 119/66 (01/01 1323) SpO2:  [95 %-97 %] 97 % (01/01 1323) Weight:  [46.9 kg] 46.9 kg (01/01 0527) Weight change: -1 kg Last BM Date : 07/20/22  PE: GEN:  Thin, cachectic, elderly, NAD ABD:  Soft, non-tender  Lab Results: CBC    Component Value Date/Time   WBC 5.9 07/21/2022 0742   RBC 2.85 (L) 07/21/2022 0742   HGB 9.1 (L) 07/21/2022 0742   HGB 8.0 (L) 05/27/2021 1504   HCT 28.6 (L) 07/21/2022 0742   HCT 22.7 (L) 05/27/2021 1504   PLT 151 07/21/2022 0742   PLT 221 05/27/2021 1504   MCV 100.4 (H) 07/21/2022 0742   MCV 88 05/27/2021 1504   MCH 31.9 07/21/2022 0742   MCHC 31.8 07/21/2022 0742   RDW 22.2 (H) 07/21/2022 0742   RDW 15.8 (H) 05/27/2021 1504   LYMPHSABS 1.3 07/15/2022 0306   LYMPHSABS 2.5 05/27/2021 1504   MONOABS 0.7 07/15/2022 0306   EOSABS 0.3 07/15/2022 0306   EOSABS 0.3 05/27/2021 1504   BASOSABS 0.0 07/15/2022 0306   BASOSABS 0.1 05/27/2021 1504  CMP     Component Value Date/Time   NA 133 (L) 07/21/2022 0742   NA 132 (L) 05/27/2021 1504   K 3.7 07/21/2022 0742   CL 103 07/21/2022 0742   CO2 23 07/21/2022 0742   GLUCOSE 90 07/21/2022 0742   BUN 9 07/21/2022 0742   BUN 20 05/27/2021 1504   CREATININE 0.60 07/21/2022 0742   CALCIUM 8.1 (L) 07/21/2022 0742   PROT 6.0 (L) 07/21/2022 0742   PROT 8.0 05/27/2021 1504   ALBUMIN 2.9 (L) 07/21/2022 0742   ALBUMIN 2.4 (L) 05/27/2021 1504   AST 53 (H) 07/21/2022 0742   ALT 37 07/21/2022 0742   ALKPHOS 340 (H) 07/21/2022 0742   BILITOT 2.0 (H) 07/21/2022 0742   BILITOT 5.2 (H) 05/27/2021 1504   GFRNONAA >60 07/21/2022 0742   Assessment:   Cirrhosis with primary biliary cholangitis. Recurrent upper GI bleeding from gastric ulcer.  Endoscopy  today no active bleeding. Acute blood loss anemia.  Plan:   Pantoprazole 40 mg po bid upon discharge. Advance diet as tolerated. Follow-up outpatient Dr. Alessandra Bevels. [name] will sign-off; please call with questions; thank you for the consultation.   Hannah Blake 07/21/2022, 1:27 PM   Cell [phone] If no answer or after 5 PM call [phone]

## 2022-07-21 NOTE — Progress Notes (Signed)
  Daily Progress Note   Patient Name: Hannah Blake       Date: 07/21/2022 DOB: 06-03-1951  Age: 72 y.o. MRN#: [id number] Attending Physician: Donne Hazel, MD Primary Care Physician: Johny Blamer, DO Admit Date: 07/10/2022 Length of Stay: 11 days  Patient last seen by palliative provider Dr. Rowe Pavy on 07/19/22. At that time, patient and family's goals for care remained focused on continuing to full scope of treatment. Patient's medical status continuing to slowly improve and PT now stating patient can return home with home health instead of SNF. Will place [facility] consult to assist in coordination of home palliative medicine to continue discussions supporting care conversations. As goals for medical care are currently determined, palliative care team will sign off. Please reach out if our team can be of further assistance in the future. Thank you for involving our team in patient's care.    Chelsea Aus, DO Palliative Care Provider PMT # [phone]

## 2022-07-21 NOTE — Plan of Care (Signed)
  Problem: Education: Goal: Knowledge of General Education information will improve Description: Including pain rating scale, medication(s)/side effects and non-pharmacologic comfort measures Outcome: Progressing   Problem: Health Behavior/Discharge Planning: Goal: Ability to manage health-related needs will improve Outcome: Progressing   Problem: Clinical Measurements: Goal: Ability to maintain clinical measurements within normal limits will improve Outcome: Progressing Goal: Will remain free from infection Outcome: Progressing Goal: Diagnostic test results will improve Outcome: Progressing Goal: Respiratory complications will improve Outcome: Progressing Goal: Cardiovascular complication will be avoided Outcome: Progressing   Problem: Activity: Goal: Risk for activity intolerance will decrease Outcome: Progressing   Problem: Nutrition: Goal: Adequate nutrition will be maintained Outcome: Progressing   Problem: Coping: Goal: Level of anxiety will decrease Outcome: Progressing   Problem: Elimination: Goal: Will not experience complications related to bowel motility Outcome: Progressing Goal: Will not experience complications related to urinary retention Outcome: Progressing   Problem: Pain Managment: Goal: General experience of comfort will improve Outcome: Progressing   Problem: Safety: Goal: Ability to remain free from injury will improve Outcome: Progressing   Problem: Skin Integrity: Goal: Risk for impaired skin integrity will decrease Outcome: Progressing   Problem: Education: Goal: Ability to identify signs and symptoms of gastrointestinal bleeding will improve Outcome: Progressing   Problem: Bowel/Gastric: Goal: Will show no signs and symptoms of gastrointestinal bleeding Outcome: Progressing   Problem: Fluid Volume: Goal: Will show no signs and symptoms of excessive bleeding Outcome: Progressing   Problem: Clinical Measurements: Goal:  Complications related to the disease process, condition or treatment will be avoided or minimized Outcome: Progressing   Problem: Education: Goal: Understanding of CV disease, CV risk reduction, and recovery process will improve Outcome: Progressing Goal: Individualized Educational Video(s) Outcome: Progressing   Problem: Activity: Goal: Ability to return to baseline activity level will improve Outcome: Progressing   Problem: Cardiovascular: Goal: Ability to achieve and maintain adequate cardiovascular perfusion will improve Outcome: Progressing Goal: Vascular access site(s) Level 0-1 will be maintained Outcome: Progressing   Problem: Health Behavior/Discharge Planning: Goal: Ability to safely manage health-related needs after discharge will improve Outcome: Progressing   Problem: Safety: Goal: Non-violent Restraint(s) Outcome: Progressing

## 2022-07-22 ENCOUNTER — Other Ambulatory Visit (HOSPITAL_COMMUNITY): Payer: Self-pay

## 2022-07-22 ENCOUNTER — Telehealth: Payer: Self-pay

## 2022-07-22 NOTE — Telephone Encounter (Signed)
Transition Care Management Follow-up Telephone Call Date of discharge and from where: [facility] 07/21/2022 How have you been since you were released from the hospital? better Any questions or concerns? No  Items Reviewed: Did the pt receive and understand the discharge instructions provided? Yes  Medications obtained and verified? Yes  Other? No  Any new allergies since your discharge? No  Dietary orders reviewed? Yes Do you have support at home? Yes   Home Care and Equipment/Supplies: Were home health services ordered? yes If so, what is the name of the agency? [facility]  Has the agency set up a time to come to the patient's home? no Were any new equipment or medical supplies ordered?  No What is the name of the medical supply agency? N/a Were you able to get the supplies/equipment? not applicable Do you have any questions related to the use of the equipment or supplies? No  Functional Questionnaire: (I = Independent and D = Dependent) ADLs: I  Bathing/Dressing- I  Meal Prep- I  Eating- I  Maintaining continence- I  Transferring - I  Managing Meds- I  Follow up appointments reviewed:  PCP Hospital f/u appt confirmed? No  Patient's grandson will call [facility] f/u appt confirmed? No  Patient's grandson will call Are transportation arrangements needed? No  If their condition worsens, is the pt aware to call PCP or go to the Emergency Dept.? Yes Was the patient provided with contact information for the PCP's office or ED? Yes Was to pt encouraged to call back with questions or concerns? Yes  Juanda Crumble, LPN [place] Direct Dial [phone]

## 2022-07-28 ENCOUNTER — Telehealth: Payer: Self-pay | Admitting: *Deleted

## 2022-07-28 NOTE — Telephone Encounter (Signed)
RTC to [facility] given Verbal Okay for PT services per Dr. Nikki Dom.

## 2022-07-28 NOTE — Telephone Encounter (Signed)
Call from [place] needs verbal orders for PT 2 times a week for 5 weeks.  Patient recently discharged.. Needs for Strengthening, balance and Gait training .  Roselie Awkward can be reached at [phone].

## 2022-08-11 ENCOUNTER — Encounter: Payer: Self-pay | Admitting: Student

## 2022-08-11 ENCOUNTER — Other Ambulatory Visit: Payer: Self-pay | Admitting: Student

## 2022-08-11 ENCOUNTER — Encounter: Payer: Self-pay | Admitting: Internal Medicine

## 2022-08-11 ENCOUNTER — Other Ambulatory Visit (HOSPITAL_COMMUNITY): Payer: Self-pay

## 2022-08-11 ENCOUNTER — Ambulatory Visit (INDEPENDENT_AMBULATORY_CARE_PROVIDER_SITE_OTHER): Payer: Self-pay | Admitting: Internal Medicine

## 2022-08-11 ENCOUNTER — Other Ambulatory Visit: Payer: Self-pay

## 2022-08-11 VITALS — BP 125/64 | HR 79 | Temp 97.6°F | Wt 98.0 lb

## 2022-08-11 DIAGNOSIS — D500 Iron deficiency anemia secondary to blood loss (chronic): Secondary | ICD-10-CM

## 2022-08-11 DIAGNOSIS — F1011 Alcohol abuse, in remission: Secondary | ICD-10-CM

## 2022-08-11 DIAGNOSIS — I8510 Secondary esophageal varices without bleeding: Secondary | ICD-10-CM

## 2022-08-11 DIAGNOSIS — K7460 Unspecified cirrhosis of liver: Secondary | ICD-10-CM

## 2022-08-11 DIAGNOSIS — R188 Other ascites: Secondary | ICD-10-CM

## 2022-08-11 DIAGNOSIS — F17210 Nicotine dependence, cigarettes, uncomplicated: Secondary | ICD-10-CM

## 2022-08-11 DIAGNOSIS — K922 Gastrointestinal hemorrhage, unspecified: Secondary | ICD-10-CM

## 2022-08-11 DIAGNOSIS — I5020 Unspecified systolic (congestive) heart failure: Secondary | ICD-10-CM

## 2022-08-11 DIAGNOSIS — K743 Primary biliary cirrhosis: Secondary | ICD-10-CM

## 2022-08-11 DIAGNOSIS — K7031 Alcoholic cirrhosis of liver with ascites: Secondary | ICD-10-CM

## 2022-08-11 HISTORY — DX: Alcohol abuse, in remission: F10.11

## 2022-08-11 MED ORDER — CARVEDILOL 3.125 MG PO TABS
3.1250 mg | ORAL_TABLET | Freq: Two times a day (BID) | ORAL | 11 refills | Status: DC
  Filled 2022-08-11: qty 60, 30d supply, fill #0
  Filled 2022-09-11: qty 60, 30d supply, fill #1

## 2022-08-11 MED ORDER — URSODIOL 300 MG PO CAPS
600.0000 mg | ORAL_CAPSULE | Freq: Two times a day (BID) | ORAL | 3 refills | Status: DC
  Filled 2022-08-11 (×2): qty 120, 30d supply, fill #0

## 2022-08-11 MED ORDER — PANTOPRAZOLE SODIUM 40 MG PO TBEC
40.0000 mg | DELAYED_RELEASE_TABLET | Freq: Two times a day (BID) | ORAL | 0 refills | Status: DC
  Filled 2022-08-11 – 2022-09-02 (×2): qty 60, 30d supply, fill #0

## 2022-08-11 NOTE — Assessment & Plan Note (Signed)
Seen for hospital follow up. Patient reports she is feeling better. Spirit and mood are doing well. She has not noticed any further bleeding. Denies any dizziness, lightheadedness, or feeling weak/fatigued/SHOB.   Will obtain CBC to monitor hgb. Not obtained during lab draw today. Will have patient return for lab only visit.

## 2022-08-11 NOTE — Assessment & Plan Note (Signed)
Does not feel dyspneic. No complaints of weakness.  No evidence of volume overload at this time.  Patient with hx of HFrEF. She would benefit from initiation of GDMT. Was hypotensive during recent hospitalization and was discharged with midodrine for BP support. Starting coreg low dose for hx varices which will benefit her heart failure as well. Will plan to initiate additional GDMT, likely Entresto and/or Arlyce Harman, at future visits as BP tolerates.

## 2022-08-11 NOTE — Assessment & Plan Note (Addendum)
Will plan to start patient on PO iron. May require bowel regimen as well.

## 2022-08-11 NOTE — Assessment & Plan Note (Signed)
Son does report that they have several weeks supply at home from hospital, but have not been able to fill refill due to price.   Will recheck CMP today. Good Rx coupon provided for ursodiol Rx.

## 2022-08-11 NOTE — Assessment & Plan Note (Addendum)
" >>  ASSESSMENT AND PLAN FOR DECOMPENSATED HEPATIC CIRRHOSIS (HCC) WRITTEN ON 11/25/2023  7:21 AM BY FERNAND PROST, MD   >>ASSESSMENT AND PLAN FOR HEPATIC CIRRHOSIS (HCC) WRITTEN ON 08/11/2022  2:14 PM BY GRIFFITH PORTER, MD  Concern for PBC . Patient was prescribed ursodiol , but she has not started this medication since she is self pay and Rx would be too expensive. She was supposed to follow up with Dr. Elicia after discharge, but has not seen gastroenterology since discharge from hospital either due to insurance issues as well.   No evidence of decompensation at this time. Abd is not tender. Minimal distention.  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 She has not drank alcohol in some time now. She needs to establish with GI, but is limited by lack of insurance. Currently working on orange card paperwork. Will refer to case management for further assistance. Once she has insurance, will plan to refer to GI.  Check CMP today   >>ASSESSMENT AND PLAN FOR ESOPHAGEAL VARICES IN ALCOHOLIC CIRRHOSIS (HCC) WRITTEN ON 08/11/2022  1:47 PM BY GRIFFITH PORTER, MD  No hematemesis recently.  Patient with hx of cirrhosis and 2 large esophageal varices which were banded  6/23 after hematemesis. No recurrence of varices seen on recent scope. She is at risk for recurrence. Given her recent hospitalization, will start on prophylactic BB low dose 3.125 BID.    >>ASSESSMENT AND PLAN FOR PRIMARY BILIARY CHOLANGITIS (HCC) WRITTEN ON 08/11/2022  2:14 PM BY GRIFFITH PORTER, MD  Son does report that they have several weeks supply at home from hospital, but have not been able to fill refill due to price.   Will recheck CMP today. Good Rx coupon provided for ursodiol  Rx. "

## 2022-08-11 NOTE — Progress Notes (Signed)
   CC: HFU  HPI:[name] is a 72 y.o. female who presents for evaluation of GIB. Please see individual problem based A/P for details.  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  Past Medical History:  Diagnosis Date   Bell's palsy    Cirrhosis ([place])    Coronary artery disease    Heart failure with reduced ejection fraction ([place])    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([place])    Review of Systems:   See HPI   Physical Exam: Vitals:   08/11/22 0951 08/11/22 0953  BP:  125/64  Pulse:  79  Temp:  97.6 F (36.4 C)  TempSrc:  Oral  SpO2:  100%  Weight: 98 lb (44.5 kg)    General: NAD, chronically ill appearing Cardiovascular: Normal rate, regular rhythm.  No murmurs, rubs, or gallops Pulmonary : Equal breath sounds, No wheezes, rales, or rhonchi Abdominal: soft, nontender,  bowel sounds present, minimal distention Ext: No edema in lower extremities, no tenderness to palpation of lower extremities.   Assessment & Plan:   See Encounters Tab for problem based charting.  Patient discussed with Dr. Angelia Mould

## 2022-08-11 NOTE — Assessment & Plan Note (Addendum)
No hematemesis recently.  Patient with hx of cirrhosis and 2 large esophageal varices which were banded  6/23 after hematemesis. No recurrence of varices seen on recent scope. She is at risk for recurrence. Given her recent hospitalization, will start on prophylactic BB low dose 3.125 BID.

## 2022-08-11 NOTE — Patient Instructions (Signed)
Dear Mrs. Creer,  Thank you for trusting [place] with your care today.  We discussed your recent hospitalization. For your GI issues, please take the ursodiol. I have also started a medication called carvedilol. This helps prevent the distended veins in your esophagus from bleeding.  I have sent in a referral to case management to assist with insurance. Please also work on completing the orange card paperwork so that we can refer you to the gastroenterologist.  We would like to check some blood work on you today.  Please follow up in 1 month.

## 2022-08-11 NOTE — Assessment & Plan Note (Addendum)
No longer drinking.  Not able to take naltrexone 2/2 cirrhosis, but likely des not require medication since she has been abstinent without treatment.

## 2022-08-12 ENCOUNTER — Encounter: Payer: Self-pay | Admitting: Internal Medicine

## 2022-08-12 ENCOUNTER — Other Ambulatory Visit: Payer: Self-pay

## 2022-08-12 LAB — CMP14 + ANION GAP
ALT: 20 IU/L (ref 0–32)
AST: 45 IU/L — ABNORMAL HIGH (ref 0–40)
Albumin/Globulin Ratio: 0.9 — ABNORMAL LOW (ref 1.2–2.2)
Albumin: 3.7 g/dL — ABNORMAL LOW (ref 3.8–4.8)
Alkaline Phosphatase: 491 IU/L — ABNORMAL HIGH (ref 44–121)
Anion Gap: 17 mmol/L (ref 10.0–18.0)
BUN/Creatinine Ratio: 25 (ref 12–28)
BUN: 15 mg/dL (ref 8–27)
Bilirubin Total: 1.9 mg/dL — ABNORMAL HIGH (ref 0.0–1.2)
CO2: 20 mmol/L (ref 20–29)
Calcium: 9.2 mg/dL (ref 8.7–10.3)
Chloride: 101 mmol/L (ref 96–106)
Creatinine, Ser: 0.6 mg/dL (ref 0.57–1.00)
Globulin, Total: 4 g/dL (ref 1.5–4.5)
Glucose: 83 mg/dL (ref 70–99)
Potassium: 4.4 mmol/L (ref 3.5–5.2)
Sodium: 138 mmol/L (ref 134–144)
Total Protein: 7.7 g/dL (ref 6.0–8.5)
eGFR: 95 mL/min/{1.73_m2} (ref >59)

## 2022-08-15 ENCOUNTER — Other Ambulatory Visit (INDEPENDENT_AMBULATORY_CARE_PROVIDER_SITE_OTHER): Payer: Self-pay

## 2022-08-15 DIAGNOSIS — K7030 Alcoholic cirrhosis of liver without ascites: Secondary | ICD-10-CM

## 2022-08-15 DIAGNOSIS — I8510 Secondary esophageal varices without bleeding: Secondary | ICD-10-CM

## 2022-08-16 LAB — CBC WITH DIFFERENTIAL/PLATELET
Basophils Absolute: 0 10*3/uL (ref 0.0–0.2)
Basos: 0 %
EOS (ABSOLUTE): 0.5 10*3/uL — ABNORMAL HIGH (ref 0.0–0.4)
Eos: 10 %
Hematocrit: 28.8 % — ABNORMAL LOW (ref 34.0–46.6)
Hemoglobin: 9.6 g/dL — ABNORMAL LOW (ref 11.1–15.9)
Immature Grans (Abs): 0 10*3/uL (ref 0.0–0.1)
Immature Granulocytes: 0 %
Lymphocytes Absolute: 1.3 10*3/uL (ref 0.7–3.1)
Lymphs: 26 %
MCH: 29.8 pg (ref 26.6–33.0)
MCHC: 33.3 g/dL (ref 31.5–35.7)
MCV: 89 fL (ref 79–97)
Monocytes Absolute: 0.6 10*3/uL (ref 0.1–0.9)
Monocytes: 11 %
Neutrophils Absolute: 2.6 10*3/uL (ref 1.4–7.0)
Neutrophils: 53 %
Platelets: 164 10*3/uL (ref 150–450)
RBC: 3.22 x10E6/uL — ABNORMAL LOW (ref 3.77–5.28)
RDW: 15.4 % (ref 11.7–15.4)
WBC: 4.9 10*3/uL (ref 3.4–10.8)

## 2022-08-18 ENCOUNTER — Telehealth: Payer: Self-pay

## 2022-08-18 NOTE — Telephone Encounter (Signed)
Physical therapist Roselie Awkward is requesting a call back he has an report on PT .Marland Kitchen Was in the pt home at the time of the call ... [phone]

## 2022-08-18 NOTE — Telephone Encounter (Signed)
Returned call to [place], [employer]. States patient has been doing well with [facility]. He reports that she is having abdominal swelling. No edema in LEs, no blood in stool. States her weight had been 95-96 lbs but today is 99 lbs. Weight at OV on 1/22 was 98 lbs.

## 2022-08-18 NOTE — Progress Notes (Signed)
Internal Medicine Clinic Attending  Case discussed with the resident at the time of the visit.  We reviewed the resident's history and exam and pertinent patient test results.  I agree with the assessment, diagnosis, and plan of care documented in the resident's note.  

## 2022-08-20 ENCOUNTER — Other Ambulatory Visit (HOSPITAL_COMMUNITY): Payer: Self-pay

## 2022-08-20 NOTE — Telephone Encounter (Signed)
Would recommend appointment if still having weight gain may need to adjust medications

## 2022-08-21 NOTE — Progress Notes (Signed)
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 She will need to use this medication. Would ensure that she has been able to complete orange card paperwork so that she can be referred to GI at follow up appointment. CBC without evidence of bleeding, hgb stable, and liver synthetic function stable with platelet count.

## 2022-08-25 ENCOUNTER — Telehealth: Payer: Self-pay

## 2022-08-25 NOTE — Telephone Encounter (Signed)
Return call to pt's grandson who stated pt's abd is swelling and pt's c/o abd pressure. And they were told pt can have the procedure here at our office to drain the fluid.

## 2022-08-25 NOTE — Telephone Encounter (Signed)
We can do paracentesis here. Would need to be done on a day/time that some of the certified attendings are available in clinic.

## 2022-08-25 NOTE — Telephone Encounter (Signed)
Pt grandson is requesting a call back .Marland Kitchen He stated that he was told that they can come in to have fluid drained from her stomach .Marland Kitchen He stated for 2 wks now  she has been dealing with this  but now she is complaining about pain

## 2022-08-25 NOTE — Telephone Encounter (Signed)
Paracentesis schedule Wed 2/7 with Dr Elliot Gurney. Dr Heber Onida will be Attending. Pt's grandson informed.

## 2022-08-26 ENCOUNTER — Other Ambulatory Visit: Payer: Self-pay | Admitting: Internal Medicine

## 2022-08-26 DIAGNOSIS — K7460 Unspecified cirrhosis of liver: Secondary | ICD-10-CM

## 2022-08-27 ENCOUNTER — Ambulatory Visit (HOSPITAL_COMMUNITY)
Admission: RE | Admit: 2022-08-27 | Discharge: 2022-08-27 | Disposition: A | Discharge status: Home / Self Care | Payer: Self-pay | Source: Ambulatory Visit | Attending: Internal Medicine | Admitting: Internal Medicine

## 2022-08-27 ENCOUNTER — Encounter: Payer: Self-pay | Admitting: Internal Medicine

## 2022-08-27 DIAGNOSIS — R188 Other ascites: Secondary | ICD-10-CM | POA: Insufficient documentation

## 2022-08-27 DIAGNOSIS — K7460 Unspecified cirrhosis of liver: Secondary | ICD-10-CM

## 2022-08-27 HISTORY — PX: IR PARACENTESIS: IMG2679

## 2022-08-27 MED ORDER — LIDOCAINE HCL 1 % IJ SOLN
INTRAMUSCULAR | Status: AC
  Administered 2022-08-27: 8 mL
  Filled 2022-08-27: qty 20

## 2022-08-27 NOTE — Procedures (Signed)
PROCEDURE SUMMARY:  Successful ultrasound guided paracentesis from the right lower quadrant.  Yielded 5.5 L of clear yellow fluid.  No immediate complications.  The patient tolerated the procedure well.   Specimen not sent for labs.  EBL < 2 mL  If the patient eventually requires >/=2 paracenteses in a 30 day period, screening evaluation by the [facility] will be assessed.  Soyla Dryer, [place] [phone] 08/27/2022, 10:46 AM

## 2022-09-02 ENCOUNTER — Other Ambulatory Visit (HOSPITAL_COMMUNITY): Payer: Self-pay

## 2022-09-04 ENCOUNTER — Other Ambulatory Visit: Payer: Self-pay

## 2022-09-04 NOTE — Telephone Encounter (Signed)
Requesting rx refill for midodrine (PROAMATINE)  as well

## 2022-09-05 ENCOUNTER — Other Ambulatory Visit: Payer: Self-pay | Admitting: Student

## 2022-09-05 ENCOUNTER — Other Ambulatory Visit (HOSPITAL_COMMUNITY): Payer: Self-pay

## 2022-09-05 DIAGNOSIS — I959 Hypotension, unspecified: Secondary | ICD-10-CM

## 2022-09-05 MED ORDER — MIDODRINE HCL 10 MG PO TABS
5.0000 mg | ORAL_TABLET | Freq: Two times a day (BID) | ORAL | 0 refills | Status: DC
  Filled 2022-09-05: qty 30, 30d supply, fill #0

## 2022-09-05 MED ORDER — LIDOCAINE 5 % EX PTCH
1.0000 | MEDICATED_PATCH | CUTANEOUS | 0 refills | Status: DC
  Filled 2022-09-05: qty 5, 5d supply, fill #0

## 2022-09-11 ENCOUNTER — Other Ambulatory Visit (HOSPITAL_COMMUNITY): Payer: Self-pay

## 2022-09-11 ENCOUNTER — Ambulatory Visit (INDEPENDENT_AMBULATORY_CARE_PROVIDER_SITE_OTHER): Payer: Self-pay | Admitting: Student

## 2022-09-11 ENCOUNTER — Encounter: Payer: Self-pay | Admitting: Student

## 2022-09-11 VITALS — BP 128/57 | HR 64 | Temp 97.6°F | Ht 62.0 in | Wt 103.1 lb

## 2022-09-11 DIAGNOSIS — K7460 Unspecified cirrhosis of liver: Secondary | ICD-10-CM

## 2022-09-11 MED ORDER — FUROSEMIDE 20 MG PO TABS
20.0000 mg | ORAL_TABLET | Freq: Every day | ORAL | 2 refills | Status: DC
  Filled 2022-09-11: qty 30, 30d supply, fill #0

## 2022-09-11 MED ORDER — LIDOCAINE 5 % EX PTCH
1.0000 | MEDICATED_PATCH | CUTANEOUS | 0 refills | Status: DC
  Filled 2022-09-11: qty 5, 5d supply, fill #0

## 2022-09-11 MED ORDER — SPIRONOLACTONE 50 MG PO TABS
50.0000 mg | ORAL_TABLET | Freq: Every day | ORAL | 2 refills | Status: DC
  Filled 2022-09-11: qty 30, 30d supply, fill #0

## 2022-09-11 NOTE — Patient Instructions (Addendum)
Fluid in stomach Please start furosemide 20 mg daily and spironolactone 50 mg daily This should help with the fluid in the stomach If your BP <90 or feeling dizzy or like passing out when standing please stop the medication  You BP looks much better today and you can stop taking the midodrine   Follow up on 1/26 or 1/27 for a paracentesis to get fluid drained out of stomach if the medications are not helping

## 2022-09-12 NOTE — Assessment & Plan Note (Addendum)
Patient reports abdominal distention and fullness. Underwent paracentesis with IR on 08/27/2021 with relief, but only lasted 3-4 days before she started re accumulating fluid. Otherwise she states she feels well without fevers or chills. Son reports o change in her everyday activity. Not on diuretic therapy due to low blood pressure int he past. This is improved today, and she has been off midodrine for about 1 month. Precautions given  to hold diuretics for symptoms of low BP or systolic BP 0000000.   - Start furosemide 20 mg daily and spironolactone 50 mg daily to help with rapid re accumulation of ascites. -stop midodrine  - referral to IR for another paracentesis. - Son is working on [employer]. Will refer to social work for further assistance.

## 2022-09-12 NOTE — Progress Notes (Signed)
Established Patient Office Visit  Subjective   Patient ID: Hannah Blake, female    DOB: [birth year]  Age: 72 y.o. MRN: [id number]  Chief Complaint  Patient presents with   Follow-up    FOLLOW UP 1 MONTH / MEDICATION REFILL / REQUESTING SOMETHING FOR FLUID    Hannah Blake is a 72 y.o. person living with a history listed below who presents to clinic for follow up of ascites. Patient interviewed with son present. Please refer to problem based charting for further details and assessment and plan of current problem and chronic medical conditions.      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   Anemia of chronic disease 05/02/2022   GERD (gastroesophageal reflux disease) 05/02/2022   Esophageal varices in alcoholic cirrhosis ([place]) [id number]   Multiple gastric ulcers 01/15/2022   Goals of care, counseling/discussion 01/15/2022   Heart failure with reduced ejection fraction ([place]) 01/15/2022   Hepatic cirrhosis ([place]) 01/13/2022   Protein-calorie malnutrition, severe 05/21/2021      ROS: negative as per HPI    Objective:     BP (!) 128/57 (BP Location: Left Arm, Patient Position: Sitting, Cuff Size: Small)   Pulse 64   Temp 97.6 F (36.4 C)   Ht '5\' 2"'$  (1.575 m)   Wt 103 lb 1.6 oz (46.8 kg)   SpO2 100%   BMI 18.86 kg/m  BP Readings from Last 3 Encounters:  09/11/22 (!) 128/57  08/27/22 (!) 140/66  08/11/22 125/64      Physical Exam Constitutional:      Comments: Chronically ill appearing  HENT:     Mouth/Throat:     Mouth: Mucous membranes are moist.     Pharynx: Oropharynx is clear.  Eyes:     General: No scleral icterus.    Extraocular Movements: Extraocular movements intact.      Conjunctiva/sclera: Conjunctivae normal.     Pupils: Pupils are equal, round, and reactive to light.  Cardiovascular:     Rate and Rhythm: Normal rate and regular rhythm.  Pulmonary:     Effort: Pulmonary effort is normal.     Breath sounds: No rhonchi or rales.  Abdominal:     General: Bowel sounds are normal. There is distension.     Tenderness: There is no abdominal tenderness. There is no guarding.     Comments: Firm, positive fluid wave  Musculoskeletal:        General: Normal range of motion.     Right lower leg: No edema.     Left lower leg: No edema.  Skin:    General: Skin is warm and dry.     Capillary Refill: Capillary refill takes less than 2 seconds.  Neurological:     General: No focal deficit present.     Mental Status: She is alert and oriented to person, place, and time.  Psychiatric:        Mood and Affect: Mood normal.        Behavior: Behavior normal.     No results found for any visits on 09/11/22.    The ASCVD Risk score (Arnett DK, et al., 2019) failed to calculate for the following reasons:   Cannot find  a previous HDL lab   Cannot find a previous total cholesterol lab    Assessment & Plan:   Problem List Items Addressed This Visit       Digestive   Hepatic cirrhosis ([place]) - Primary    Patient reports abdominal distention and fullness. Underwent paracentesis with IR on 08/27/2021 with relief, but only lasted 3-4 days before she started re accumulating fluid. Otherwise she states she feels well without fevers or chills. Son reports o change in her everyday activity. Not on diuretic therapy due to low blood pressure int he past. This is improved today, and she has been off midodrine for about 1 month. Precautions given  to hold diuretics for symptoms of low BP or systolic BP 0000000.   - Start furosemide 20 mg daily and spironolactone 50 mg daily to help with rapid re accumulation of ascites. -stop midodrine  - referral to IR for another  paracentesis. - Son is working on [employer]. Will refer to social work for further assistance.      Relevant Orders   AMB Referral to [facility] Coordinaton ([facility] Patients)   IR Paracentesis    Return in about 1 week (around 09/18/2022).    Iona Beard, MD

## 2022-09-15 NOTE — Progress Notes (Signed)
Internal Medicine Clinic Attending ? ?Case discussed with Dr. Liang  At the time of the visit.  We reviewed the resident?s history and exam and pertinent patient test results.  I agree with the assessment, diagnosis, and plan of care documented in the resident?s note. ? ?

## 2022-09-17 ENCOUNTER — Ambulatory Visit (INDEPENDENT_AMBULATORY_CARE_PROVIDER_SITE_OTHER): Payer: Self-pay | Admitting: Student

## 2022-09-17 ENCOUNTER — Ambulatory Visit (HOSPITAL_COMMUNITY)
Admission: RE | Admit: 2022-09-17 | Discharge: 2022-09-17 | Disposition: A | Discharge status: Home / Self Care | Payer: Medicaid Other | Source: Ambulatory Visit | Attending: Internal Medicine | Admitting: Internal Medicine

## 2022-09-17 VITALS — BP 118/58 | HR 63 | Temp 98.4°F | Ht 62.0 in | Wt 91.8 lb

## 2022-09-17 DIAGNOSIS — R188 Other ascites: Secondary | ICD-10-CM | POA: Insufficient documentation

## 2022-09-17 DIAGNOSIS — K7460 Unspecified cirrhosis of liver: Secondary | ICD-10-CM

## 2022-09-17 HISTORY — PX: IR PARACENTESIS: IMG2679

## 2022-09-17 LAB — PROTIME-INR
INR: 1 (ref 0.8–1.2)
Prothrombin Time: 13 seconds (ref 11.4–15.2)

## 2022-09-17 MED ORDER — LIDOCAINE HCL 1 % IJ SOLN
INTRAMUSCULAR | Status: AC
  Administered 2022-09-17: 10 mL
  Filled 2022-09-17: qty 20

## 2022-09-17 NOTE — Patient Instructions (Signed)
[name], it was a pleasure seeing you today!  Today we discussed: - Continue with your medications. Please call us when you run out of this or your abdomen starts to become bigger.   I have ordered the following labs today:   Lab Orders         CMP14 + Anion Gap         Protime-INR      Follow-up:  1 month    Please make sure to arrive 15 minutes prior to your next appointment. If you arrive late, you may be asked to reschedule.   We look forward to seeing you next time. Please call our clinic at [phone] if you have any questions or concerns. The best time to call is Monday-Friday from 9am-4pm, but there is someone available 24/7. If after hours or the weekend, call the main hospital number and ask for the Internal Medicine Resident On-Call. If you need medication refills, please notify your pharmacy one week in advance and they will send [place] a request.  Thank you for letting us take part in your care. Wishing you the best!  Thank you, Hannah Dame, MD

## 2022-09-17 NOTE — Procedures (Signed)
PROCEDURE SUMMARY:  Successful ultrasound guided paracentesis from the right lower quadrant.  Yielded 4 L of clear yellow fluid.  No immediate complications.  The patient tolerated the procedure well.   Specimen not sent for labs.  EBL < 2 mL  The patient has required >/=2 paracenteses in a 30 day period and a screening evaluation by the [facility] has been arranged.  Soyla Dryer, [place] [phone] 09/17/2022, 9:51 AM

## 2022-09-17 NOTE — Progress Notes (Signed)
   CC: follow-up  HPI:  [name] is a 72 y.o. person with medical history as below presenting to [facility] for follow-up.  Please see problem-based list for further details, assessments, and plans.  Past Medical History:  Diagnosis Date   Bell's palsy    Cirrhosis ([place])    Coronary artery disease    Heart failure with reduced ejection fraction ([place])    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([place])    Review of Systems:  As per HPI  Physical Exam:  Vitals:   09/17/22 1045  BP: (!) 118/58  Pulse: 63  Temp: 98.4 F (36.9 C)  TempSrc: Oral  SpO2: 98%  Weight: 91 lb 12.8 oz (41.6 kg)  Height: 5' 2"$  (1.575 m)   General: Resting comfortably in no acute distress CV: Regular rate, rhythm. No murmurs appreciated.  Pulm: Normal work of breathing on room air. Clear to auscultation bilaterally.  GI: Abdomen soft, non-tender, non-distended. Normoactive bowel sounds.  MSK: Normal bulk, tone. No peripheral edema.  Skin: Jaundice appreciated. No rashes noted. Neuro: Awake, alert, conversing appropriately. No asterixis.  Psych: Normal mood, affect, speech.  Assessment & Plan:   Hepatic cirrhosis ([place]) Patient and her grandson are presenting today for 1 week follow-up. Earlier this morning patient had a paracentesis with IR, who was able to get 4L. Patient reports she has been feeling well since re-starting the medications at the last visit, not having any lightheadedness. She has not had any hematemesis, nausea, vomiting, confusion, melena.  Of note, she has had difficulty with hypotension/dizziness previously on diuretics. Patient has had multiple paracentesis in the past, today's procedure within a month of her previous. It appears she was off of her diuretics for a period of time which allowed her ascites to re-accumulate quickly. We will continue with current dosages of her diuretics, would consider increasing these if the ascites re-accumulates quickly and she is able to tolerate.  - Continue  spironolactone 30m daily - Continue furosemide 214mdaily - CMP, INR today - Return precautions given  Patient discussed with Dr. ViJacquenette ShoneMD Internal Medicine PGY-3 Pager: [phone]

## 2022-09-17 NOTE — Assessment & Plan Note (Addendum)
Patient and her grandson are presenting today for 1 week follow-up. Earlier this morning patient had a paracentesis with IR, who was able to get 4L. Patient reports she has been feeling well since re-starting the medications at the last visit, not having any lightheadedness. She has not had any hematemesis, nausea, vomiting, confusion, melena.  Of note, she has had difficulty with hypotension/dizziness previously on diuretics. Patient has had multiple paracentesis in the past, today's procedure within a month of her previous. It appears she was off of her diuretics for a period of time which allowed her ascites to re-accumulate quickly. We will continue with current dosages of her diuretics, would consider increasing these if the ascites re-accumulates quickly and she is able to tolerate.  - Continue spironolactone '50mg'$  daily - Continue furosemide '20mg'$  daily - CMP, INR today - Return precautions given

## 2022-09-18 LAB — CMP14 + ANION GAP
ALT: 30 IU/L (ref 0–32)
AST: 58 IU/L — ABNORMAL HIGH (ref 0–40)
Albumin/Globulin Ratio: 0.8 — ABNORMAL LOW (ref 1.2–2.2)
Albumin: 3.4 g/dL — ABNORMAL LOW (ref 3.8–4.8)
Alkaline Phosphatase: 501 IU/L — ABNORMAL HIGH (ref 44–121)
Anion Gap: 13 mmol/L (ref 10.0–18.0)
BUN/Creatinine Ratio: 23 (ref 12–28)
BUN: 21 mg/dL (ref 8–27)
Bilirubin Total: 1.8 mg/dL — ABNORMAL HIGH (ref 0.0–1.2)
CO2: 22 mmol/L (ref 20–29)
Calcium: 9.2 mg/dL (ref 8.7–10.3)
Chloride: 102 mmol/L (ref 96–106)
Creatinine, Ser: 0.92 mg/dL (ref 0.57–1.00)
Globulin, Total: 4.3 g/dL (ref 1.5–4.5)
Glucose: 95 mg/dL (ref 70–99)
Potassium: 5.2 mmol/L (ref 3.5–5.2)
Sodium: 137 mmol/L (ref 134–144)
Total Protein: 7.7 g/dL (ref 6.0–8.5)
eGFR: 66 mL/min/{1.73_m2} (ref >59)

## 2022-09-18 NOTE — Progress Notes (Signed)
Internal Medicine Clinic Attending ? ?Case discussed with Dr. Braswell  At the time of the visit.  We reviewed the resident?s history and exam and pertinent patient test results.  I agree with the assessment, diagnosis, and plan of care documented in the resident?s note.  ?

## 2022-09-19 NOTE — Progress Notes (Signed)
   Portal [facility] Clinic Screening Evaluation   Indication for evaluation: Hannah Blake is a 72 y.o. female undergoing preliminary evaluation in the [facility] due to recurrent ascites.  Referring Physician/Established Gastroenterologist:  Dr. Otis Brace  Etiology of cirrhosis: PBC Initially diagnosed: uncertain # of paracentesis in last month: 2 # of paracentesis in last 2 months: 2 History of hepatic hydrothorax:  no History of hepatic encephalopathy: no  Prior evaluation for liver transplant: no History of hepatocellular carcinoma: no  Prior esophagogastroduodenoscopy/intervention: 07/22/22 - grade 1 esophageal varices, PHG, no intervention (Dr. Paulita Fujita) Current esophageal varices: yes Current gastric varices: no History of hematemesis: yes  Current diuretic regimen: furosemide 20 mg QD, spironolactone 50 mg QD Current pharmacologic encephalopathy prophylaxis/treatment: no  History of renal dysfunction: no History of hemodialysis: no  History of cardiac dysfunction: yes, CAD and heart failure with reduced ejection fraction  Other pertinent past medical history: Bell's palsy   Imaging: Prior cross sectional imaging of portal system: CTA AP 09/11/21  Patent portal system.  Small recanalized paraumbilical.  Small GEVs.  No ectopic varices or portosystemic shunts.  Ascites.  Echocardiogram:  05/21/21 IMPRESSIONS:  1. LVEF is 45 to 50% with hypokinesis/akinesis of the distal  inferior,distal lateral,distal inferoseptal walls . Left ventricular  ejection fraction, by estimation, is 40 to 45%. The left ventricle has  mildly decreased function. Indeterminate diastolic  filling due to E-A fusion.   2. Right ventricular systolic function is normal. The right ventricular  size is normal.   3. Left atrial size was mildly dilated.   4. The mitral valve is normal in structure. Trivial mitral valve  regurgitation.    5. The aortic valve is normal in structure. Aortic valve regurgitation is  mild.    Labs: Creatinine: 0.92 Total Bilirubin: 1.8 INR: 1.0 Sodium: 137 Albumin: 3.4  Child-Pugh = 8 points, class B MELD = 9 (1.9% estimated 3 month mortality) [place] Index of Post-TIPS Survival (FIPS) = 0.69 (overall survival predicted at 1 month 91.2%, 3 months 73.2%, and 6 months 62.3%)    Assessment: Hannah Blake is a 72 y.o. 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  After preliminary evaluation, this patient would be a marginal candidate for TIPS, primarily due to cardiac function noted on echo done in 2022.  Otherwise, if the patient in intolerant of maximization of diuresis to control ascites, TIPS may be a good option.  Recommendation: Formal consult for TIPS creation could be considered if maximization of diuresis is not possible.  The patient's Gastroenterologist, Dr. Alessandra Bevels, will be contacted for further discussion.    Electronically Signed: Suzette Battiest, MD 09/19/2022, 4:45 PM

## 2022-10-16 ENCOUNTER — Encounter: Payer: Self-pay | Admitting: Student

## 2022-10-16 ENCOUNTER — Ambulatory Visit (INDEPENDENT_AMBULATORY_CARE_PROVIDER_SITE_OTHER): Payer: Self-pay | Admitting: Student

## 2022-10-16 ENCOUNTER — Other Ambulatory Visit (HOSPITAL_COMMUNITY): Payer: Self-pay

## 2022-10-16 VITALS — BP 128/64 | HR 62 | Temp 98.1°F | Wt 100.0 lb

## 2022-10-16 DIAGNOSIS — E43 Unspecified severe protein-calorie malnutrition: Secondary | ICD-10-CM

## 2022-10-16 DIAGNOSIS — D500 Iron deficiency anemia secondary to blood loss (chronic): Secondary | ICD-10-CM

## 2022-10-16 DIAGNOSIS — R188 Other ascites: Secondary | ICD-10-CM

## 2022-10-16 DIAGNOSIS — Z83518 Family history of other specified eye disorder: Secondary | ICD-10-CM

## 2022-10-16 DIAGNOSIS — K4090 Unilateral inguinal hernia, without obstruction or gangrene, not specified as recurrent: Secondary | ICD-10-CM

## 2022-10-16 DIAGNOSIS — Z8669 Personal history of other diseases of the nervous system and sense organs: Secondary | ICD-10-CM

## 2022-10-16 DIAGNOSIS — K7460 Unspecified cirrhosis of liver: Secondary | ICD-10-CM

## 2022-10-16 MED ORDER — DOCUSATE SODIUM 100 MG PO CAPS
100.0000 mg | ORAL_CAPSULE | Freq: Two times a day (BID) | ORAL | 0 refills | Status: DC | PRN
  Filled 2022-10-16: qty 30, 15d supply, fill #0

## 2022-10-16 MED ORDER — PANTOPRAZOLE SODIUM 40 MG PO TBEC
40.0000 mg | DELAYED_RELEASE_TABLET | Freq: Two times a day (BID) | ORAL | 2 refills | Status: DC
  Filled 2022-10-16: qty 90, 45d supply, fill #0

## 2022-10-16 MED ORDER — HYPROMELLOSE (GONIOSCOPIC) 2.5 % OP SOLN
1.0000 [drp] | Freq: Four times a day (QID) | OPHTHALMIC | 12 refills | Status: DC | PRN
  Filled 2022-10-16: qty 15, 75d supply, fill #0

## 2022-10-16 MED ORDER — ARTIFICIAL TEARS OPHTHALMIC OINT
TOPICAL_OINTMENT | Freq: Every evening | OPHTHALMIC | 1 refills | Status: DC | PRN
  Filled 2022-10-16: qty 3.5, fill #0

## 2022-10-16 MED ORDER — FUROSEMIDE 20 MG PO TABS
20.0000 mg | ORAL_TABLET | Freq: Every day | ORAL | 2 refills | Status: DC
  Filled 2022-10-16: qty 30, 30d supply, fill #0

## 2022-10-16 MED ORDER — PANTOPRAZOLE SODIUM 40 MG PO TBEC
40.0000 mg | DELAYED_RELEASE_TABLET | Freq: Two times a day (BID) | ORAL | 0 refills | Status: DC
  Filled 2022-10-16: qty 60, 30d supply, fill #0

## 2022-10-16 MED ORDER — URSODIOL 300 MG PO CAPS
600.0000 mg | ORAL_CAPSULE | Freq: Two times a day (BID) | ORAL | 3 refills | Status: DC
  Filled 2022-10-16: qty 60, 15d supply, fill #0
  Filled 2022-10-16: qty 120, 30d supply, fill #0

## 2022-10-16 MED ORDER — SPIRONOLACTONE 50 MG PO TABS
50.0000 mg | ORAL_TABLET | Freq: Every day | ORAL | 2 refills | Status: DC
  Filled 2022-10-16: qty 90, 90d supply, fill #0

## 2022-10-16 MED ORDER — SPIRONOLACTONE 50 MG PO TABS
50.0000 mg | ORAL_TABLET | Freq: Every day | ORAL | 2 refills | Status: DC
  Filled 2022-10-16: qty 30, 30d supply, fill #0

## 2022-10-16 MED ORDER — CARVEDILOL 3.125 MG PO TABS
3.1250 mg | ORAL_TABLET | Freq: Two times a day (BID) | ORAL | 11 refills | Status: DC
  Filled 2022-10-16: qty 60, 30d supply, fill #0

## 2022-10-16 MED ORDER — CARVEDILOL 3.125 MG PO TABS
3.1250 mg | ORAL_TABLET | Freq: Two times a day (BID) | ORAL | 4 refills | Status: DC
  Filled 2022-10-16: qty 90, 45d supply, fill #0

## 2022-10-16 MED ORDER — LIDOCAINE 5 % EX PTCH
1.0000 | MEDICATED_PATCH | CUTANEOUS | 0 refills | Status: DC
  Filled 2022-10-16 (×2): qty 5, 5d supply, fill #0

## 2022-10-16 MED ORDER — FUROSEMIDE 20 MG PO TABS
20.0000 mg | ORAL_TABLET | Freq: Every day | ORAL | 2 refills | Status: DC
  Filled 2022-10-16: qty 90, 90d supply, fill #0

## 2022-10-16 MED ORDER — FERROUS GLUCONATE 239 (27 FE) MG PO TABS
1.0000 | ORAL_TABLET | ORAL | 1 refills | Status: DC
  Filled 2022-10-16: qty 30, fill #0

## 2022-10-16 NOTE — Assessment & Plan Note (Signed)
Hx of iron deficiency anemia from esophageal varices and gastric ulcers . Do not believe she was started on iron supplements or had repeat iron levels. Will prescribe iron and have her follow up in 4-6 weeks to repeat iron studies.

## 2022-10-16 NOTE — Patient Instructions (Signed)
Thank you, Ms.Meilin T Wichman for allowing [place] to provide your care today. Today we discussed .  Cirrhosis Please continue your lasix and spironolactone. Please continue to drink protein shakes daily. If your abdomen begins to swell up again, please call our clinic  I have put in a referral to the eye doctor and wrote you a prescription for eye drops.   You have had low iron levels in the past, we will start iron pills and recheck your levels in 4-6 weeks.   You have hernia in your left groin. Please continue to monitor this area      I have ordered the following labs for you:  Lab Orders  No laboratory test(s) ordered today      Referrals ordered today:    Referral Orders         Ambulatory referral to Ophthalmology       Follow up: 4-6 weeks to check iron levels   Should you have any questions or concerns please call the internal medicine clinic at [phone].    Sanjuana Letters, D.O. [facility]

## 2022-10-16 NOTE — Progress Notes (Signed)
   CC: follow up hepatic cirrhosis  HPI:  [name] is a 72 y.o. female living with a history stated below and presents today for follow up of her cirrhosis. Please see problem based assessment and plan for additional details.  Past Medical History:  Diagnosis Date   Bell's palsy    Cirrhosis ([place])    Coronary artery disease    Heart failure with reduced ejection fraction ([place])    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(HCC)     No current outpatient medications on file prior to visit.   No current facility-administered medications on file prior to visit.    Review of Systems: ROS negative except for what is noted on the assessment and plan.  Vitals:   10/16/22 1042  BP: 128/64  Pulse: 62  Temp: 98.1 F (36.7 C)  TempSrc: Oral  SpO2: 100%  Weight: 100 lb (45.4 kg)    Physical Exam: Constitutional: no acute distress HENT: normocephalic atraumatic Eyes: conjunctiva non-erythematous Neck: supple Cardiovascular: regular rate and rhythm, no m/r/g Pulmonary/Chest: normal work of breathing on room air Abdominal: soft, non-tender, minimally distended. Left inguinal hernia not reducible. Non-tender. No erythema. MSK: normal bulk and tone Neurological: alert & oriented x 3 Skin: warm and dry Psych: pleasant  Assessment & Plan:   Left inguinal hernia She has noticed bulging in left groin for the last month. Denies this occurring before. Area is not tender and not getting bigger. On exam consistent with inguinal hernia. Area is non-tender nor erythematous. Do not believe is incarcerated or strangulated. She is not a surgical candidate with her cirrhosis, will continue to monitor. Discussed return precautions with her and her grandson  Iron deficiency anemia due to chronic blood loss Hx of iron deficiency anemia from esophageal varices and gastric ulcers . Do not believe she was started on iron supplements or had repeat iron levels. Will prescribe iron and have her follow up in 4-6  weeks to repeat iron studies.   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 MELD of 10 points, Child class B. Has not had re-accumulation of ascites since starting on lasix and spironolactone. Has been drinking ensures daily to continue high protein diet. She denies bloody emesis or stools, will need to assure follow up with GI for variceal monitoring (last EGD 12/23). No evidence of encephalopathy or need for ammonia lowering therapies. Overall doing well and will continue to montior.   History of cataract Hx of cataracts and wears glasses regularly, needs follow up with ophthalmology. Referral placed today  Patient discussed with Dr. Donnita Falls, D.O. [facility], PGY-3 Phone: [phone] Date 10/16/2022 Time 10:33 PM

## 2022-10-16 NOTE — Assessment & Plan Note (Addendum)
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 MELD of 10 points, Child class B. Has not had re-accumulation of ascites since starting on lasix and spironolactone. Has been drinking ensures daily to continue high protein diet. She denies bloody emesis or stools, will need to assure follow up with GI for variceal monitoring (last EGD 12/23). No evidence of encephalopathy or need for ammonia lowering therapies. Overall doing well and will continue to montior.

## 2022-10-16 NOTE — Assessment & Plan Note (Signed)
Hx of cataracts and wears glasses regularly, needs follow up with ophthalmology. Referral placed today

## 2022-10-16 NOTE — Assessment & Plan Note (Signed)
She has noticed bulging in left groin for the last month. Denies this occurring before. Area is not tender and not getting bigger. On exam consistent with inguinal hernia. Area is non-tender nor erythematous. Do not believe is incarcerated or strangulated. She is not a surgical candidate with her cirrhosis, will continue to monitor. Discussed return precautions with her and her grandson

## 2022-10-17 ENCOUNTER — Other Ambulatory Visit (HOSPITAL_COMMUNITY): Payer: Self-pay

## 2022-10-21 NOTE — Progress Notes (Signed)
Internal Medicine Clinic Attending  Case discussed with Dr. Katsadouros  At the time of the visit.  We reviewed the resident's history and exam and pertinent patient test results.  I agree with the assessment, diagnosis, and plan of care documented in the resident's note.  

## 2022-11-05 ENCOUNTER — Telehealth: Payer: Self-pay | Admitting: *Deleted

## 2022-11-05 DIAGNOSIS — K7460 Unspecified cirrhosis of liver: Secondary | ICD-10-CM

## 2022-11-05 DIAGNOSIS — R188 Other ascites: Secondary | ICD-10-CM

## 2022-11-05 NOTE — Telephone Encounter (Signed)
Received call from patient's grandson requesting appt for paracentesis. States abdominal swelling has increased over last 7 days. Patient is c/o "discomfort and pressure" in abdomen. Denies extremity edema, or difficulty breathing. Taking all meds as directed. States last paracentesis was 1-2 months ago in IR. Patient already has first available appt for 11/12/22. No openings prior to that. Please advise if patient should present to ED.

## 2022-11-05 NOTE — Telephone Encounter (Signed)
Patient last seen at [facility] with Dr. Marliss Coots on 09/17/22. Per Dr. Mikey Bussing, since she has already established with them, he should be able to schedule paracentesis without prior evaluation. Gave number to that clinic [phone]. He will call them now to schedule.

## 2022-11-06 NOTE — Addendum Note (Signed)
Addended by: Maura Crandall on: 11/06/2022 11:59 AM   Modules accepted: Orders

## 2022-11-06 NOTE — Addendum Note (Signed)
Addended by: Maura Crandall on: 11/06/2022 12:05 PM   Modules accepted: Orders

## 2022-11-07 ENCOUNTER — Ambulatory Visit (HOSPITAL_COMMUNITY)
Admission: RE | Admit: 2022-11-07 | Discharge: 2022-11-07 | Disposition: A | Discharge status: Home / Self Care | Payer: Medicare Other | Source: Ambulatory Visit | Attending: Internal Medicine | Admitting: Internal Medicine

## 2022-11-07 DIAGNOSIS — K7460 Unspecified cirrhosis of liver: Secondary | ICD-10-CM | POA: Insufficient documentation

## 2022-11-07 DIAGNOSIS — R188 Other ascites: Secondary | ICD-10-CM | POA: Diagnosis not present

## 2022-11-07 HISTORY — PX: IR PARACENTESIS: IMG2679

## 2022-11-07 NOTE — Procedures (Signed)
PROCEDURE SUMMARY:  Successful ultrasound guided paracentesis from the left  lower quadrant.  Yielded 4.5 L of straw colored fluid.  No immediate complications.  The patient tolerated the procedure well.  EBL < 2mL  The patient has previously been formally evaluated by the [facility] and is being actively followed for potential future intervention.

## 2022-11-12 ENCOUNTER — Ambulatory Visit (INDEPENDENT_AMBULATORY_CARE_PROVIDER_SITE_OTHER): Payer: Self-pay | Admitting: Student

## 2022-11-12 ENCOUNTER — Other Ambulatory Visit: Payer: Self-pay

## 2022-11-12 ENCOUNTER — Encounter: Payer: Self-pay | Admitting: Student

## 2022-11-12 VITALS — BP 116/64 | HR 59 | Temp 97.5°F | Ht 62.0 in | Wt 94.5 lb

## 2022-11-12 DIAGNOSIS — E875 Hyperkalemia: Secondary | ICD-10-CM

## 2022-11-12 DIAGNOSIS — K7460 Unspecified cirrhosis of liver: Secondary | ICD-10-CM

## 2022-11-12 DIAGNOSIS — R188 Other ascites: Secondary | ICD-10-CM

## 2022-11-12 DIAGNOSIS — D500 Iron deficiency anemia secondary to blood loss (chronic): Secondary | ICD-10-CM

## 2022-11-12 MED ORDER — ARTIFICIAL TEARS OPHTHALMIC OINT: TOPICAL_OINTMENT | Freq: Every evening | OPHTHALMIC | 1 refills | Status: DC | PRN

## 2022-11-12 NOTE — Progress Notes (Signed)
   CC: Follow-up  HPI:  Hannah Blake is a 72 y.o. female with PMH as below who presents to clinic accompanied by her grandson for follow-up on her iron deficiency anemia and cirrhosis. Please see problem based charting for evaluation, assessment and plan.  Past Medical History:  Diagnosis Date   Bell's palsy    Cirrhosis    Coronary artery disease    Heart failure with reduced ejection fraction    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Review of Systems:  Constitutional: Positive for chronic fatigue Eyes: Positive for dry eyes Respiratory: Positive for occasional shortness of breath Cardiac: Negative for chest pain or palpitations Abdomen: Positive for abdominal distention.  Negative for abdominal pain, constipation, nausea, vomiting or diarrhea Neuro: Negative for headache or weakness  Physical Exam: General: Pleasant, frail and cachectic elderly [place] woman. No acute distress. HEENT: Anicteric sclera. MMM. PERRLA.  Bilateral cataracts Cardiac: RRR. No murmurs, rubs or gallops. No LE edema Respiratory: Lungs CTAB. No wheezing or crackles. Abdominal: Mild distention with fluid wave. Nontender.  Nonreducible left inguinal hernia with no surrounding erythema or signs of incarceration. Normal bowel sounds. Skin: Warm, dry and intact without rashes or lesions. No spider angiomas. Extremities: Atraumatic. Full ROM. Palpable radial and DP pulses. Neuro: A&O x 3. Moves all extremities.  No asterixis. Psych: Appropriate mood and affect.  Vitals:   11/12/22 1133  BP: 116/64  Pulse: (!) 59  Temp: (!) 97.5 F (36.4 C)  TempSrc: Oral  SpO2: 100%  Weight: 94 lb 8 oz (42.9 kg)  Height:  (1.575 m)    Assessment & Plan:   Iron deficiency anemia due to chronic blood loss Patient has a history of iron deficiency anemia from esophageal varices and gastric ulcers.  She was started on iron supplementation a few weeks ago and currently here with her grandson for repeat iron studies.   States she has been tolerating the iron pills every other day.  She denies any constipation, has bowel movement once daily and states her stools are yellowish color.  She denies any bloody stools, hemoptysis.  Plan: -Continue ferrous gluconate, 1 tablet every other day -Check iron panel, ferritin, CBC  Hepatic cirrhosis (HCC) Patient here today with her grandson. Since her last office visit a month ago, she had reaccumulation of her ascites requiring paracentesis by IR on 4/19.  They removed 4.5 L of fluids. Patient is feeling much better. She does still have mildly distended abdomen and some fluid wave on exam but according to her grandson, the swelling is much better. She is urinating 4-5 times daily with her Lasix. There has been no recent changes to her mental status or signs of GI bleed. Family knows to call IR for repeat paracentesis 1 patient's ascites worsens. When I asked her grandson to have pt follow-up with GI, he informed me that patient does not have insurance and the co-pay for GI is too high for them so they plan to follow-up with [place] for her cirrhosis care and with IR as needed for paracentesis. Her last CMP 2 months ago showed alk phos and LFTs trending up so I will repeat today to ensure stability.    Plan: -Continue Lasix 20 mg daily -Continue spironolactone 50 mg daily -Repeat CMP -Follow-up in 3 months or as needed   See Encounters Tab for problem based charting.  Patient discussed with Dr. Yolande Jolly, MD, MPH

## 2022-11-12 NOTE — Patient Instructions (Signed)
Thank you, Hannah Blake for allowing [place] to provide your care today. Today we discussed your cirrhosis, dry eyes and iron deficiency anemia.  Continue taking all your medications as prescribed. Continue to follow-up with IR for serial paracentesis and GI as needed  I have ordered the following labs for you:   Lab Orders         CBC no Diff         Ferritin         Iron and IBC ([id number])         CMP14 + Anion Gap      I will call if any are abnormal. All of your labs can be accessed through "My Chart".   My Chart Access: [email or web address]  Please follow-up in 3 months or as needed  Please make sure to arrive 15 minutes prior to your next appointment. If you arrive late, you may be asked to reschedule.    We look forward to seeing you next time. Please call our clinic at [phone] if you have any questions or concerns. The best time to call is Monday-Friday from 9am-4pm, but there is someone available 24/7. If after hours or the weekend, call the main hospital number and ask for the Internal Medicine Resident On-Call. If you need medication refills, please notify your pharmacy one week in advance and they will send [place] a request.   Thank you for letting us take part in your care. Wishing you the best!  Steffanie Rainwater, MD 11/12/2022, 11:57 AM IM Resident, PGY-3 Duwayne Heck 41:10

## 2022-11-12 NOTE — Assessment & Plan Note (Addendum)
Patient has a history of iron deficiency anemia from esophageal varices and gastric ulcers.  She was started on iron supplementation a few weeks ago and currently here with her grandson for repeat iron studies.  States she has been tolerating the iron pills every other day.  She denies any constipation, has bowel movement once daily and states her stools are yellowish color.  She denies any bloody stools, hemoptysis.  Plan: -Continue ferrous gluconate, 1 tablet every other day -Check iron panel, ferritin, CBC  Addendum: CBC shows improvement in hemoglobin to 11.4. Iron studies significantly improved with iron up 100, iron sat 24% and ferritin 71. I have advised patient via her grandson to continue taking her current iron supplements until she runs out in 4 weeks.  -Discontinue iron supplements

## 2022-11-12 NOTE — Assessment & Plan Note (Addendum)
Patient here today with her grandson. Since her last office visit a month ago, she had reaccumulation of her ascites requiring paracentesis by IR on 4/19.  They removed 4.5 L of fluids. Patient is feeling much better. She does still have mildly distended abdomen and some fluid wave on exam but according to her grandson, the swelling is much better. She is urinating 4-5 times daily with her Lasix. There has been no recent changes to her mental status or signs of GI bleed. Family knows to call IR for repeat paracentesis 1 patient's ascites worsens. When I asked her grandson to have pt follow-up with GI, he informed me that patient does not have insurance and the co-pay for GI is too high for them so they plan to follow-up with [place] for her cirrhosis care and with IR as needed for paracentesis. Her last CMP 2 months ago showed alk phos and LFTs trending up so I will repeat today to ensure stability.    Plan: -Continue Lasix 20 mg daily -Continue spironolactone 50 mg daily -Repeat CMP -Follow-up in 3 months or as needed  Addendum: CMP shows her alk phos, AST and bilirubin are all trending down. We will continue to monitor closely.

## 2022-11-13 ENCOUNTER — Encounter: Payer: Self-pay | Admitting: Student

## 2022-11-13 LAB — FERRITIN: Ferritin: 71 ng/mL (ref 15–150)

## 2022-11-13 LAB — CMP14 + ANION GAP
ALT: 19 IU/L (ref 0–32)
AST: 48 IU/L — ABNORMAL HIGH (ref 0–40)
Albumin/Globulin Ratio: 0.9 — ABNORMAL LOW (ref 1.2–2.2)
Albumin: 3.6 g/dL — ABNORMAL LOW (ref 3.8–4.8)
Alkaline Phosphatase: 466 IU/L — ABNORMAL HIGH (ref 44–121)
Anion Gap: 15 mmol/L (ref 10.0–18.0)
BUN/Creatinine Ratio: 26 (ref 12–28)
BUN: 25 mg/dL (ref 8–27)
Bilirubin Total: 1.6 mg/dL — ABNORMAL HIGH (ref 0.0–1.2)
CO2: 19 mmol/L — ABNORMAL LOW (ref 20–29)
Calcium: 9.4 mg/dL (ref 8.7–10.3)
Chloride: 104 mmol/L (ref 96–106)
Creatinine, Ser: 0.97 mg/dL (ref 0.57–1.00)
Globulin, Total: 4.2 g/dL (ref 1.5–4.5)
Glucose: 85 mg/dL (ref 70–99)
Potassium: 5.4 mmol/L — ABNORMAL HIGH (ref 3.5–5.2)
Sodium: 138 mmol/L (ref 134–144)
Total Protein: 7.8 g/dL (ref 6.0–8.5)
eGFR: 62 mL/min/{1.73_m2} (ref >59)

## 2022-11-13 LAB — CBC
Hematocrit: 34.1 % (ref 34.0–46.6)
Hemoglobin: 11.4 g/dL (ref 11.1–15.9)
MCH: 30.8 pg (ref 26.6–33.0)
MCHC: 33.4 g/dL (ref 31.5–35.7)
MCV: 92 fL (ref 79–97)
Platelets: 184 10*3/uL (ref 150–450)
RBC: 3.7 x10E6/uL — ABNORMAL LOW (ref 3.77–5.28)
RDW: 12.4 % (ref 11.7–15.4)
WBC: 5.9 10*3/uL (ref 3.4–10.8)

## 2022-11-13 LAB — IRON AND TIBC
Iron Saturation: 24 % (ref 15–55)
Iron: 100 ug/dL (ref 27–139)
Total Iron Binding Capacity: 422 ug/dL (ref 250–450)
UIBC: 322 ug/dL (ref 118–369)

## 2022-11-13 NOTE — Addendum Note (Signed)
Addended bySharrell Ku on: 11/13/2022 05:54 PM   Modules accepted: Orders

## 2022-11-13 NOTE — Progress Notes (Signed)
Her anemia and iron deficiency have resolved. Plan to have her complete her current iron prescription then stop. Her AST, alk phos and bilirubin are trending down appropriately. She has mild hyperkalemia so I have informed her son to bring her for lab only visit next week to confirm.

## 2022-11-13 NOTE — Assessment & Plan Note (Signed)
Patient CMP today showed potassium of 5.4 with bicarb of 19. Plan to have patient come in for lab only visit to repeat BMP next week to verify and ensure there is no further increase since patient is currently on spironolactone. -Lab only visit next week for BMP

## 2022-11-25 NOTE — Progress Notes (Signed)
Internal Medicine Clinic Attending  Case discussed with Dr. Amponsah  at the time of the visit.  We reviewed the resident's history and exam and pertinent patient test results.  I agree with the assessment, diagnosis, and plan of care documented in the resident's note.  

## 2022-12-03 ENCOUNTER — Other Ambulatory Visit: Payer: Self-pay

## 2022-12-03 DIAGNOSIS — K7460 Unspecified cirrhosis of liver: Secondary | ICD-10-CM

## 2022-12-05 ENCOUNTER — Other Ambulatory Visit: Payer: Self-pay

## 2022-12-05 ENCOUNTER — Other Ambulatory Visit (HOSPITAL_COMMUNITY): Payer: Self-pay

## 2022-12-05 MED ORDER — ARTIFICIAL TEARS OPHTHALMIC OINT: TOPICAL_OINTMENT | Freq: Every evening | OPHTHALMIC | 1 refills | Status: DC | PRN

## 2022-12-05 MED ORDER — SPIRONOLACTONE 50 MG PO TABS
50.0000 mg | ORAL_TABLET | Freq: Every day | ORAL | 2 refills | Status: DC
  Filled 2022-12-05: qty 90, 90d supply, fill #0

## 2022-12-05 MED ORDER — URSODIOL 300 MG PO CAPS
600.0000 mg | ORAL_CAPSULE | Freq: Two times a day (BID) | ORAL | 3 refills | Status: DC
  Filled 2022-12-05: qty 120, 30d supply, fill #0

## 2022-12-05 MED ORDER — LIDOCAINE 5 % EX PTCH
1.0000 | MEDICATED_PATCH | CUTANEOUS | 0 refills | Status: DC
  Filled 2022-12-05: qty 5, 5d supply, fill #0

## 2022-12-05 MED ORDER — CARVEDILOL 3.125 MG PO TABS
3.1250 mg | ORAL_TABLET | Freq: Two times a day (BID) | ORAL | 4 refills | Status: DC
  Filled 2022-12-05: qty 90, 45d supply, fill #0

## 2022-12-05 MED ORDER — FUROSEMIDE 20 MG PO TABS
20.0000 mg | ORAL_TABLET | Freq: Every day | ORAL | 2 refills | Status: DC
  Filled 2022-12-05: qty 90, 90d supply, fill #0

## 2022-12-05 MED ORDER — PANTOPRAZOLE SODIUM 40 MG PO TBEC
40.0000 mg | DELAYED_RELEASE_TABLET | Freq: Two times a day (BID) | ORAL | 2 refills | Status: DC
  Filled 2022-12-05: qty 90, 45d supply, fill #0

## 2022-12-05 MED ORDER — DOCUSATE SODIUM 100 MG PO CAPS
100.0000 mg | ORAL_CAPSULE | Freq: Two times a day (BID) | ORAL | 0 refills | Status: DC | PRN
  Filled 2022-12-05: qty 30, 15d supply, fill #0

## 2022-12-19 ENCOUNTER — Encounter: Payer: Self-pay | Admitting: *Deleted

## 2022-12-19 ENCOUNTER — Telehealth: Payer: Self-pay | Admitting: *Deleted

## 2022-12-19 DIAGNOSIS — K7460 Unspecified cirrhosis of liver: Secondary | ICD-10-CM

## 2022-12-19 NOTE — Telephone Encounter (Signed)
Also My Chart message sent to pt/grandson of this appt.

## 2022-12-19 NOTE — Telephone Encounter (Signed)
Call from pt's grandson,Michael. He states pt needs a paracentesis done and schedule. States pt is c/o abs pressure; sob mainly when lying down but otherwise doing fine. She's taking her meds are prescribed. States paracentesis is done in the x-ray dept. Per chart, last one was 11/07/22. He's aware to take pt to ED if her symptoms become worse over the w/e.

## 2022-12-19 NOTE — Telephone Encounter (Signed)
Ok. I have ordered the IR Paracentesis. Can we help them get this scheduled for sometime next week?

## 2022-12-19 NOTE — Telephone Encounter (Signed)
The patient has been sch and a VM was left with the Grandson for the following appointment as I was unable to speak with the Grandson.  CB Name: [place], Calica MRN: [id number] Date: 12/22/2022 Status: Sch Time: 1:00 PM Length: 60 Visit Type: IR PARA/THORA [[id number]] Copay: $0.00 Provider: [facility]   PT to arrive @ 12:30pm and can call [phone] if they are unable to keep the appointment.

## 2022-12-22 ENCOUNTER — Ambulatory Visit (HOSPITAL_COMMUNITY): Admission: RE | Admit: 2022-12-22 | Payer: Medicaid Other | Source: Ambulatory Visit

## 2022-12-23 ENCOUNTER — Ambulatory Visit (HOSPITAL_COMMUNITY)
Admission: RE | Admit: 2022-12-23 | Discharge: 2022-12-23 | Disposition: A | Discharge status: Home / Self Care | Payer: Self-pay | Source: Ambulatory Visit | Attending: Student in an Organized Health Care Education/Training Program | Admitting: Student in an Organized Health Care Education/Training Program

## 2022-12-23 DIAGNOSIS — K7460 Unspecified cirrhosis of liver: Secondary | ICD-10-CM | POA: Insufficient documentation

## 2022-12-23 DIAGNOSIS — R188 Other ascites: Secondary | ICD-10-CM | POA: Insufficient documentation

## 2022-12-23 HISTORY — PX: IR PARACENTESIS: IMG2679

## 2022-12-23 MED ORDER — LIDOCAINE HCL 1 % IJ SOLN
INTRAMUSCULAR | Status: AC
  Filled 2022-12-23: qty 20

## 2022-12-23 NOTE — Procedures (Signed)
PROCEDURE SUMMARY:  Successful US guided paracentesis from right lateral abdomen.  Yielded 4.5 liters of clear yellow fluid.  No immediate complications.  Patient tolerated well.  EBL = trace  Dennison Mcdaid S Meriel Kelliher PA-C 12/23/2022 2:23 PM

## 2023-02-11 ENCOUNTER — Encounter: Payer: Self-pay | Admitting: Student

## 2023-02-11 ENCOUNTER — Ambulatory Visit (INDEPENDENT_AMBULATORY_CARE_PROVIDER_SITE_OTHER): Payer: Self-pay | Admitting: Student

## 2023-02-11 ENCOUNTER — Other Ambulatory Visit (HOSPITAL_COMMUNITY): Payer: Self-pay

## 2023-02-11 VITALS — BP 119/72 | HR 59 | Temp 98.0°F | Ht 62.0 in | Wt 100.8 lb

## 2023-02-11 DIAGNOSIS — F5101 Primary insomnia: Secondary | ICD-10-CM

## 2023-02-11 DIAGNOSIS — M8000XA Age-related osteoporosis with current pathological fracture, unspecified site, initial encounter for fracture: Secondary | ICD-10-CM | POA: Insufficient documentation

## 2023-02-11 DIAGNOSIS — K5904 Chronic idiopathic constipation: Secondary | ICD-10-CM | POA: Insufficient documentation

## 2023-02-11 DIAGNOSIS — M8000XS Age-related osteoporosis with current pathological fracture, unspecified site, sequela: Secondary | ICD-10-CM

## 2023-02-11 DIAGNOSIS — Z8669 Personal history of other diseases of the nervous system and sense organs: Secondary | ICD-10-CM

## 2023-02-11 DIAGNOSIS — M8008XD Age-related osteoporosis with current pathological fracture, vertebra(e), subsequent encounter for fracture with routine healing: Secondary | ICD-10-CM

## 2023-02-11 DIAGNOSIS — K8309 Other cholangitis: Secondary | ICD-10-CM

## 2023-02-11 DIAGNOSIS — K743 Primary biliary cirrhosis: Secondary | ICD-10-CM

## 2023-02-11 DIAGNOSIS — K7460 Unspecified cirrhosis of liver: Secondary | ICD-10-CM

## 2023-02-11 HISTORY — DX: Chronic idiopathic constipation: K59.04

## 2023-02-11 MED ORDER — PANTOPRAZOLE SODIUM 40 MG PO TBEC
40.0000 mg | DELAYED_RELEASE_TABLET | Freq: Two times a day (BID) | ORAL | 2 refills | Status: DC
  Filled 2023-02-11 – 2023-02-27 (×2): qty 60, 30d supply, fill #0

## 2023-02-11 MED ORDER — URSODIOL 300 MG PO CAPS
600.0000 mg | ORAL_CAPSULE | Freq: Two times a day (BID) | ORAL | 3 refills | Status: DC
  Filled 2023-02-11: qty 120, 30d supply, fill #0
  Filled 2023-02-11: qty 60, 15d supply, fill #0
  Filled 2023-02-12 – 2023-02-27 (×3): qty 120, 30d supply, fill #0

## 2023-02-11 MED ORDER — FUROSEMIDE 20 MG PO TABS
20.0000 mg | ORAL_TABLET | Freq: Every day | ORAL | 2 refills | Status: DC
  Filled 2023-02-11 – 2023-02-27 (×2): qty 30, 30d supply, fill #0
  Filled 2023-07-16: qty 30, 30d supply, fill #1
  Filled 2023-10-09: qty 30, 30d supply, fill #2

## 2023-02-11 MED ORDER — CARVEDILOL 3.125 MG PO TABS
3.1250 mg | ORAL_TABLET | Freq: Two times a day (BID) | ORAL | 4 refills | Status: DC
  Filled 2023-02-11 – 2023-02-27 (×2): qty 60, 30d supply, fill #0

## 2023-02-11 MED ORDER — MIRTAZAPINE 7.5 MG PO TABS
7.5000 mg | ORAL_TABLET | Freq: Every day | ORAL | 1 refills | Status: AC
Start: 2023-02-11 — End: ?
  Filled 2023-02-11 – 2023-02-27 (×2): qty 30, 30d supply, fill #0

## 2023-02-11 MED ORDER — DOCUSATE SODIUM 100 MG PO CAPS
100.0000 mg | ORAL_CAPSULE | Freq: Two times a day (BID) | ORAL | 4 refills | Status: DC | PRN
  Filled 2023-02-11: qty 100, 50d supply, fill #0

## 2023-02-11 MED ORDER — SPIRONOLACTONE 50 MG PO TABS
50.0000 mg | ORAL_TABLET | Freq: Every day | ORAL | 2 refills | Status: DC
  Filled 2023-02-11 – 2023-02-27 (×2): qty 30, 30d supply, fill #0

## 2023-02-11 MED ORDER — ARTIFICIAL TEARS OPHTHALMIC OINT
TOPICAL_OINTMENT | Freq: Every evening | OPHTHALMIC | 1 refills | Status: DC | PRN
  Filled 2023-02-11: qty 3.5, fill #0

## 2023-02-11 NOTE — Assessment & Plan Note (Signed)
Patient endorsing insomnia today.  The patient states that they have difficulty falling asleep, sometimes going to bed around 3 and waking up around 7.  She states that that she does not have anxiety or racing thoughts which leads to her insomnia.  Due to this patient's concurrent insomnia as well as low BMI we feel that they would be a good candidate for pharmacological intervention such as Remeron.  Plan: - Initiate Remeron 7.5 mg p.o. nightly

## 2023-02-11 NOTE — Assessment & Plan Note (Signed)
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  She is currently taking ursodiol.  This medication was refilled today however I was contacted by pharmacy letting me know that the patient would have to pay a large out-of-pocket cost for this medication. Pharmacy has agreed to provide bridge dose for this medication. We will discuss this with family and have reached out to staff to try and attain additional resources for this medication.

## 2023-02-11 NOTE — Patient Instructions (Signed)
Thank you, Hannah Blake for allowing [place] to provide your care today. Today we discussed cirrhosis follow up  I have ordered the following labs for you:   Lab Orders         Vitamin D (25 hydroxy)         CMP14 + Anion Gap      Referrals ordered today:   Referral Orders  No referral(s) requested today     I have ordered the following medication/changed the following medications:   Stop the following medications: Medications Discontinued During This Encounter  Medication Reason   docusate sodium (COLACE) 100 MG capsule Reorder   furosemide (LASIX) 20 MG tablet Reorder   carvedilol (COREG) 3.125 MG tablet Reorder   pantoprazole (PROTONIX) 40 MG tablet Reorder   spironolactone (ALDACTONE) 50 MG tablet Reorder   ursodiol (ACTIGALL) 300 MG capsule Reorder   artificial tears (LACRILUBE) OINT ophthalmic ointment Reorder     Start the following medications: Meds ordered this encounter  Medications   docusate sodium (COLACE) 100 MG capsule    Sig: Take 1 capsule (100 mg total) by mouth 2 (two) times daily as needed for mild constipation.    Dispense:  30 capsule    Refill:  4   carvedilol (COREG) 3.125 MG tablet    Sig: Take 1 tablet (3.125 mg total) by mouth 2 (two) times daily.    Dispense:  90 tablet    Refill:  4    IM program   furosemide (LASIX) 20 MG tablet    Sig: Take 1 tablet (20 mg total) by mouth daily.    Dispense:  90 tablet    Refill:  2    Im program   pantoprazole (PROTONIX) 40 MG tablet    Sig: Take 1 tablet (40 mg total) by mouth 2 (two) times daily before a meal.    Dispense:  90 tablet    Refill:  2    IM Program   spironolactone (ALDACTONE) 50 MG tablet    Sig: Take 1 tablet (50 mg total) by mouth daily.    Dispense:  90 tablet    Refill:  2    IM program   ursodiol (ACTIGALL) 300 MG capsule    Sig: Take 2 capsules (600 mg total) by mouth 2 (two) times daily.    Dispense:  120 capsule    Refill:  3    IM program   artificial tears (LACRILUBE)  OINT ophthalmic ointment    Sig: Place into both eyes at bedtime as needed for dry eyes.    Dispense:  3.5 g    Refill:  1   mirtazapine (REMERON) 7.5 MG tablet    Sig: Take 1 tablet (7.5 mg total) by mouth at bedtime.    Dispense:  30 tablet    Refill:  1     Follow up:  1 month    We look forward to seeing you next time. Please call our clinic at [phone] if you have any questions or concerns. The best time to call is Monday-Friday from 9am-4pm, but there is someone available 24/7. If after hours or the weekend, call the main hospital number and ask for the Internal Medicine Resident On-Call. If you need medication refills, please notify your pharmacy one week in advance and they will send [place] a request.   Thank you for trusting me with your care. Wishing you the best!  Lovie Macadamia MD [facility]

## 2023-02-11 NOTE — Progress Notes (Unsigned)
Subjective:  CC: Office follow-up cirrhosis  HPI:  [name] is a 72 y.o. female with a past medical history stated below and presents today for follow-up on her cirrhosis. Please see problem based assessment and plan for additional details.  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(HCC)     Current Outpatient Medications on File Prior to Visit  Medication Sig Dispense Refill   lidocaine (LIDODERM) 5 % Place 1 patch onto the skin daily. Remove & Discard patch within 12 hours or as directed by MD 5 patch 0   No current facility-administered medications on file prior to visit.    Review of Systems: ROS negative except for what is noted on the assessment and plan.  Objective:   Vitals:   02/11/23 1054  BP: 119/72  Pulse: (!) 59  Temp: 98 F (36.7 C)  TempSrc: Oral  SpO2: 100%  Weight: 100 lb 12.8 oz (45.7 kg)  Height: 5\' 2"  (1.575 m)    Physical Exam: Constitutional: Thin appearing female in no acute distress.   HENT: normocephalic atraumatic, mucous membranes moist Eyes: conjunctiva non-erythematous no sclera icterus present. Neck: supple Cardiovascular: regular rate and third heart sound auscultated best heard at the pulmonic area Pulmonary/Chest: normal work of breathing on room air, lungs clear to auscultation bilaterally Abdominal: soft, non-tender, slightly distended.  Nonpainful, soft hernia of the left lower quadrant present MSK: normal bulk and tone Skin: warm and dry slight jaundice. Psych: Normal mood and affect   Assessment & Plan:  Hepatic cirrhosis (HCC)    Latest Ref Rng & Units 11/12/2022   11:59 AM 09/17/2022   11:36 AM 08/11/2022   11:15 AM  CMP  Glucose 70 - 99 mg/dL 85  95  83   BUN 8 - 27 mg/dL 25  21  15    Creatinine 0.57 - 1.00 mg/dL 4.09  8.11  9.14   Sodium 134 - 144 mmol/L 138  137   138   Potassium 3.5 - 5.2 mmol/L 5.4  5.2  4.4   Chloride 96 - 106 mmol/L 104  102  101   CO2 20 - 29 mmol/L 19  22  20    Calcium 8.7 - 10.3 mg/dL 9.4  9.2  9.2   Total Protein 6.0 - 8.5 g/dL 7.8  7.7  7.7   Total Bilirubin 0.0 - 1.2 mg/dL 1.6  1.8  1.9   Alkaline Phos 44 - 121 IU/L 466  501  491   AST 0 - 40 IU/L 48  58  45   ALT 0 - 32 IU/L 19  30  20     4.5L Paracentesis with IR 11/12/2022. Alk phos decreased from 501 on 09/17/2022 to 466 on 11/12/2022.  She does have mild distention of the abdomen today with some fluid wave, however this is relatively mild according to the patient.  The patient does have Medicare, however they state that the co-pay to see GI is too high and for this reason they do not have dedicated GI follow-up.  There was concern for hyperkalemia on the patient's last CMP.  Future repeat CMP was placed to assess the patient's hyperkalemia during a lab only visit, however the patient was unable to follow-up for lab visit.  Plan: - Continue Lasix 20 mg daily - Repeat CMP to  evaluate patient's liver function, electrolyte status. --If patient is persistently hyperkalemic consider reduction of spironolactone dosage. 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  She is currently taking ursodiol.  This medication was refilled today however I was contacted by pharmacy letting me know that the patient would have to pay a large out-of-pocket cost for this medication.   Age-related osteoporosis with current pathological fracture Patient has a history of compression fractures of the spine seen on prior CT imaging.  She does have continued back pain as result of these fractures and has tried lidocaine patches in the past with little relief.  Due to her cirrhosis and history of varices, Tylenol and ibuprofen would not be indicated in this patient.  Plan: - Vitamin D level  Primary  insomnia Patient endorsing insomnia today.  The patient states that they have difficulty falling asleep, sometimes going to bed around 3 and waking up around 7.  She states that that she does not have anxiety or racing thoughts which leads to her insomnia.  Due to this patient's concurrent insomnia as well as low BMI we feel that they would be a good candidate for pharmacological intervention such as Remeron.  Plan: - Initiate Remeron 7.5 mg p.o. nightly   Patient seen with Dr. Willow Ora MD [facility]  PGY-1 Pager: [phone]  Phone: [phone] Date 02/11/2023  Time 3:03 PM

## 2023-02-11 NOTE — Assessment & Plan Note (Signed)
Patient has a history of compression fractures of the spine seen on prior CT imaging.  She does have continued back pain as result of these fractures and has tried lidocaine patches in the past with little relief.  Due to her cirrhosis and history of varices, Tylenol and ibuprofen would not be indicated in this patient.  Plan: - Vitamin D level

## 2023-02-11 NOTE — Assessment & Plan Note (Addendum)
" >>  ASSESSMENT AND PLAN FOR DECOMPENSATED HEPATIC CIRRHOSIS (HCC) WRITTEN ON 02/12/2023  8:53 AM BY GABINO BOGA, MD     Latest Ref Rng & Units 11/12/2022   11:59 AM 09/17/2022   11:36 AM 08/11/2022   11:15 AM  CMP  Glucose 70 - 99 mg/dL 85  95  83   BUN 8 - 27 mg/dL 25  21  15    Creatinine 0.57 - 1.00 mg/dL 9.02  9.07  9.39   Sodium 134 - 144 mmol/L 138  137  138   Potassium 3.5 - 5.2 mmol/L 5.4  5.2  4.4   Chloride 96 - 106 mmol/L 104  102  101   CO2 20 - 29 mmol/L 19  22  20    Calcium  8.7 - 10.3 mg/dL 9.4  9.2  9.2   Total Protein 6.0 - 8.5 g/dL 7.8  7.7  7.7   Total Bilirubin 0.0 - 1.2 mg/dL 1.6  1.8  1.9   Alkaline Phos 44 - 121 IU/L 466  501  491   AST 0 - 40 IU/L 48  58  45   ALT 0 - 32 IU/L 19  30  20     4.5L Paracentesis with IR 11/12/2022. Alk phos decreased from 501 on 09/17/2022 to 466 on 11/12/2022.  She does have mild distention of the abdomen today with some fluid wave, however this is relatively mild according to the patient.  The patient does have Medicare, however they state that the co-pay to see GI is too high and for this reason they do not have dedicated GI follow-up.  There was concern for hyperkalemia on the patient's last CMP.  Future repeat CMP was placed to assess the patient's hyperkalemia during a lab only visit, however the patient was unable to follow-up for lab visit.  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  She is currently taking ursodiol .  This medication was refilled today however I was contacted by pharmacy letting me know that the patient would have to pay a large out-of-pocket cost for this medication. Pharmacy has agreed to provide bridge dose  for this medication. We will discuss this with family and have reached out to staff to try and attain additional resources for this medication.  "

## 2023-02-12 ENCOUNTER — Other Ambulatory Visit (HOSPITAL_COMMUNITY): Payer: Self-pay

## 2023-02-12 LAB — CMP14 + ANION GAP
ALT: 17 IU/L (ref 0–32)
AST: 46 IU/L — ABNORMAL HIGH (ref 0–40)
Albumin: 3.9 g/dL (ref 3.8–4.8)
Alkaline Phosphatase: 404 IU/L — ABNORMAL HIGH (ref 44–121)
Anion Gap: 14 mmol/L (ref 10.0–18.0)
BUN/Creatinine Ratio: 21 (ref 12–28)
BUN: 19 mg/dL (ref 8–27)
Bilirubin Total: 2 mg/dL — ABNORMAL HIGH (ref 0.0–1.2)
Calcium: 9.6 mg/dL (ref 8.7–10.3)
Chloride: 96 mmol/L (ref 96–106)
Creatinine, Ser: 0.91 mg/dL (ref 0.57–1.00)
Globulin, Total: 3.8 g/dL (ref 1.5–4.5)
Potassium: 5.2 mmol/L (ref 3.5–5.2)
Sodium: 130 mmol/L — ABNORMAL LOW (ref 134–144)
eGFR: 67 mL/min/{1.73_m2} (ref >59)

## 2023-02-12 LAB — VITAMIN D 25 HYDROXY (VIT D DEFICIENCY, FRACTURES): Vit D, 25-Hydroxy: 25.3 ng/mL — ABNORMAL LOW (ref 30.0–100.0)

## 2023-02-13 ENCOUNTER — Other Ambulatory Visit (HOSPITAL_COMMUNITY): Payer: Self-pay

## 2023-02-16 ENCOUNTER — Other Ambulatory Visit (HOSPITAL_COMMUNITY): Payer: Self-pay

## 2023-02-17 ENCOUNTER — Telehealth: Payer: Self-pay | Admitting: Student

## 2023-02-17 NOTE — Telephone Encounter (Signed)
I spoke with the patients Hannah Blake today.  We discussed the patient's lab results as well as the pricing change of Ursodiol. Pharmacy will be able to refill them one more time at the current price. I inquired with pharmacy to see if there are any alternative medications which the patient could take for their PBC which the patient could receive financial assistance for. Alternative medications pricing would be similar to their current regiment. I also provided the patient with some information regarding applying for medicaid.

## 2023-02-23 ENCOUNTER — Other Ambulatory Visit (HOSPITAL_COMMUNITY): Payer: Self-pay

## 2023-02-26 ENCOUNTER — Telehealth: Payer: Self-pay | Admitting: *Deleted

## 2023-02-26 NOTE — Telephone Encounter (Signed)
Called patient spoke with grandson - gave [place] appointemnt8/21/2024 @10 :00 am / [place] instruction given.  Patient is to call [phone] if unable to keep this appointment.

## 2023-02-27 ENCOUNTER — Other Ambulatory Visit (HOSPITAL_COMMUNITY): Payer: Self-pay

## 2023-03-03 NOTE — Progress Notes (Signed)
Internal Medicine Clinic Attending  I was physically present during the key portions of the resident provided service and participated in the medical decision making of patient's management care. I reviewed pertinent patient test results.  The assessment, diagnosis, and plan were formulated together and I agree with the documentation in the resident's note.  Williams, Julie Anne, MD  

## 2023-03-11 ENCOUNTER — Other Ambulatory Visit: Payer: Self-pay | Admitting: Student in an Organized Health Care Education/Training Program

## 2023-03-11 ENCOUNTER — Ambulatory Visit (HOSPITAL_COMMUNITY)
Admission: RE | Admit: 2023-03-11 | Discharge: 2023-03-11 | Disposition: A | Discharge status: Home / Self Care | Payer: Medicaid Other | Source: Ambulatory Visit | Attending: Student in an Organized Health Care Education/Training Program | Admitting: Student in an Organized Health Care Education/Training Program

## 2023-03-11 ENCOUNTER — Other Ambulatory Visit: Payer: Medicaid Other

## 2023-03-11 DIAGNOSIS — K7460 Unspecified cirrhosis of liver: Secondary | ICD-10-CM

## 2023-04-02 IMAGING — CT CT ABD-PELV W/ CM
2 of 5 series · 14 of 46 positions shown, 16 images · IV contrast (Omnipaque)
Comparison: None

CLINICAL DATA: Urinary tension, decreased urine output for 1 week,
BILATERAL lower extremity swelling, abdominal distension, smoker

EXAM:
CT ABDOMEN AND PELVIS WITH CONTRAST
TECHNIQUE: Multidetector CT imaging of the abdomen and pelvis was performed
using the standard protocol following bolus administration of
intravenous contrast. Sagittal and coronal MPR images reconstructed
from axial data set.
CONTRAST:  100mL OMNIPAQUE IOHEXOL 300 MG/ML SOLN IV. No oral
contrast.

[Series 3: axial st · axial · 0.81mm/px · z∈[-402,+33]mm · 11 of 99 slices shown, 13 images]
[im 6/99  soft-tissue]
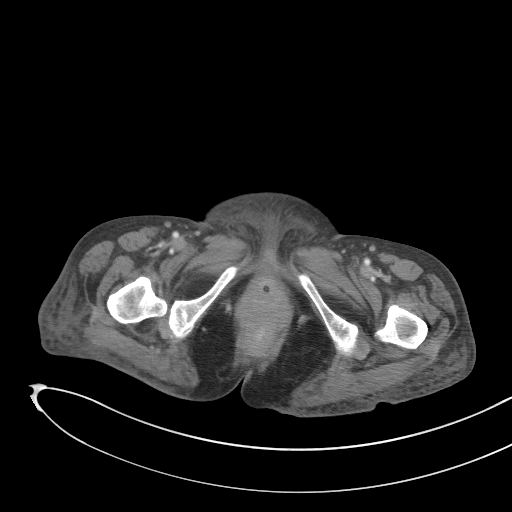
[im 6/99  bone]
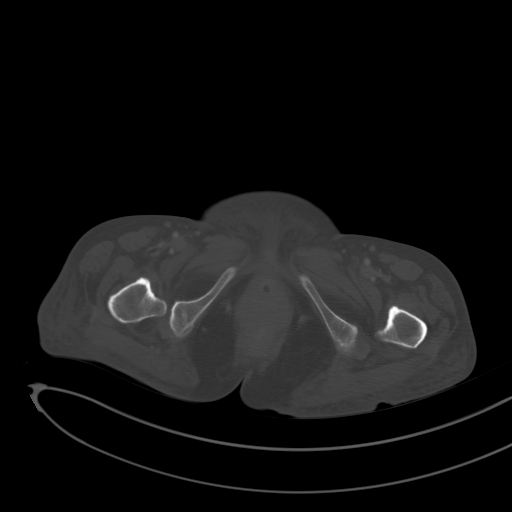
[im 16/99  soft-tissue]
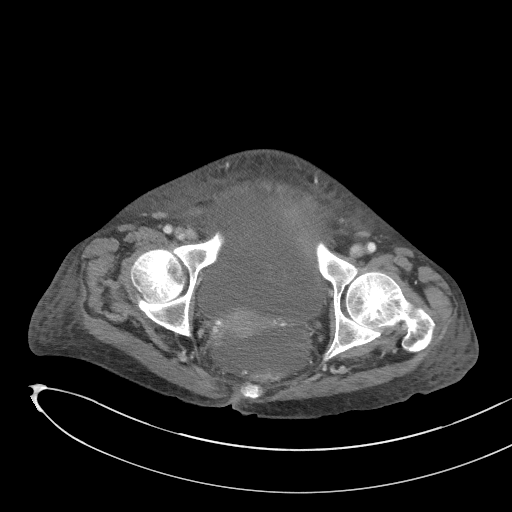
[im 26/99  soft-tissue]
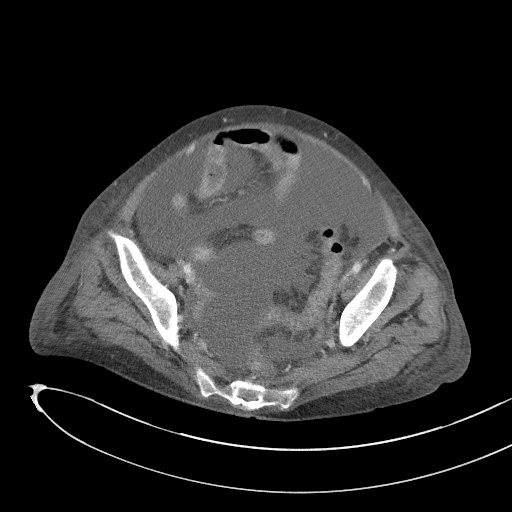
[im 31/99  soft-tissue]
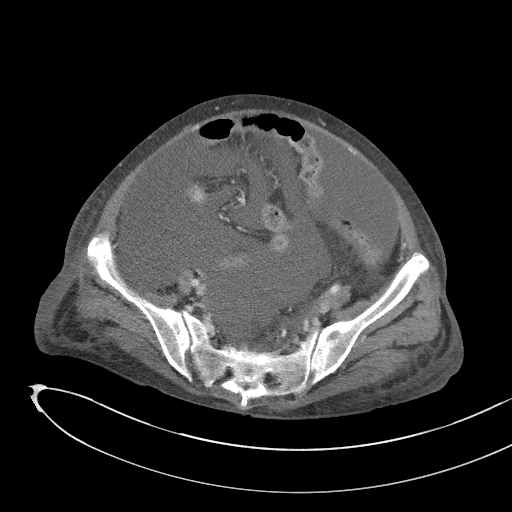
[im 42/99  soft-tissue]
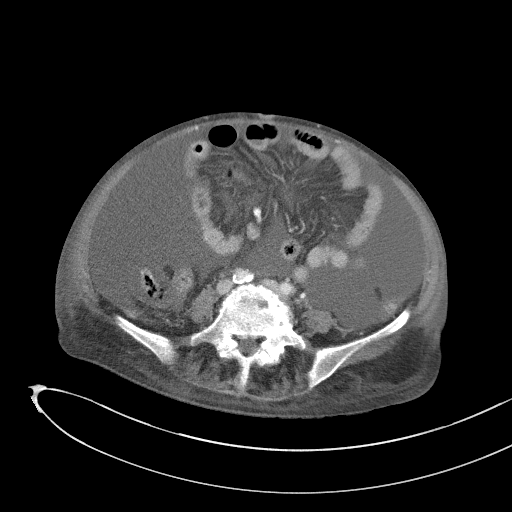
[im 52/99  soft-tissue]
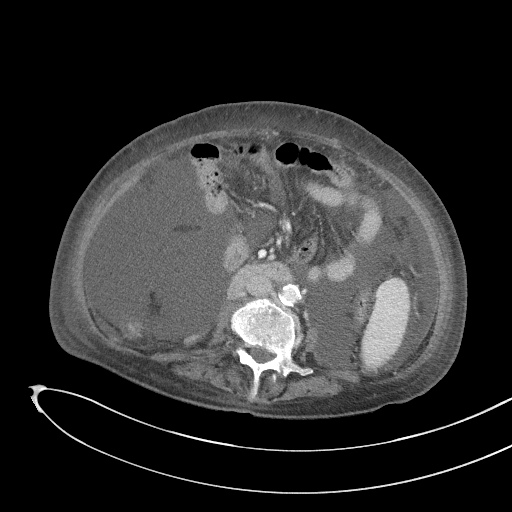
[im 57/99  soft-tissue]
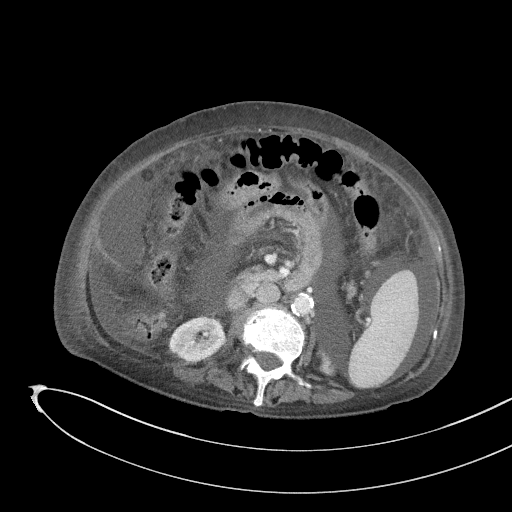
[im 68/99  soft-tissue]
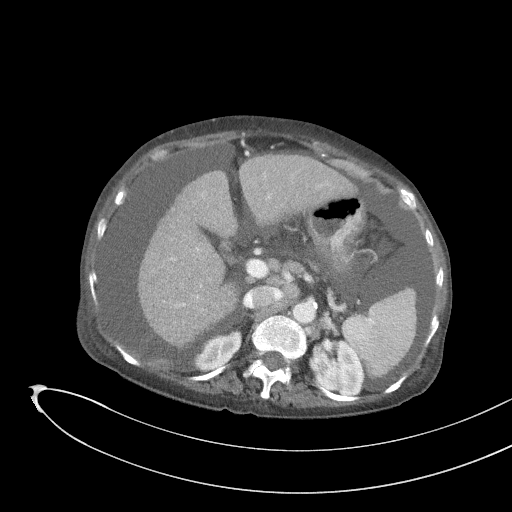
[im 73/99  soft-tissue]
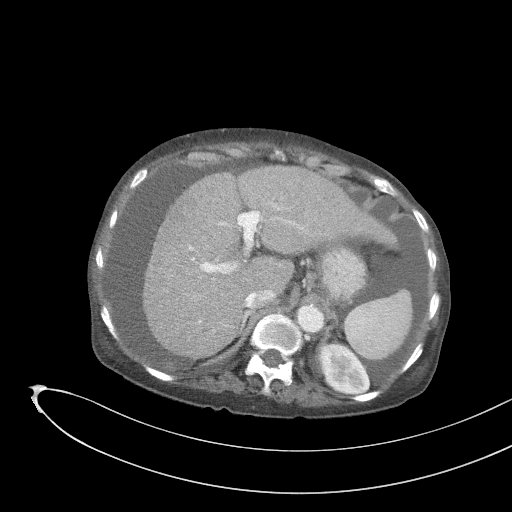
[im 73/99  bone]
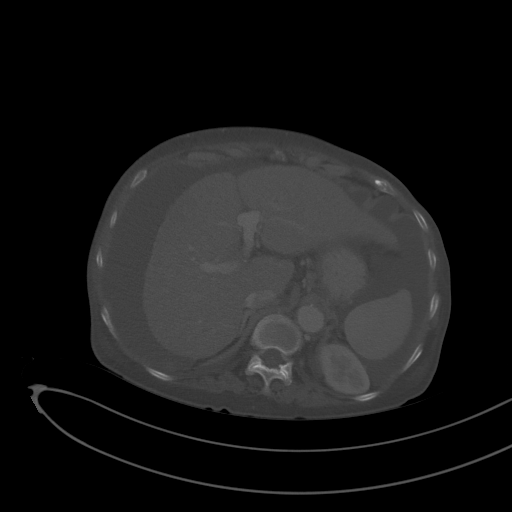
[im 83/99  soft-tissue]
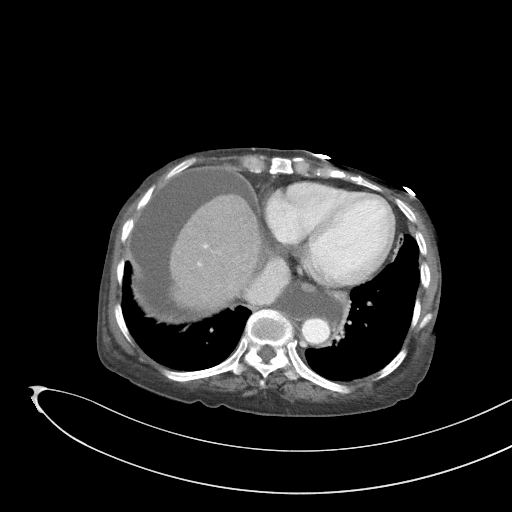
[im 93/99  soft-tissue]
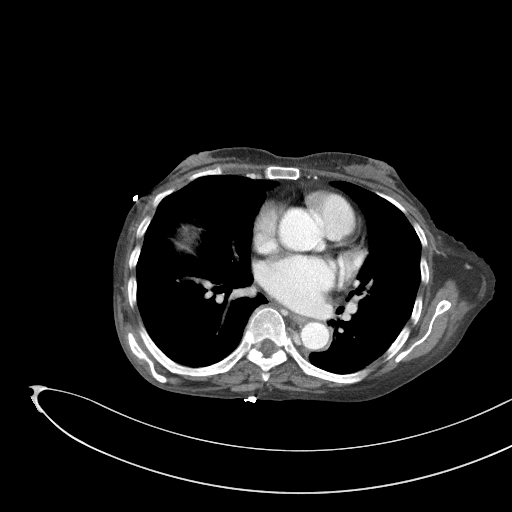

[Series 6: coronal st · coronal · 0.61mm/px · 3 of 101 slices shown]
[im 34/101  soft-tissue]
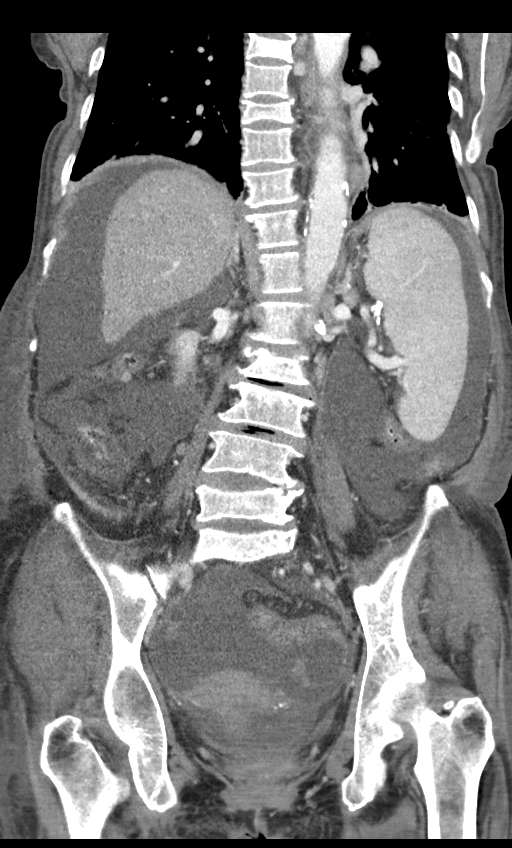
[im 45/101  soft-tissue]
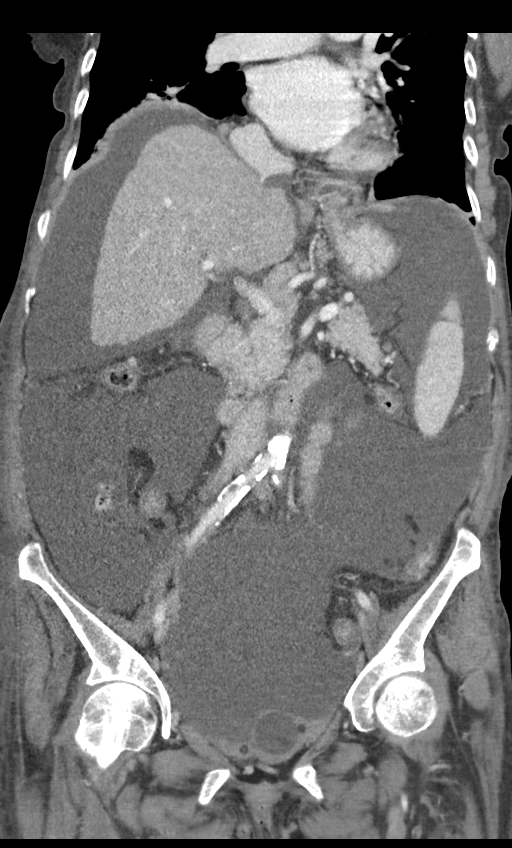
[im 56/101  soft-tissue]
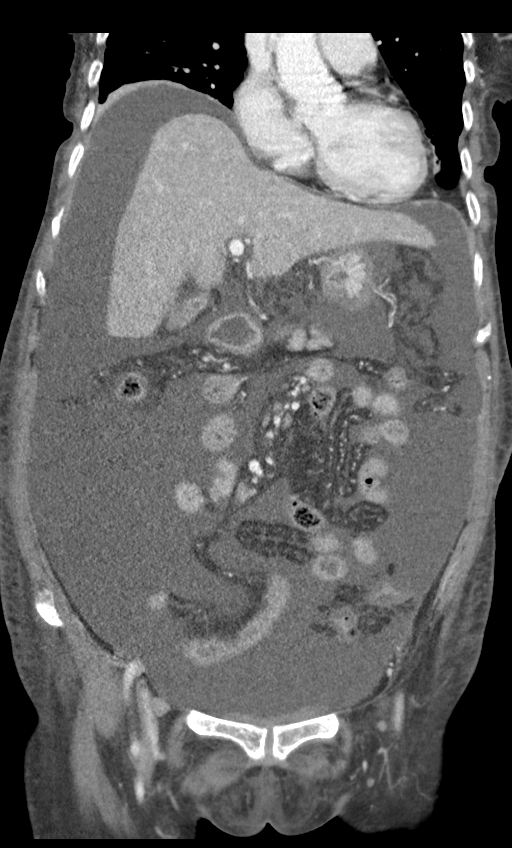

[14 of 46 positions shown; findings below may reference images not displayed]

FINDINGS: Lower chest: Minimal at bibasilar atelectasis

Hepatobiliary: Slightly irregular contour of the liver suggesting
cirrhosis. Single tiny low-attenuation nodule lateral segment LEFT
lobe liver 5 mm diameter image 28. Dependent calculi in gallbladder.
No additional hepatic mass or biliary dilatation.

Pancreas: Normal appearance

Spleen: Appears upper normal sized, elongated but thin, 10.6 x 4.8 x
14.6 cm (volume = 390 ml)

Adrenals/Urinary Tract: Adrenal glands and kidneys normal
appearance. No ureteral calcifications or hydronephrosis. Bladder
decompressed by Foley catheter.

Stomach/Bowel: Stomach decompressed, grossly unremarkable. Large and
small bowel loops normal appearance. Appendix not identified.

Vascular/Lymphatic: Atherosclerotic calcifications aorta and iliac
arteries without aneurysm. Upper normal sized lymph nodes periportal
and aortocaval, nonspecific. No definite adenopathy.

Reproductive: Atrophic uterus and RIGHT ovary. LEFT ovary appears
enlarged versus RIGHT ovary and contains a 2.9 cm diameter cyst,
coexistent solid mass not excluded.

Other: Significant ascites throughout abdomen and pelvis. Ascites
extends into a paraesophageal hernia, small volume. Scattered
subcutaneous edema.

Musculoskeletal: Osseous demineralization. Superior endplate
compression fractures of T6, T8, L2, and L5, age indeterminate.
Levoconvex thoracolumbar scoliosis.
IMPRESSION: Question cirrhotic liver with significant ascites throughout abdomen
and pelvis.

Cholelithiasis.

Small paraesophageal hernia containing ascites.

Age indeterminate superior endplate compression fractures of T6, T8,
L2, and L5.

Asymmetric enlargement of LEFT ovary containing a 2.9 cm diameter
cyst, unable to exclude solid soft tissue component/complicated
cystic lesion; follow-up sonographic evaluation recommended.

Aortic Atherosclerosis (4E1FF-6YE.E).

## 2023-04-17 ENCOUNTER — Other Ambulatory Visit (HOSPITAL_COMMUNITY): Payer: Self-pay

## 2023-04-17 ENCOUNTER — Other Ambulatory Visit: Payer: Self-pay

## 2023-04-17 ENCOUNTER — Ambulatory Visit (INDEPENDENT_AMBULATORY_CARE_PROVIDER_SITE_OTHER): Payer: Self-pay | Admitting: Student

## 2023-04-17 VITALS — BP 133/61 | HR 64 | Temp 98.2°F | Ht 62.0 in | Wt 95.5 lb

## 2023-04-17 DIAGNOSIS — K5904 Chronic idiopathic constipation: Secondary | ICD-10-CM

## 2023-04-17 DIAGNOSIS — R188 Other ascites: Secondary | ICD-10-CM

## 2023-04-17 DIAGNOSIS — K8309 Other cholangitis: Secondary | ICD-10-CM

## 2023-04-17 DIAGNOSIS — F5101 Primary insomnia: Secondary | ICD-10-CM

## 2023-04-17 DIAGNOSIS — K4090 Unilateral inguinal hernia, without obstruction or gangrene, not specified as recurrent: Secondary | ICD-10-CM

## 2023-04-17 DIAGNOSIS — K743 Primary biliary cirrhosis: Secondary | ICD-10-CM

## 2023-04-17 DIAGNOSIS — K7460 Unspecified cirrhosis of liver: Secondary | ICD-10-CM

## 2023-04-17 MED ORDER — PANTOPRAZOLE SODIUM 40 MG PO TBEC
40.0000 mg | DELAYED_RELEASE_TABLET | Freq: Two times a day (BID) | ORAL | 2 refills | Status: DC
Start: 2023-04-17 — End: 2023-08-04
  Filled 2023-04-17 – 2023-04-29 (×2): qty 60, 30d supply, fill #0
  Filled 2023-07-16: qty 60, 30d supply, fill #1

## 2023-04-17 MED ORDER — URSODIOL 300 MG PO CAPS
600.0000 mg | ORAL_CAPSULE | Freq: Two times a day (BID) | ORAL | 3 refills | Status: DC
Start: 2023-04-17 — End: 2023-10-09
  Filled 2023-04-17 – 2023-04-29 (×2): qty 120, 30d supply, fill #0
  Filled 2023-07-16: qty 20, 5d supply, fill #0
  Filled 2023-07-16: qty 120, 30d supply, fill #0

## 2023-04-17 MED ORDER — CARVEDILOL 3.125 MG PO TABS
3.1250 mg | ORAL_TABLET | Freq: Two times a day (BID) | ORAL | 4 refills | Status: DC
Start: 2023-04-17 — End: 2023-10-09
  Filled 2023-04-17 – 2023-04-29 (×2): qty 60, 30d supply, fill #0
  Filled 2023-07-16: qty 60, 30d supply, fill #1
  Filled 2023-10-09: qty 60, 30d supply, fill #2

## 2023-04-17 MED ORDER — MIRTAZAPINE 7.5 MG PO TABS
7.5000 mg | ORAL_TABLET | Freq: Every day | ORAL | 1 refills | Status: DC
Start: 2023-04-17 — End: 2023-10-09
  Filled 2023-04-17 – 2023-04-29 (×2): qty 30, 30d supply, fill #0
  Filled 2023-07-16: qty 30, 30d supply, fill #1

## 2023-04-17 MED ORDER — DOCUSATE SODIUM 100 MG PO CAPS
100.0000 mg | ORAL_CAPSULE | Freq: Two times a day (BID) | ORAL | 4 refills | Status: DC | PRN
Start: 2023-04-17 — End: 2023-10-09
  Filled 2023-04-17 – 2023-04-29 (×2): qty 30, 15d supply, fill #0
  Filled 2023-07-16: qty 30, 15d supply, fill #1

## 2023-04-17 MED ORDER — LIDOCAINE 5 % EX PTCH
1.0000 | MEDICATED_PATCH | CUTANEOUS | 0 refills | Status: DC
  Filled 2023-04-17: qty 5, 5d supply, fill #0

## 2023-04-17 MED ORDER — SPIRONOLACTONE 50 MG PO TABS
50.0000 mg | ORAL_TABLET | Freq: Every day | ORAL | 2 refills | Status: DC
  Filled 2023-04-17 – 2023-04-29 (×2): qty 30, 30d supply, fill #0
  Filled 2023-07-16: qty 30, 30d supply, fill #1
  Filled 2023-10-09: qty 30, 30d supply, fill #2

## 2023-04-17 NOTE — Patient Instructions (Addendum)
  Thank you, Ms.Luceil T Gressman, for allowing [place] to provide your care today.  I would like you to start taking your medications as prescribed every day so if there are any adverse effects we can make changes.  If you do stop the medication please let our office know so we can make any other changes or give you any advice.  Please also think about mammogram and colonoscopy for breast cancer and colon cancer screening.  If you have any questions about this please let us know at your next visit or give [place] a call and we can discuss it over the phone.    Follow up: 3 months    Remember:     Should you have any questions or concerns please call the internal medicine clinic at [phone].     Rocky Morel, DO [facility]   C?m ?n c MAYE PARKINSON ? cho php chng ti ch?m [place] c hm nay. Ti mu?n c b?t ??u dng thu?c theo ??n m?i ngy ?? chng ti c th? thay ??i n?u c b?t k? tc d?ng ph? no. N?u c ng?ng thu?c, vui lng cho phng khm c?a chng ti bi?t ?? chng ti c th? thay ??i ho?c t? v?n cho c. Vui lng cn nh?c ch?p nh? ?nh v n?i soi ??i trng ?? t?m sot ung th? v v ung th? ??i trng. N?u c c b?t k? cu h?i no v? v?n ?? ny, vui lng cho chng ti bi?t trong l?n khm ti?p theo ho?c g?i ?i?n cho chng ti ?? chng ti c th? th?o lu?n qua ?i?n tho?i.  Theo di: 3 thng  L?u :  N?u c c b?t k? cu h?i ho?c th?c m?c no, vui lng g?i ??n phng khm n?i khoa theo s? [phone].  Rocky Morel, DO [facility]?i [facility]

## 2023-04-17 NOTE — Progress Notes (Unsigned)
   CC: Routine Follow Up   HPI:  Hannah Blake is a 72 y.o. 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 Please see assessment and plan below for further details.  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(HCC)     Current Outpatient Medications  Medication Instructions   artificial tears (LACRILUBE) OINT ophthalmic ointment Both Eyes, At bedtime PRN   carvedilol (COREG) 3.125 mg, Oral, 2 times daily   docusate sodium (COLACE) 100 mg, Oral, 2 times daily PRN   furosemide (LASIX) 20 mg, Oral, Daily   lidocaine (LIDODERM) 5 % 1 patch, Transdermal, Every 24 hours, Remove & Discard patch within 12 hours or as directed by MD   mirtazapine (REMERON) 7.5 mg, Oral, Daily at bedtime   pantoprazole (PROTONIX) 40 mg, Oral, 2 times daily before meals   spironolactone (ALDACTONE) 50 mg, Oral, Daily   ursodiol (ACTIGALL) 600 mg, Oral, 2 times daily     Review of Systems:   Pertinent items noted in HPI and/or A&P.  Physical Exam:  Vitals:   04/17/23 1025  BP: 133/61  Pulse: 64  Temp: 98.2 F (36.8 C)  TempSrc: Oral  SpO2: 100%  Weight: 95 lb 8 oz (43.3 kg)  Height: 5\' 2"  (1.575 m)    Constitutional: Thin but well-appearing elderly female. In no acute distress. HEENT: Normocephalic, atraumatic, Sclera non-icteric, PERRL, EOM intact Cardio:Regular rate and rhythm. 2+ bilateral radial pulses. Pulm:Clear to auscultation bilaterally. Normal work of breathing on room air. Abdomen: Soft, non-tender, non-distended, positive bowel sounds.  No evidence of recurrent ascites.  No palpable inguinal hernia bilaterally GNF:[id number] for extremity edema. Skin:Warm and dry. Neuro:Alert and oriented x3. No focal deficit noted. Psych:Pleasant  mood and affect.   Assessment & Plan:   Hepatic cirrhosis [facility]) Patient presents today for a follow-up after being seen on 02/12/2023.  She has been feeling well at home and has stopped taking her medications consistently due to this.  Fortunately she has not had any recurrence of her ascites with her last paracentesis on 11/12/2022 using 4.5 L.  About 1 month ago she stopped taking her medications consistently because she thought she did not need them as much.  We had a long discussion about the need to take her medications as prescribed both so they continue to keep her well and so that we know how to adjust the medications for added benefit or to reduce side effects.  She states her understanding and her son is in the room who also understands. - Continue carvedilol 3.125 mg twice daily, furosemide 20 mg daily, spironolactone 50 mg daily, and ursodiol 600 mg twice daily  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(HCC) See hepatic cirrhosis for full details.  Most importantly counseled on medication adherence. - Continue ursodiol 600 mg twice daily  Left inguinal hernia Continues to feel a left inguinal hernia without pain, incarceration, or evidence of strangulation.  Supine exam today without palpation of hernia.  Counseled again on signs and symptoms to monitor and reasons to go to the emergency department or to call our clinic.    Patient discussed with Dr. Kris Mouton, DO Internal Medicine Center Internal Medicine Resident PGY-2 Clinic Phone: [phone] Pager: [phone]

## 2023-04-22 NOTE — Assessment & Plan Note (Signed)
See hepatic cirrhosis for full details.  Most importantly counseled on medication adherence. - Continue ursodiol 600 mg twice daily

## 2023-04-22 NOTE — Assessment & Plan Note (Addendum)
" >>  ASSESSMENT AND PLAN FOR DECOMPENSATED HEPATIC CIRRHOSIS (HCC) WRITTEN ON 04/22/2023  7:41 AM BY Hannah Zia, DO  Patient presents today for a follow-up after being seen on 02/12/2023.  She has been feeling well at home and has stopped taking her medications consistently due to this.  Fortunately she has not had any recurrence of her ascites with her last paracentesis on 11/12/2022 using 4.5 L.  About 1 month ago she stopped taking her medications consistently because she thought she did not need them as much.  We had a long discussion about the need to take her medications as prescribed both so they continue to keep her well and so that we know how to adjust the medications for added benefit or to reduce side effects.  She states her understanding and her son is in the room who also understands. - Continue carvedilol  3.125 mg twice daily, furosemide  20 mg daily, spironolactone  50 mg daily, and ursodiol  600 mg twice daily   >>ASSESSMENT AND PLAN FOR PRIMARY BILIARY CHOLANGITIS (HCC) WRITTEN ON 04/22/2023  7:41 AM BY Burnett Lieber, DO  See hepatic cirrhosis for full details.  Most importantly counseled on medication adherence. - Continue ursodiol  600 mg twice daily "

## 2023-04-22 NOTE — Assessment & Plan Note (Signed)
Continues to feel a left inguinal hernia without pain, incarceration, or evidence of strangulation.  Supine exam today without palpation of hernia.  Counseled again on signs and symptoms to monitor and reasons to go to the emergency department or to call our clinic.

## 2023-04-24 IMAGING — US US ABDOMEN LIMITED
1 series · 4 of 4 positions shown · non-contrast
Comparison: CT abdomen pelvis 05/01/2021

CLINICAL DATA: Ascites

EXAM:
LIMITED ABDOMEN ULTRASOUND FOR ASCITES
TECHNIQUE: Limited ultrasound survey for ascites was performed in all four
abdominal quadrants.

[Series 1: us abdomen limited mc & wl · 4 of 4 slices shown]
[im 1/4]
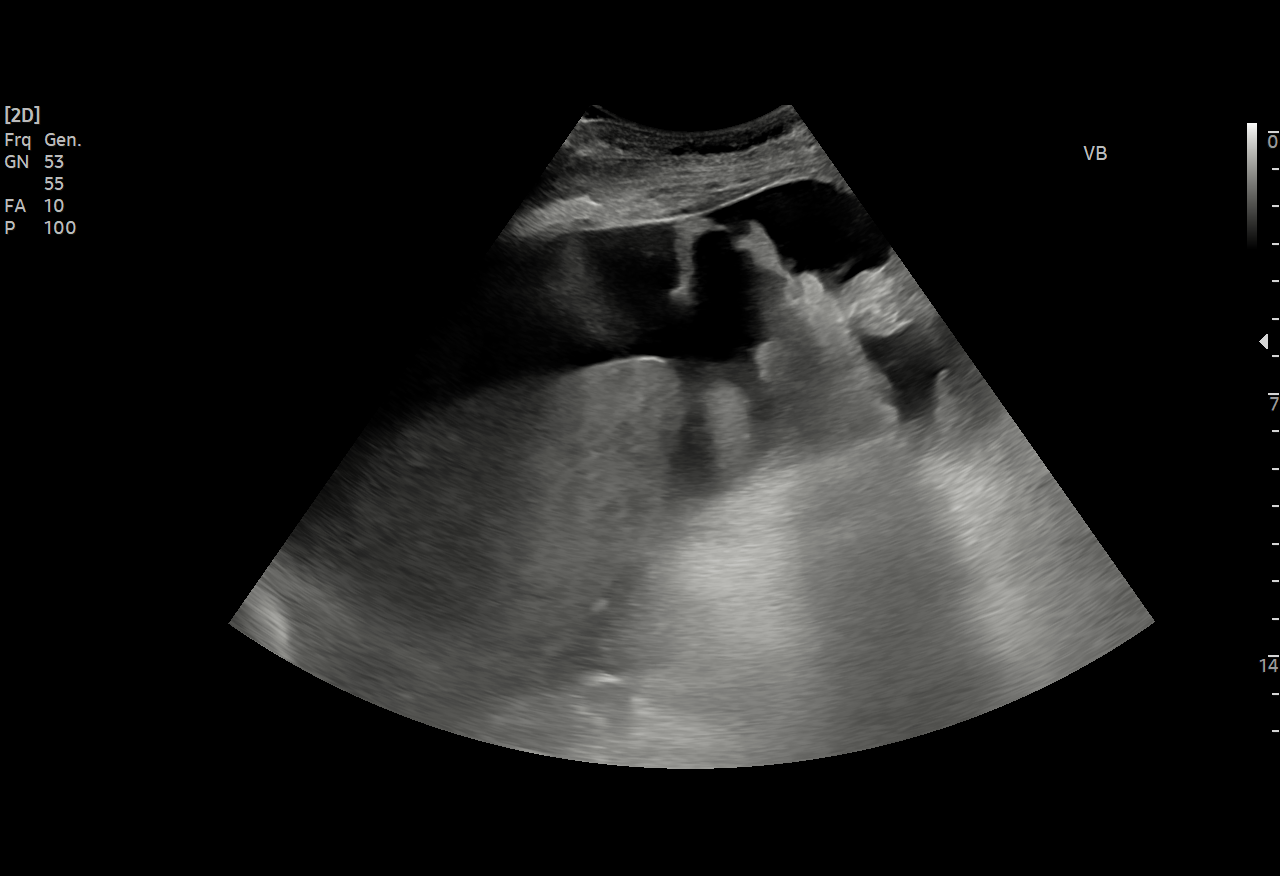
[im 2/4]
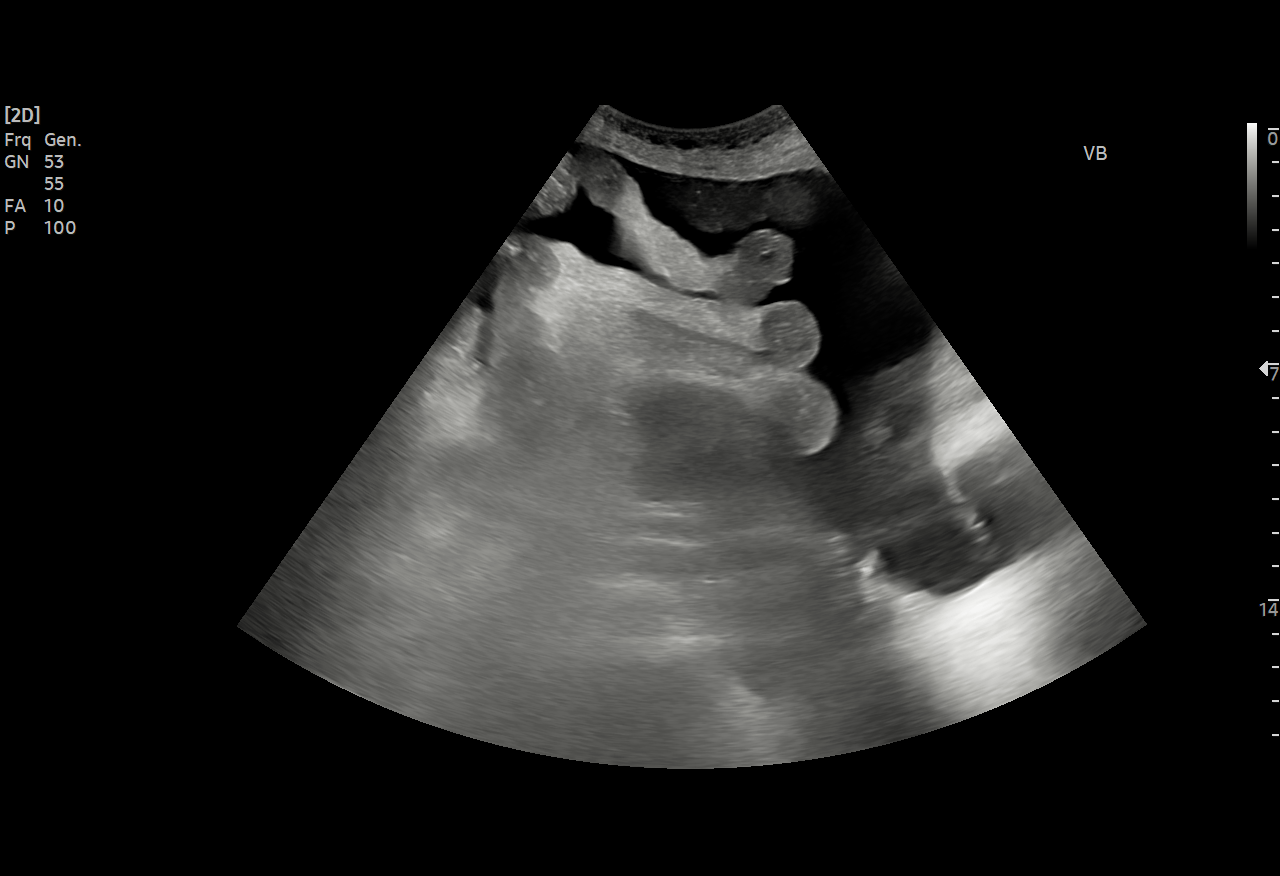
[im 3/4]
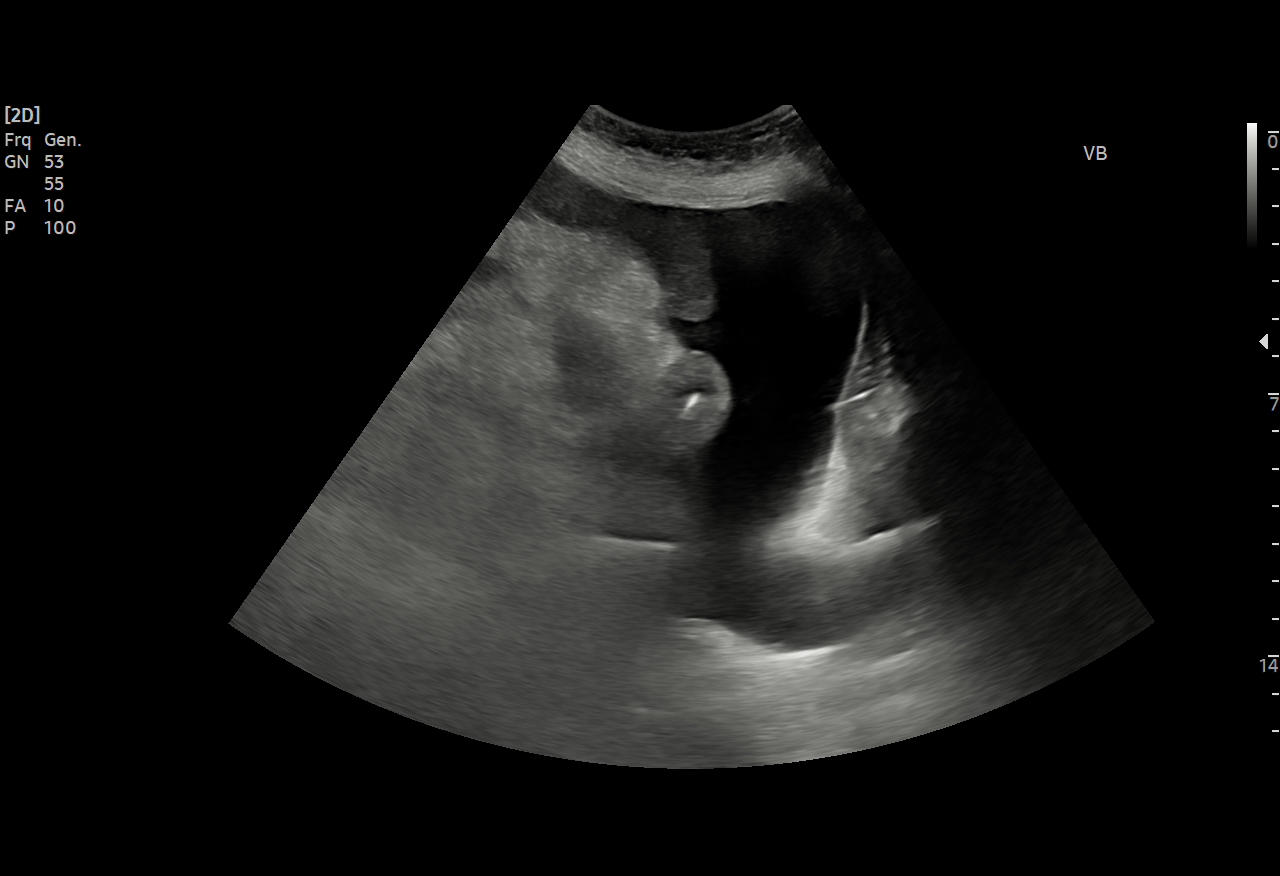
[im 4/4]
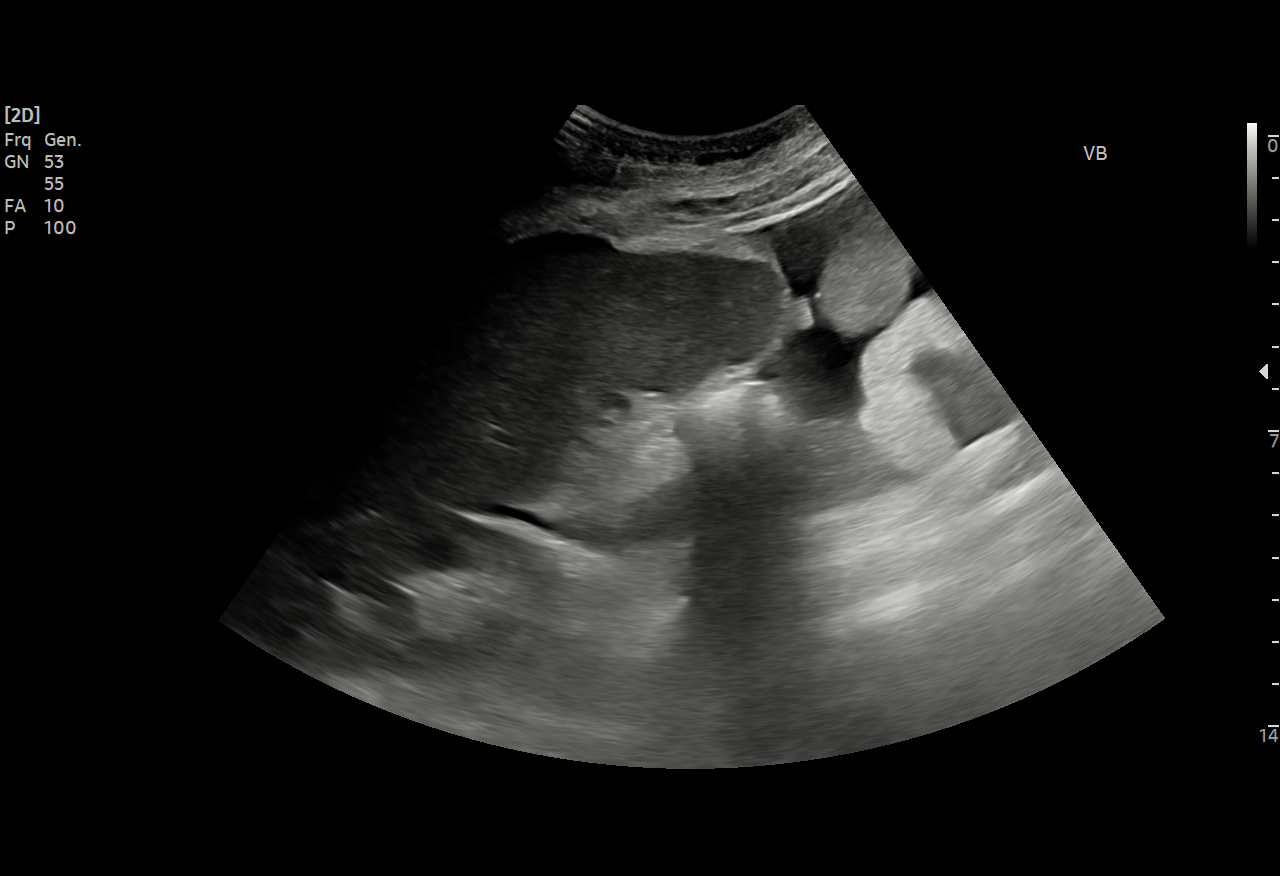

[4 of 4 positions shown; findings below may reference images not displayed]

FINDINGS: Mild ascites noted in all 4 quadrants, significantly decreased since
prior CT.
IMPRESSION: Mild abdominal ascites.

## 2023-04-24 IMAGING — DX DG CHEST 2V
2 series · 2 of 2 positions shown · non-contrast
Comparison: 05/20/2021

CLINICAL DATA: CHF

EXAM:
CHEST - 2 VIEW

[chest lat]
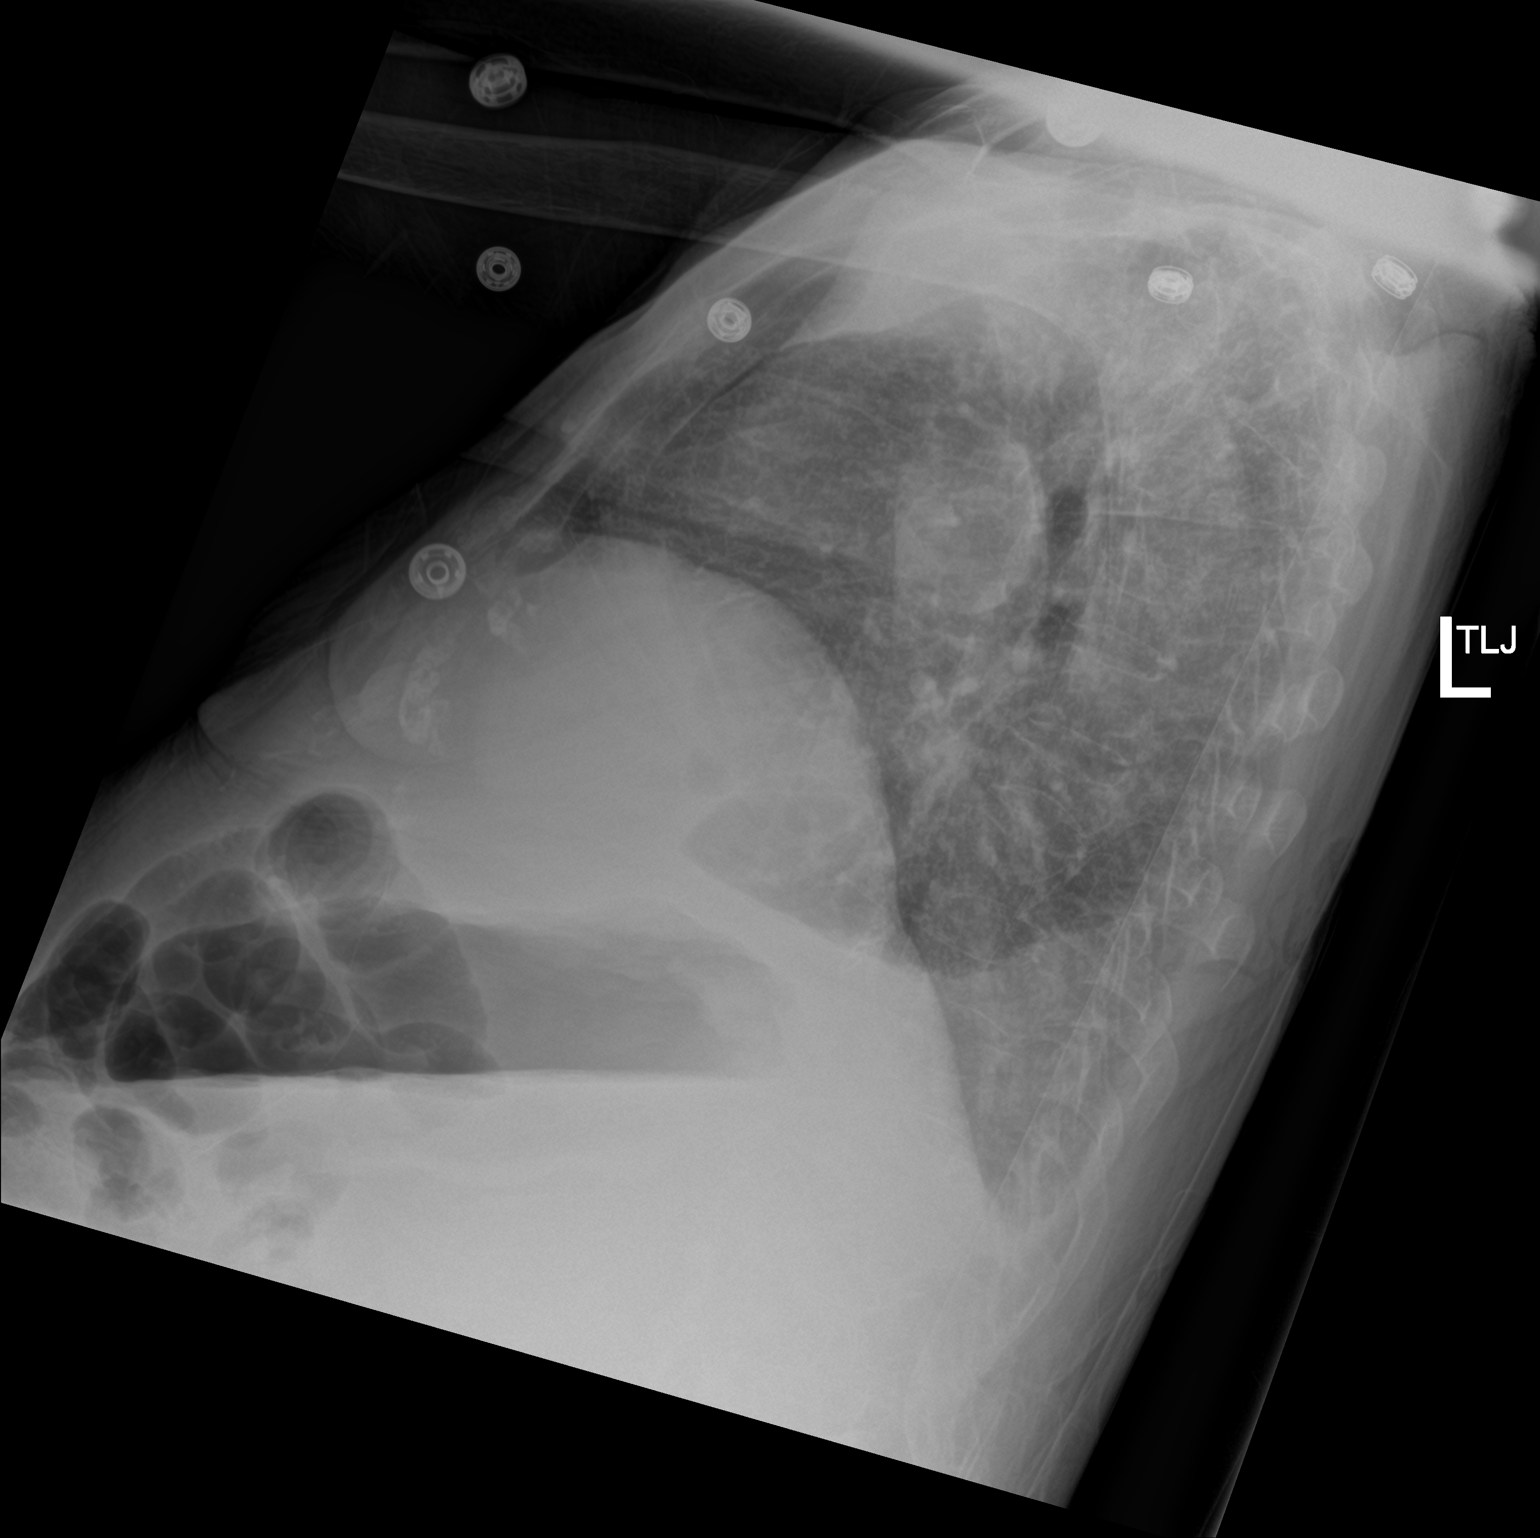

[chest ap]
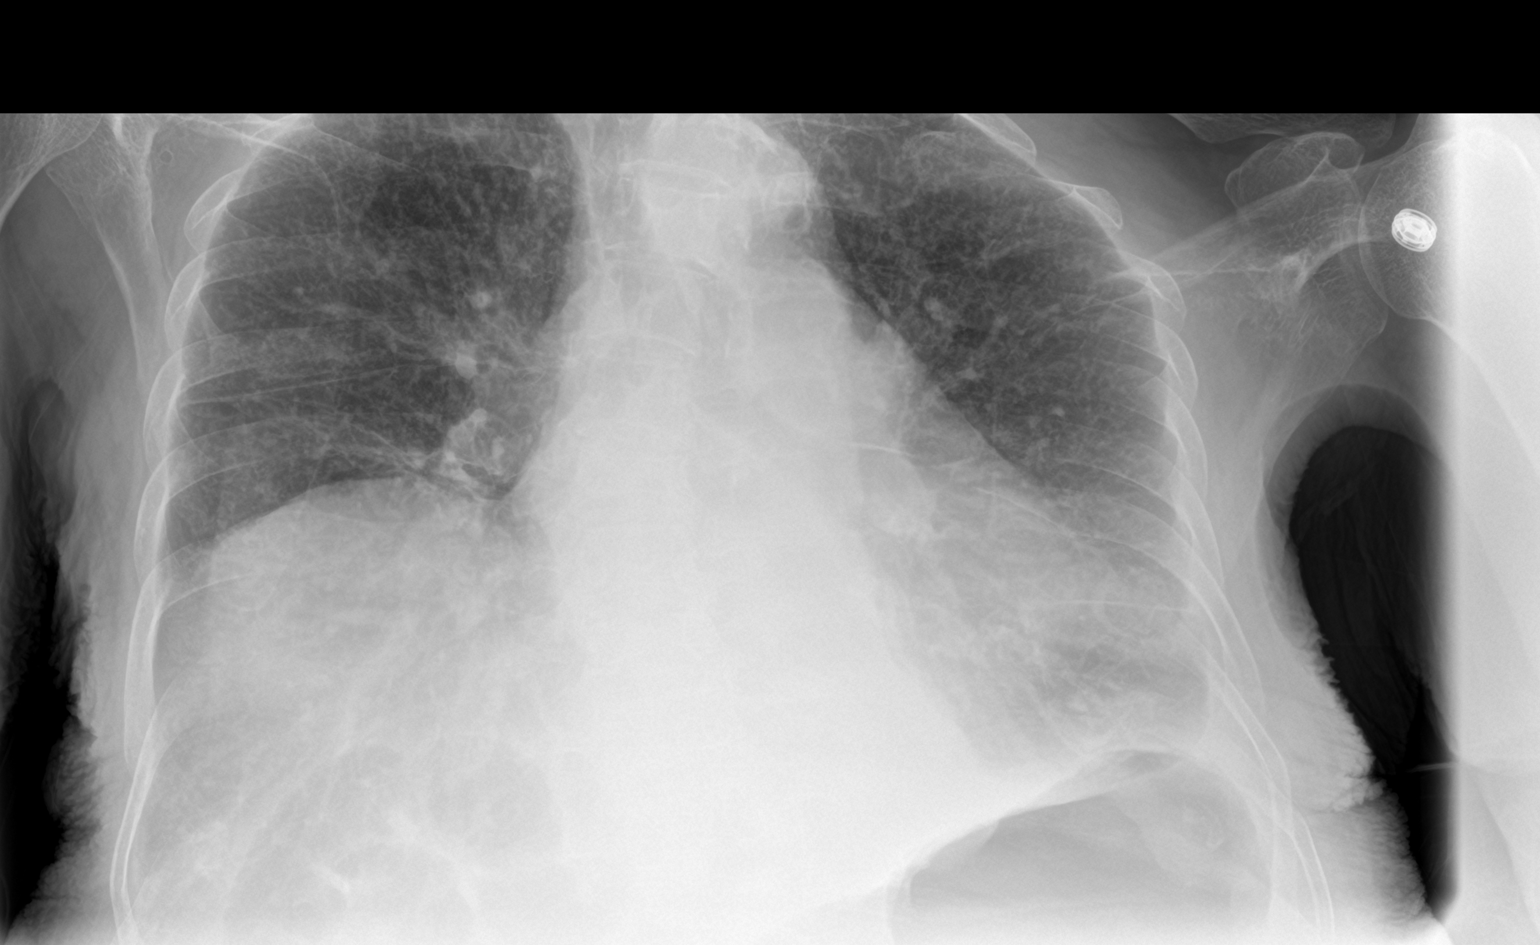

[2 of 2 positions shown; findings below may reference images not displayed]

FINDINGS: Low lung volumes. Eventration of the right hemidiaphragm. Similar
interstitial changes. Small left pleural effusion with left basilar
atelectasis. Stable cardiomediastinal contours. Diffusely decreased
osseous mineralization. Age-indeterminate thoracic compression
fractures some of which were imaged on Abrahams CT abdomen.
IMPRESSION: Small left pleural effusion with left basilar atelectasis.
Age-indeterminate interstitial changes. A component of interstitial
edema is difficult to exclude.

## 2023-04-29 ENCOUNTER — Other Ambulatory Visit (HOSPITAL_COMMUNITY): Payer: Self-pay

## 2023-05-01 ENCOUNTER — Other Ambulatory Visit (HOSPITAL_COMMUNITY): Payer: Self-pay

## 2023-05-05 NOTE — Progress Notes (Signed)
Internal Medicine Clinic Attending  Case discussed with the resident at the time of the visit.  We reviewed the resident's history and exam and pertinent patient test results.  I agree with the assessment, diagnosis, and plan of care documented in the resident's note.  

## 2023-05-08 NOTE — Progress Notes (Signed)
Internal Medicine Clinic Attending  Case discussed with the resident at the time of the visit.  We reviewed the resident's history and exam and pertinent patient test results.  I agree with the assessment, diagnosis, and plan of care documented in the resident's note.  

## 2023-07-16 ENCOUNTER — Other Ambulatory Visit (HOSPITAL_COMMUNITY): Payer: Self-pay

## 2023-07-17 ENCOUNTER — Other Ambulatory Visit (HOSPITAL_COMMUNITY): Payer: Self-pay

## 2023-08-04 ENCOUNTER — Other Ambulatory Visit (HOSPITAL_COMMUNITY): Payer: Self-pay

## 2023-08-04 ENCOUNTER — Ambulatory Visit (INDEPENDENT_AMBULATORY_CARE_PROVIDER_SITE_OTHER): Payer: Self-pay | Admitting: Internal Medicine

## 2023-08-04 VITALS — BP 122/64 | HR 70 | Temp 98.1°F | Ht 62.0 in | Wt 96.6 lb

## 2023-08-04 DIAGNOSIS — Z5971 Insufficient health insurance coverage: Secondary | ICD-10-CM

## 2023-08-04 DIAGNOSIS — E875 Hyperkalemia: Secondary | ICD-10-CM

## 2023-08-04 DIAGNOSIS — Z7189 Other specified counseling: Secondary | ICD-10-CM

## 2023-08-04 DIAGNOSIS — R188 Other ascites: Secondary | ICD-10-CM

## 2023-08-04 DIAGNOSIS — K743 Primary biliary cirrhosis: Secondary | ICD-10-CM

## 2023-08-04 DIAGNOSIS — I5020 Unspecified systolic (congestive) heart failure: Secondary | ICD-10-CM

## 2023-08-04 DIAGNOSIS — K8309 Other cholangitis: Secondary | ICD-10-CM

## 2023-08-04 DIAGNOSIS — K7460 Unspecified cirrhosis of liver: Secondary | ICD-10-CM

## 2023-08-04 LAB — PROTIME-INR
INR: 1 (ref 0.8–1.2)
Prothrombin Time: 13.5 s (ref 11.4–15.2)

## 2023-08-04 MED ORDER — PANTOPRAZOLE SODIUM 40 MG PO TBEC
40.0000 mg | DELAYED_RELEASE_TABLET | Freq: Every day | ORAL | 3 refills | Status: DC
Start: 2023-08-04 — End: 2023-10-09
  Filled 2023-08-04 – 2023-10-09 (×2): qty 90, 90d supply, fill #0

## 2023-08-04 NOTE — Patient Instructions (Addendum)
 Thank you, Hannah Blake for allowing us  to provide your care today.   I am glad you are not having symptoms from liver thickening or cirrhosis. You are at high risk of several complications from this. I think it would be good to have you follow-up with GI and repeat ultrasound of your liver. I understand that cost and coverage is important. I have placed a referral to social work to see if they have other suggestions for how to help get some coverage for you.  Heart failure: In the future it would be good to repeat a heart ultrasound as well.  I have ordered the following labs for you:   Lab Orders         Protime-INR         CMP14 + Anion Gap         CBC no Diff      Referrals ordered today:    Referral Orders         Ambulatory referral to Social Work      I have ordered the following medication/changed the following medications:   Stop the following medications: There are no discontinued medications.   Start the following medications: No orders of the defined types were placed in this encounter.    Follow up: 3 months  We look forward to seeing you next time. Please call our clinic at [phone] if you have any questions or concerns. The best time to call is Monday-Friday from 9am-4pm, but there is someone available 24/7. If after hours or the weekend, call the main hospital number and ask for the Internal Medicine Resident On-Call. If you need medication refills, please notify your pharmacy one week in advance and they will send us  a request.   Thank you for trusting me with your care. Wishing you the best!   Izetta Medley, DO [facility]

## 2023-08-04 NOTE — Progress Notes (Addendum)
 Subjective:  CC: follow-up  HPI:  Hannah Blake is a 73 y.o. female with a past medical history of PBC c/b hepatic cirrhosis with varices, HFrEF EF 40-45%, and osteoporosis who presents today for follow-up.   Please see problem based assessment and plan for additional details.  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(HCC)     Current Outpatient Medications on File Prior to Visit  Medication Sig Dispense Refill   artificial tears (LACRILUBE) OINT ophthalmic ointment Place into both eyes at bedtime as needed for dry eyes. 3.5 g 1   carvedilol  (COREG ) 3.125 MG tablet Take 1 tablet (3.125 mg total) by mouth 2 (two) times daily. 90 tablet 4   docusate sodium  (COLACE) 100 MG capsule Take 1 capsule (100 mg total) by mouth 2 (two) times daily as needed for mild constipation. 30 capsule 4   furosemide  (LASIX ) 20 MG tablet Take 1 tablet (20 mg total) by mouth daily. 90 tablet 2   lidocaine  (LIDODERM ) 5 % Place 1 patch onto the skin daily. Remove & Discard patch within 12 hours or as directed by MD 5 patch 0   mirtazapine  (REMERON ) 7.5 MG tablet Take 1 tablet (7.5 mg total) by mouth at bedtime. 30 tablet 1   spironolactone  (ALDACTONE ) 50 MG tablet Take 1 tablet (50 mg total) by mouth daily. 90 tablet 2   ursodiol  (ACTIGALL ) 300 MG capsule Take 2 capsules (600 mg total) by mouth 2 (two) times daily. 120 capsule 3   No current facility-administered medications on file prior to visit.    Family History  Family history unknown: Yes    Social History   Socioeconomic History   Marital status: Single    Spouse name: Not on file   Number of children: Not on file   Years of education: Not on file   Highest education level: Not on file  Occupational History   Not on file  Tobacco Use   Smoking status: Every Day    Current packs/day: 0.25     Average packs/day: 0.3 packs/day for 40.0 years (10.0 ttl pk-yrs)    Types: Cigarettes   Smokeless tobacco: Never   Tobacco comments:    3 cigs per day  Vaping Use   Vaping status: Never Used  Substance and Sexual Activity   Alcohol use: Not Currently    Comment: weekly   Drug use: Never   Sexual activity: Not on file  Other Topics Concern   Not on file  Social History Narrative   Not on file   Social Drivers of Health   Financial Resource Strain: Not on file  Food Insecurity: No Food Insecurity (11/12/2022)   Hunger Vital Sign    Worried About Running Out of Food in the Last Year: Never true    Ran Out of Food in the Last Year: Never true  Transportation Needs: No Transportation Needs (11/12/2022)   PRAPARE - Administrator, Civil Service (Medical): No    Lack of Transportation (Non-Medical): No  Physical Activity: Not on file  Stress: Not on file  Social Connections: Moderately Isolated (11/12/2022)   Social Connection and Isolation Panel [NHANES]    Frequency of Communication with Friends and Family: More than three times a week    Frequency of Social Gatherings with Friends and  Family: More than three times a week    Attends Religious Services: Never    Active Member of Clubs or Organizations: Yes    Attends Banker Meetings: Never    Marital Status: Never married  Intimate Partner Violence: Not At Risk (11/12/2022)   Humiliation, Afraid, Rape, and Kick questionnaire    Fear of Current or Ex-Partner: No    Emotionally Abused: No    Physically Abused: No    Sexually Abused: No    Review of Systems: ROS negative except for what is noted on the assessment and plan.  Objective:   Vitals:   08/04/23 1003  BP: 122/64  Pulse: 70  Temp: 98.1 F (36.7 C)  TempSrc: Oral  SpO2: 100%  Weight: 96 lb 9.6 oz (43.8 kg)  Height: 5' 2 (1.575 m)    Physical Exam: Constitutional: sitting in wheelchair, in no acute distress Cardiovascular: regular  rate and rhythm, no m/r/g Pulmonary/Chest: normal work of breathing on room air, lungs clear to auscultation bilaterally Abdominal: soft, non-tender, no distension  MSK: no lower extremity edema Neurological: alert & oriented x 3, 5/5 strength in bilateral upper and lower extremities, normal gait Skin: warm and dry   Assessment & Plan:  Hepatic cirrhosis (HCC) She was diagnosed with cirrhosis 2/2 to PBC in 12/2021 when she presented with hematemesis and was found to have 2 large esophageal varices at that time which were banded. She has had paracentesis in the past but has not required one in 9/23. She feels well and is able to complete all iADLs. For her medications, she is not able to read names but her grandson states that she is diligent about taking them daily. They are not able to afford urosodiol as it costs >$100 monthly. She has not established care with GI yet as insurance is a barrier.  I talked with her and her grandson about GI helping with monitoring for varices and other complications of cirrhosis. We also talked about RUQ U/S screening recommendations as her risk of HCC is increased. [name] feels asymptomatic and is hesitant to spend money on screening and monitoring at this time. P: Ok with having labs completed, CMP, CBC, INR - blood count stable, alk phos from 400s to 600s. MELD-NA of 9, child pugh 6. RUQ U/S priced at >$200. They would like to hold off on this for now Social work referral placed for insurance help.   Heart failure with reduced ejection fraction (HCC) She was found to have HFrEF in 2022 during hospital admission she was found to have cirrhosis. She was recommended to have an ischemic evaluation as outpatient, but has not followed up with cardiology. I talked with her and her grandson, [name] about referral to cardiology and they would like to defer at this time. Medications include coreg  and spironolactone , which she is tolerating. P: Continue coreg  and  spironolactone . BP 122/64, could consider ACE/ARB, however would want to make sure this fits within patients goals of care.  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(HCC) Difficulty affording ursodiol . She has not been taking for some time. ALK phos from 400 to 600s. Ideally would have her follow with hepatologist. Additional lab evaluation recommendations include vitamin A  and D annually, TSH annually.  Goals of care, counseling/discussion When she presented with hematemesis in 2022, she was in hemorrhagic shock and required ICU admission. She was intubated with need for emergent EGD and self-extubated. I do think at next office visit it would be important to review her goals  of care. She is at risk for a similar admission with difficulty accessing care.  Hyperkalemia CMP with K of 5.4 and bicarb of 19. She is taking spironolactone  50 mg and lasix  20 mg. I will ask her grandson to follow-up in person for lab only visit next week. -BMP  Patient discussed with Dr. Forest Pagan Joshual Terrio, D.O. [facility] Internal Medicine  PGY-3 Pager: [phone]  Phone: [phone] Date 08/05/2023  Time 8:46 AM

## 2023-08-05 LAB — CBC
Hematocrit: 32.4 % — ABNORMAL LOW (ref 34.0–46.6)
Hemoglobin: 10.6 g/dL — ABNORMAL LOW (ref 11.1–15.9)
MCH: 29.8 pg (ref 26.6–33.0)
MCHC: 32.7 g/dL (ref 31.5–35.7)
MCV: 91 fL (ref 79–97)
Platelets: 169 10*3/uL (ref 150–450)
RBC: 3.56 x10E6/uL — ABNORMAL LOW (ref 3.77–5.28)
RDW: 12.1 % (ref 11.7–15.4)
WBC: 5.8 10*3/uL (ref 3.4–10.8)

## 2023-08-05 LAB — CMP14 + ANION GAP
ALT: 26 [IU]/L (ref 0–32)
AST: 71 [IU]/L — ABNORMAL HIGH (ref 0–40)
Albumin: 3.9 g/dL (ref 3.8–4.8)
Alkaline Phosphatase: 647 [IU]/L — ABNORMAL HIGH (ref 44–121)
Anion Gap: 14 mmol/L (ref 10.0–18.0)
BUN/Creatinine Ratio: 24 (ref 12–28)
BUN: 21 mg/dL (ref 8–27)
Bilirubin Total: 1.9 mg/dL — ABNORMAL HIGH (ref 0.0–1.2)
CO2: 19 mmol/L — ABNORMAL LOW (ref 20–29)
Calcium: 9.3 mg/dL (ref 8.7–10.3)
Chloride: 104 mmol/L (ref 96–106)
Creatinine, Ser: 0.89 mg/dL (ref 0.57–1.00)
Globulin, Total: 4.4 g/dL (ref 1.5–4.5)
Glucose: 86 mg/dL (ref 70–99)
Potassium: 5.4 mmol/L — ABNORMAL HIGH (ref 3.5–5.2)
Sodium: 137 mmol/L (ref 134–144)
Total Protein: 8.3 g/dL (ref 6.0–8.5)
eGFR: 68 mL/min/{1.73_m2} (ref >59)

## 2023-08-05 NOTE — Assessment & Plan Note (Signed)
 CMP with K of 5.4 and bicarb of 19. She is taking spironolactone  50 mg and lasix  20 mg. I will ask her grandson to follow-up in person for lab only visit next week. -BMP

## 2023-08-05 NOTE — Assessment & Plan Note (Addendum)
 Difficulty affording ursodiol . She has not been taking for some time. ALK phos from 400 to 600s. Ideally would have her follow with hepatologist. Additional lab evaluation recommendations include vitamin A  and D annually, TSH annually.

## 2023-08-05 NOTE — Assessment & Plan Note (Addendum)
" >>  ASSESSMENT AND PLAN FOR DECOMPENSATED HEPATIC CIRRHOSIS (HCC) WRITTEN ON 08/05/2023  8:42 AM BY Lavonda Thal, DO  She was diagnosed with cirrhosis 2/2 to PBC in 12/2021 when she presented with hematemesis and was found to have 2 large esophageal varices at that time which were banded. She has had paracentesis in the past but has not required one in 9/23. She feels well and is able to complete all iADLs. For her medications, she is not able to read names but her grandson states that she is diligent about taking them daily. They are not able to afford urosodiol as it costs >$100 monthly. She has not established care with GI yet as insurance is a barrier.  I talked with her and her grandson about GI helping with monitoring for varices and other complications of cirrhosis. We also talked about RUQ U/S screening recommendations as her risk of HCC is increased. Ms. Vanlue feels asymptomatic and is hesitant to spend money on screening and monitoring at this time. P: Ok with having labs completed, CMP, CBC, INR - blood count stable, alk phos from 400s to 600s. MELD-NA of 9, child pugh 6. RUQ U/S priced at >$200. They would like to hold off on this for now Social work referral placed for insurance help.    >>ASSESSMENT AND PLAN FOR PRIMARY BILIARY CHOLANGITIS (HCC) WRITTEN ON 08/05/2023  8:46 AM BY Thetis Schwimmer, DO  Difficulty affording ursodiol . She has not been taking for some time. ALK phos from 400 to 600s. Ideally would have her follow with hepatologist. Additional lab evaluation recommendations include vitamin A  and D annually, TSH annually. "

## 2023-08-05 NOTE — Assessment & Plan Note (Signed)
 When she presented with hematemesis in 2022, she was in hemorrhagic shock and required ICU admission. She was intubated with need for emergent EGD and self-extubated. I do think at next office visit it would be important to review her goals of care. She is at risk for a similar admission with difficulty accessing care.

## 2023-08-05 NOTE — Assessment & Plan Note (Signed)
 She was found to have HFrEF in 2022 during hospital admission she was found to have cirrhosis. She was recommended to have an ischemic evaluation as outpatient, but has not followed up with cardiology. I talked with her and her grandson, Bambi Lever about referral to cardiology and they would like to defer at this time. Medications include coreg  and spironolactone , which she is tolerating. P: Continue coreg  and spironolactone . BP 122/64, could consider ACE/ARB, however would want to make sure this fits within patients goals of care.

## 2023-08-11 ENCOUNTER — Other Ambulatory Visit: Payer: Medicaid Other

## 2023-08-12 NOTE — Progress Notes (Signed)
 Internal Medicine Clinic Attending  Case discussed with the resident at the time of the visit.  We reviewed the resident's history and exam and pertinent patient test results.  I agree with the assessment, diagnosis, and plan of care documented in the resident's note.

## 2023-08-14 ENCOUNTER — Other Ambulatory Visit (HOSPITAL_COMMUNITY): Payer: Self-pay

## 2023-08-14 ENCOUNTER — Other Ambulatory Visit (INDEPENDENT_AMBULATORY_CARE_PROVIDER_SITE_OTHER): Payer: Self-pay

## 2023-08-14 DIAGNOSIS — E875 Hyperkalemia: Secondary | ICD-10-CM

## 2023-08-15 LAB — BMP8+ANION GAP
Anion Gap: 15 mmol/L (ref 10.0–18.0)
BUN/Creatinine Ratio: 19 (ref 12–28)
BUN: 19 mg/dL (ref 8–27)
CO2: 18 mmol/L — ABNORMAL LOW (ref 20–29)
Calcium: 9 mg/dL (ref 8.7–10.3)
Chloride: 104 mmol/L (ref 96–106)
Creatinine, Ser: 1 mg/dL (ref 0.57–1.00)
Glucose: 123 mg/dL — ABNORMAL HIGH (ref 70–99)
Potassium: 4.8 mmol/L (ref 3.5–5.2)
Sodium: 137 mmol/L (ref 134–144)
eGFR: 59 mL/min/{1.73_m2} — ABNORMAL LOW (ref >59)

## 2023-08-20 ENCOUNTER — Institutional Professional Consult (permissible substitution): Payer: Self-pay | Admitting: Licensed Clinical Social Worker

## 2023-08-27 ENCOUNTER — Ambulatory Visit (INDEPENDENT_AMBULATORY_CARE_PROVIDER_SITE_OTHER): Payer: Self-pay | Admitting: Licensed Clinical Social Worker

## 2023-08-27 DIAGNOSIS — Z7189 Other specified counseling: Secondary | ICD-10-CM

## 2023-08-27 NOTE — BH Specialist Note (Signed)
 Integrated Behavioral Health Initial In-Person Visit  MRN: [id number] Name: Hannah Blake  Number of Integrated Behavioral Health Clinician visits: 1- Initial Visit  Session Start time: 1015    Session End time: 1045  Total time in minutes: 30   Types of Service: Introduction only and Other (Comment) Insurance needs  Interpretor:Yes.   Interpretor Name and Language: Vietnamese    Warm Hand Off Completed.        Subjective: Hannah Blake is a 73 y.o. female accompanied by  Female family member Patient was referred by PCP for Insurance barriers. Patient reports the following symptoms/concerns: Patient presented to the office accompanied by an interpreter and a female family member to discuss the denial of Medicaid and their current uninsured status. [facility] introduced herself and patient agreed to session. The Behavioral Health Consultant [facility]) provided comprehensive education on Medicaid requirements and demonstrated how to navigate the policies and requirements on the [facility] website. To further assist the patient, the [facility] made a referral to the [facility] with senior resources. Specific contact information was provided, including Maude Shaver Estate Agent) and Firefighter (Vietnamese & Engineer, Water). The patient was given phone numbers for [facility] and [place] [phone]) and other areas [phone]), as well as an email address ([email or web address]-[email or web address]) for additional support. The [facility] also informed the patient about Http://www.long-jenkins.com/ as a theatre stage manager for [employer] The patient expressed gratitude for the assistance, stating, This is the most help we've received. The patient appeared satisfied with the services provided and indicated they would contact the [facility] if further assistance was needed. [facility] also discussed FNS benefits ( [employer]) Utility assistance , Elder Law and housing.   Duration of problem: 2 years; Severity  of problem: moderate  Objective: Mood: NA and Affect: Appropriate Risk of harm to self or others: No plan to harm self or others   Patient and/or Family's Strengths/Protective Factors: Social connections  Goals Addressed: Patient will: Reduce symptoms of:  Lack of Health Insurance    Progress towards Goals: Achieved  Interventions: Interventions utilized: Link to [employer]  Standardized Assessments completed: Not Needed  Patient and/or Family Response: Patient denied needing BH services. Patient in need of Health Insurance    Assessment: Patient currently experiencing without health coverage.   Patient may benefit from Elder law attorney, [employer] or adult nurse.  Plan: Follow up with behavioral health clinician on : No Follow up needed  [name], MSW, LCSW-A She/Her Behavioral Health Clinician [facility]  Internal Medicine Center Direct Dial:[phone]  Fax [phone] Main Office Phone: [phone] [address] [place] [place], [place] [address] Website: [facility] Internal Medicine [facility]  [place], [place]  [place]

## 2023-10-09 ENCOUNTER — Other Ambulatory Visit (HOSPITAL_COMMUNITY): Payer: Self-pay

## 2023-10-09 ENCOUNTER — Other Ambulatory Visit: Payer: Self-pay | Admitting: Student

## 2023-10-09 DIAGNOSIS — F5101 Primary insomnia: Secondary | ICD-10-CM

## 2023-10-09 DIAGNOSIS — Z8669 Personal history of other diseases of the nervous system and sense organs: Secondary | ICD-10-CM

## 2023-10-09 DIAGNOSIS — K5904 Chronic idiopathic constipation: Secondary | ICD-10-CM

## 2023-10-09 DIAGNOSIS — R188 Other ascites: Secondary | ICD-10-CM

## 2023-10-09 NOTE — Telephone Encounter (Signed)
 Copied from CRM [id number]. Topic: Clinical - Medication Refill >> Oct 09, 2023  1:44 PM Brandy W wrote: Most Recent Primary Care Visit:  Provider: Rich Reining  Department: [facility]  Visit Type: INTEGRATED BH CONSULT 60  Date: 08/27/2023  Medication: ursodiol (ACTIGALL) 300 MG capsule  spironolactone (ALDACTONE) 50 MG tablet  pantoprazole (PROTONIX) 40 MG tablet mirtazapine (REMERON) 7.5 MG tablet lidocaine (LIDODERM) 5 % furosemide (LASIX) 20 MG tablet docusate sodium (COLACE) 100 MG capsule carvedilol (COREG) 3.125 MG tablet artificial tears (LACRILUBE) OINT ophthalmic ointment  Has the patient contacted their pharmacy? No Grandson stated he was calling in the refills since she is out of some and almost out of others  Is this the correct pharmacy for this prescription? Yes If no, delete pharmacy and type the correct one.  This is the patient's preferred pharmacy:  [facility] [facility] [address] [place] [address] Phone: [phone] Fax: [phone]    Has the prescription been filled recently? No  Is the patient out of the medication? Yes  Has the patient been seen for an appointment in the last year OR does the patient have an upcoming appointment? Yes  Can we respond through MyChart? Yes  Agent: Please be advised that Rx refills may take up to 3 business days. We ask that you follow-up with your pharmacy.

## 2023-10-12 ENCOUNTER — Other Ambulatory Visit (HOSPITAL_COMMUNITY): Payer: Self-pay

## 2023-10-12 MED ORDER — MIRTAZAPINE 7.5 MG PO TABS
7.5000 mg | ORAL_TABLET | Freq: Every day | ORAL | 0 refills | Status: DC
  Filled 2023-10-12: qty 30, 30d supply, fill #0

## 2023-10-12 MED ORDER — LIDOCAINE 5 % EX PTCH
1.0000 | MEDICATED_PATCH | CUTANEOUS | 0 refills | Status: DC
  Filled 2023-10-12: qty 5, 5d supply, fill #0

## 2023-10-12 MED ORDER — SPIRONOLACTONE 50 MG PO TABS
50.0000 mg | ORAL_TABLET | Freq: Every day | ORAL | 2 refills | Status: DC
  Filled 2023-10-12: qty 90, 90d supply, fill #0

## 2023-10-12 MED ORDER — PANTOPRAZOLE SODIUM 40 MG PO TBEC
40.0000 mg | DELAYED_RELEASE_TABLET | Freq: Every day | ORAL | 0 refills | Status: DC
  Filled 2023-10-12: qty 90, 90d supply, fill #0

## 2023-10-12 MED ORDER — DOCUSATE SODIUM 100 MG PO CAPS
100.0000 mg | ORAL_CAPSULE | Freq: Two times a day (BID) | ORAL | 0 refills | Status: DC | PRN
  Filled 2023-10-12: qty 30, 15d supply, fill #0

## 2023-10-12 MED ORDER — MIRTAZAPINE 7.5 MG PO TABS
7.5000 mg | ORAL_TABLET | Freq: Every day | ORAL | 1 refills | Status: DC
  Filled 2023-10-12: qty 30, 30d supply, fill #0

## 2023-10-12 MED ORDER — FUROSEMIDE 20 MG PO TABS
20.0000 mg | ORAL_TABLET | Freq: Every day | ORAL | 0 refills | Status: DC
  Filled 2023-10-12: qty 90, 90d supply, fill #0

## 2023-10-12 MED ORDER — ARTIFICIAL TEARS OPHTHALMIC OINT
TOPICAL_OINTMENT | Freq: Every evening | OPHTHALMIC | 0 refills | Status: DC | PRN
  Filled 2023-10-12: qty 3.5, 28d supply, fill #0

## 2023-10-12 MED ORDER — URSODIOL 300 MG PO CAPS
600.0000 mg | ORAL_CAPSULE | Freq: Two times a day (BID) | ORAL | 0 refills | Status: DC
  Filled 2023-10-12 – 2023-10-13 (×3): qty 120, 30d supply, fill #0

## 2023-10-12 MED ORDER — CARVEDILOL 3.125 MG PO TABS
3.1250 mg | ORAL_TABLET | Freq: Two times a day (BID) | ORAL | 0 refills | Status: DC
  Filled 2023-10-12: qty 60, 30d supply, fill #0

## 2023-10-12 NOTE — Telephone Encounter (Signed)
 Medication sent to pharmacy

## 2023-10-13 ENCOUNTER — Other Ambulatory Visit (HOSPITAL_COMMUNITY): Payer: Self-pay

## 2023-10-22 ENCOUNTER — Other Ambulatory Visit (HOSPITAL_COMMUNITY): Payer: Self-pay

## 2023-11-25 ENCOUNTER — Encounter: Payer: Self-pay | Admitting: Internal Medicine

## 2023-11-25 ENCOUNTER — Ambulatory Visit (INDEPENDENT_AMBULATORY_CARE_PROVIDER_SITE_OTHER): Payer: Self-pay | Admitting: Internal Medicine

## 2023-11-25 ENCOUNTER — Other Ambulatory Visit (HOSPITAL_COMMUNITY): Payer: Self-pay

## 2023-11-25 VITALS — BP 114/60 | HR 63 | Temp 97.9°F | Ht 62.0 in | Wt 94.5 lb

## 2023-11-25 DIAGNOSIS — D509 Iron deficiency anemia, unspecified: Secondary | ICD-10-CM

## 2023-11-25 DIAGNOSIS — I5020 Unspecified systolic (congestive) heart failure: Secondary | ICD-10-CM

## 2023-11-25 DIAGNOSIS — E46 Unspecified protein-calorie malnutrition: Secondary | ICD-10-CM

## 2023-11-25 DIAGNOSIS — Z7189 Other specified counseling: Secondary | ICD-10-CM

## 2023-11-25 DIAGNOSIS — K219 Gastro-esophageal reflux disease without esophagitis: Secondary | ICD-10-CM

## 2023-11-25 DIAGNOSIS — Z8669 Personal history of other diseases of the nervous system and sense organs: Secondary | ICD-10-CM

## 2023-11-25 DIAGNOSIS — E559 Vitamin D deficiency, unspecified: Secondary | ICD-10-CM

## 2023-11-25 DIAGNOSIS — E43 Unspecified severe protein-calorie malnutrition: Secondary | ICD-10-CM

## 2023-11-25 DIAGNOSIS — E509 Vitamin A deficiency, unspecified: Secondary | ICD-10-CM

## 2023-11-25 DIAGNOSIS — K7460 Unspecified cirrhosis of liver: Secondary | ICD-10-CM

## 2023-11-25 DIAGNOSIS — D649 Anemia, unspecified: Secondary | ICD-10-CM

## 2023-11-25 DIAGNOSIS — E875 Hyperkalemia: Secondary | ICD-10-CM

## 2023-11-25 DIAGNOSIS — K7290 Hepatic failure, unspecified without coma: Secondary | ICD-10-CM

## 2023-11-25 DIAGNOSIS — K743 Primary biliary cirrhosis: Secondary | ICD-10-CM

## 2023-11-25 DIAGNOSIS — Z23 Encounter for immunization: Secondary | ICD-10-CM

## 2023-11-25 MED ORDER — ARTIFICIAL TEARS OPHTHALMIC OINT
TOPICAL_OINTMENT | Freq: Every evening | OPHTHALMIC | 0 refills | Status: AC | PRN
  Filled 2023-11-25: qty 3.5, 7d supply, fill #0

## 2023-11-25 MED ORDER — LACTULOSE 20 GM/30ML PO SOLN
15.0000 mL | Freq: Two times a day (BID) | ORAL | 11 refills | Status: DC | PRN
  Filled 2023-11-25: qty 450, 15d supply, fill #0
  Filled 2024-05-19: qty 473, 16d supply, fill #0

## 2023-11-25 NOTE — Patient Instructions (Addendum)
 [name], it was a pleasure seeing you today! You endorsed feeling well today. Below are some of the things we talked about this visit. We look forward to seeing you in the follow up appointment!  Today we discussed: Please take your medicines regularly.   Work on trying to get medicaid as you need to see a liver doctor.   We will give you a pneumonia vaccine.   I have ordered the following labs today:  Lab Orders         CMP14 + Anion Gap         Vitamin D  (25 hydroxy)         Vitamin A          Iron, TIBC and Ferritin Panel        Referrals ordered today:   Referral Orders  No referral(s) requested today     I have ordered the following medication/changed the following medications:   Stop the following medications: Medications Discontinued During This Encounter  Medication Reason   mirtazapine  (REMERON ) 7.5 MG tablet Duplicate   docusate sodium  (COLACE) 100 MG capsule Discontinued by provider   artificial tears (LACRILUBE) OINT ophthalmic ointment Reorder     Start the following medications: Meds ordered this encounter  Medications   artificial tears (LACRILUBE) OINT ophthalmic ointment    Sig: Place into both eyes at bedtime as needed for dry eyes.    Dispense:  3.5 g    Refill:  0     Follow-up: 3 month follow up   Please make sure to arrive 15 minutes prior to your next appointment. If you arrive late, you may be asked to reschedule.   We look forward to seeing you next time. Please call our clinic at [phone] if you have any questions or concerns. The best time to call is Monday-Friday from 9am-4pm, but there is someone available 24/7. If after hours or the weekend, call the main hospital number and ask for the Internal Medicine Resident On-Call. If you need medication refills, please notify your pharmacy one week in advance and they will send us  a request.  Thank you for letting us  take part in your care. Wishing you the best!  Thank you, Hannah Masker, MD

## 2023-11-25 NOTE — Assessment & Plan Note (Signed)
 Discussed with patient increasing her food intake and using ensure shakes. She is Mirtazapine  7.5 mg daily for insomnia and appetite stimulation.

## 2023-11-25 NOTE — Assessment & Plan Note (Signed)
 May benefit from repeat echo at some point as the last one showing decreased EF was during acute illness. Suspect it has normalized.

## 2023-11-25 NOTE — Assessment & Plan Note (Signed)
 Last hgb noted to be 10.6. Will get iron studies this visit.

## 2023-11-25 NOTE — Assessment & Plan Note (Signed)
 Repeating metabolic panel so will see how her K is doing.

## 2023-11-25 NOTE — Assessment & Plan Note (Signed)
 Pt with GERD and hx of gastric ulcers on Protonix  40 mg daily. Advised to continue taking protonix  40 mg daily.

## 2023-11-25 NOTE — Progress Notes (Signed)
 CC: follow up appiontment  HPI:  [name] is a 73 y.o. with medical history of PBC, cirrhosis c/b esophageal varices, GERD, HFrEF, and insomnia presenting to [facility] for a follow up appointment.   Please see problem-based list for further details, assessments, and plans.  Past Medical History:  Diagnosis Date   Anemia of chronic disease 05/02/2022   Bell's palsy    Chronic idiopathic constipation 02/11/2023   Chronic systolic CHF (congestive heart failure) (HCC) 07/07/2022   Cirrhosis (HCC)    Coronary artery disease    H/O ETOH abuse 08/11/2022   H/O: upper GI bleed 07/05/2022   Heart failure with reduced ejection fraction (HCC)    Iron deficiency anemia due to chronic blood loss 07/07/2022   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(HCC)    Protein-calorie malnutrition, severe 05/21/2021     Current Outpatient Medications (Cardiovascular):    carvedilol  (COREG ) 3.125 MG tablet, Take 1 tablet (3.125 mg total) by mouth 2 (two) times daily.   furosemide  (LASIX ) 20 MG tablet, Take 1 tablet (20 mg total) by mouth daily.   spironolactone  (ALDACTONE ) 50 MG tablet, Take 1 tablet (50 mg total) by mouth daily.     Current Outpatient Medications (Other):    Lactulose 20 GM/30ML SOLN, Take 15 mLs (10 g total) by mouth 2 (two) times daily as needed. Take for a goal of 2 bowel movements per day.   artificial tears (LACRILUBE) OINT ophthalmic ointment, Place into both eyes at bedtime as needed for dry eyes.   lidocaine  (LIDODERM ) 5 %, Place 1 patch onto the skin daily. Remove & Discard patch within 12 hours or as directed by MD   mirtazapine  (REMERON ) 7.5 MG tablet, Take 1 tablet (7.5 mg total) by mouth at bedtime.   pantoprazole  (PROTONIX ) 40 MG tablet, Take 1 tablet (40 mg total) by mouth daily.   ursodiol  (ACTIGALL ) 300 MG capsule, Take 2 capsules (600 mg total) by mouth 2 (two) times daily.  Review of Systems:  Review of system negative unless stated in the problem list or HPI.    Physical  Exam:  Vitals:   11/25/23 1026  BP: 114/60  Pulse: 63  Temp: 97.9 F (36.6 C)  TempSrc: Oral  SpO2: 100%  Weight: 94 lb 8 oz (42.9 kg)  Height: 5\' 2"  (1.575 m)   Physical Exam General: cachetic appearing elderly female.  HENT: NCAT Lungs: CTAB, no wheeze, rhonchi or rales.  Cardiovascular: Normal heart sounds, no r/m/g, 2+ pulses in all extremities. No LE edema Abdomen: No TTP, normal bowel sounds MSK: No asymmetry or muscle atrophy.  Skin: no lesions noted on exposed skin Neuro: Alert and oriented x4. CN grossly intact Psych: Normal mood and normal affect   Assessment & Plan:   Heart failure with reduced ejection fraction [facility]) May benefit from repeat echo at some point as the last one showing decreased EF was during acute illness. Suspect it has normalized.   Normocytic anemia Last hgb noted to be 10.6. Will get iron studies this visit.   Decompensated hepatic cirrhosis (HCC) Pt with decompensated cirrhosis who is doing well. Her decompensated states is due to complications of variceal hemorrhage and ascites. She reports adherence to her regimen. She does not have an encephalopathy or asterixis on exam. Will get repeat CMP and have her continue her lasix  and spironolactone  at current dosage. If pt has problems with low blood pressure, would change coreg  to nadolol prior to decreasing her diuretics. Pt was on colace for constipation but  will change it to lactulose as a prophylactic for hepatic encephalopathy.   GERD (gastroesophageal reflux disease) Pt with GERD and hx of gastric ulcers on Protonix  40 mg daily. Advised to continue taking protonix  40 mg daily.   Hyperkalemia Repeating metabolic panel so will see how her K is doing.   Goals of care, counseling/discussion Discussed at length with patient that her prognosis overall is poor given her decompensated cirrhosis 2/2 to PBC. She had significant hospitalization requiring intubation and pressors. Discussed GOC but  grandson insisted that current measures were okay and he had these discussions before. He stated he and his brother are decision makers for the patient. Discussed PNA vaccine but grandson refused stating she was doing well and he didn't want any vaccines. Pt stated she will think about this. Currently pt is able to do all ADLs/iADLs and goal is to minimize exacerbations of her condition.   Protein calorie malnutrition (HCC) Discussed with patient increasing her food intake and using ensure shakes. She is Mirtazapine  7.5 mg daily for insomnia and appetite stimulation.   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(HCC) Pt not able to get ursodiol  due to cost of medication. After research, this medication can be obtained for $40-50/month with good Rx card. Will give this information to the patient and her grandson. Discussed with Aron Lard and no other assistance is available for this medication. Vitamin A  and D levels checked this visit.    See Encounters Tab for problem based charting.  Patient Discussed with Dr. Laurin Popp, MD Hannah Blake. [facility] Internal Medicine Residency, PGY-3

## 2023-11-25 NOTE — Assessment & Plan Note (Addendum)
 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 She had significant hospitalization requiring intubation and pressors. Discussed GOC but grandson insisted that current measures were okay and he had these discussions before. He stated he and his brother are decision makers for the patient. Discussed PNA vaccine but grandson refused stating she was doing well and he didn't want any vaccines. Pt stated she will think about this. Currently pt is able to do all ADLs/iADLs and goal is to minimize exacerbations of her condition.

## 2023-11-25 NOTE — Assessment & Plan Note (Addendum)
" >>  ASSESSMENT AND PLAN FOR DECOMPENSATED HEPATIC CIRRHOSIS (HCC) WRITTEN ON 11/25/2023  4:01 PM BY FERNAND PROST, MD  Pt with decompensated cirrhosis who is doing well. Her decompensated states is due to complications of variceal hemorrhage and ascites. She reports adherence to her regimen. She does not have an encephalopathy or asterixis on exam. Will get repeat CMP and have her continue her lasix  and spironolactone  at current dosage. If pt has problems with low blood pressure, would change coreg  to nadolol prior to decreasing her diuretics. Pt was on colace for constipation but will change it to lactulose  as a prophylactic for hepatic encephalopathy.    >>ASSESSMENT AND PLAN FOR PRIMARY BILIARY CHOLANGITIS (HCC) WRITTEN ON 11/26/2023  1:44 PM BY Darya Bigler, MD  Pt not able to get ursodiol  due to cost of medication. After research, this medication can be obtained for $40-50/month with good Rx card. Will give this information to the patient and her grandson. Discussed with Lavern and no other assistance is available for this medication. Vitamin A  and D levels checked this visit.  "

## 2023-11-26 ENCOUNTER — Other Ambulatory Visit (HOSPITAL_COMMUNITY): Payer: Self-pay

## 2023-11-26 NOTE — Assessment & Plan Note (Addendum)
 Pt not able to get ursodiol  due to cost of medication. After research, this medication can be obtained for $40-50/month with good Rx card. Will give this information to the patient and her grandson. Discussed with Aron Lard and no other assistance is available for this medication. Vitamin A  and D levels checked this visit.

## 2023-11-29 LAB — CMP14 + ANION GAP
ALT: 20 IU/L (ref 0–32)
AST: 70 IU/L — ABNORMAL HIGH (ref 0–40)
Albumin: 3.5 g/dL — ABNORMAL LOW (ref 3.8–4.8)
Alkaline Phosphatase: 585 IU/L — ABNORMAL HIGH (ref 44–121)
Anion Gap: 14 mmol/L (ref 10.0–18.0)
BUN/Creatinine Ratio: 14 (ref 12–28)
BUN: 15 mg/dL (ref 8–27)
Bilirubin Total: 2.6 mg/dL — ABNORMAL HIGH (ref 0.0–1.2)
CO2: 20 mmol/L (ref 20–29)
Calcium: 9 mg/dL (ref 8.7–10.3)
Chloride: 98 mmol/L (ref 96–106)
Creatinine, Ser: 1.09 mg/dL — ABNORMAL HIGH (ref 0.57–1.00)
Globulin, Total: 4.2 g/dL (ref 1.5–4.5)
Glucose: 92 mg/dL (ref 70–99)
Potassium: 5.3 mmol/L — ABNORMAL HIGH (ref 3.5–5.2)
Sodium: 132 mmol/L — ABNORMAL LOW (ref 134–144)
Total Protein: 7.7 g/dL (ref 6.0–8.5)
eGFR: 54 mL/min/{1.73_m2} — ABNORMAL LOW (ref >59)

## 2023-11-29 LAB — IRON,TIBC AND FERRITIN PANEL
Ferritin: 42 ng/mL (ref 15–150)
Iron Saturation: 20 % (ref 15–55)
Iron: 79 ug/dL (ref 27–139)
Total Iron Binding Capacity: 405 ug/dL (ref 250–450)
UIBC: 326 ug/dL (ref 118–369)

## 2023-11-29 LAB — VITAMIN A: Vitamin A: 7.3 ug/dL — ABNORMAL LOW (ref 22.0–69.5)

## 2023-11-29 LAB — VITAMIN D 25 HYDROXY (VIT D DEFICIENCY, FRACTURES): Vit D, 25-Hydroxy: 7.9 ng/mL — ABNORMAL LOW (ref 30.0–100.0)

## 2023-11-30 NOTE — Progress Notes (Signed)
 Internal Medicine Clinic Attending  Case discussed with the resident at the time of the visit.  We reviewed the resident's history and exam and pertinent patient test results.  I agree with the assessment, diagnosis, and plan of care documented in the resident's note.

## 2023-12-02 ENCOUNTER — Ambulatory Visit: Payer: Self-pay | Admitting: Internal Medicine

## 2023-12-07 ENCOUNTER — Other Ambulatory Visit (HOSPITAL_COMMUNITY): Payer: Self-pay

## 2024-02-16 ENCOUNTER — Other Ambulatory Visit (HOSPITAL_COMMUNITY): Payer: Self-pay

## 2024-02-16 ENCOUNTER — Other Ambulatory Visit: Payer: Self-pay | Admitting: Student

## 2024-02-16 DIAGNOSIS — K7460 Unspecified cirrhosis of liver: Secondary | ICD-10-CM

## 2024-02-16 MED ORDER — PANTOPRAZOLE SODIUM 40 MG PO TBEC
40.0000 mg | DELAYED_RELEASE_TABLET | Freq: Every day | ORAL | 0 refills | Status: DC
  Filled 2024-02-16: qty 90, 90d supply, fill #0

## 2024-02-16 NOTE — Telephone Encounter (Signed)
 Medication sent to pharmacy

## 2024-02-19 ENCOUNTER — Other Ambulatory Visit: Payer: Self-pay

## 2024-02-19 ENCOUNTER — Other Ambulatory Visit (HOSPITAL_COMMUNITY): Payer: Self-pay

## 2024-02-19 ENCOUNTER — Ambulatory Visit: Payer: Self-pay | Admitting: Student

## 2024-02-19 VITALS — BP 117/74 | HR 67 | Temp 97.9°F | Ht 62.0 in | Wt 94.8 lb

## 2024-02-19 DIAGNOSIS — K7290 Hepatic failure, unspecified without coma: Secondary | ICD-10-CM

## 2024-02-19 DIAGNOSIS — R188 Other ascites: Secondary | ICD-10-CM

## 2024-02-19 DIAGNOSIS — E559 Vitamin D deficiency, unspecified: Secondary | ICD-10-CM

## 2024-02-19 DIAGNOSIS — D649 Anemia, unspecified: Secondary | ICD-10-CM

## 2024-02-19 DIAGNOSIS — M545 Low back pain, unspecified: Secondary | ICD-10-CM

## 2024-02-19 DIAGNOSIS — K7460 Unspecified cirrhosis of liver: Secondary | ICD-10-CM

## 2024-02-19 DIAGNOSIS — G8929 Other chronic pain: Secondary | ICD-10-CM

## 2024-02-19 DIAGNOSIS — Z0000 Encounter for general adult medical examination without abnormal findings: Secondary | ICD-10-CM

## 2024-02-19 DIAGNOSIS — Z13820 Encounter for screening for osteoporosis: Secondary | ICD-10-CM

## 2024-02-19 DIAGNOSIS — I5020 Unspecified systolic (congestive) heart failure: Secondary | ICD-10-CM

## 2024-02-19 DIAGNOSIS — K219 Gastro-esophageal reflux disease without esophagitis: Secondary | ICD-10-CM

## 2024-02-19 DIAGNOSIS — F5101 Primary insomnia: Secondary | ICD-10-CM

## 2024-02-19 DIAGNOSIS — E43 Unspecified severe protein-calorie malnutrition: Secondary | ICD-10-CM

## 2024-02-19 MED ORDER — PANTOPRAZOLE SODIUM 40 MG PO TBEC
40.0000 mg | DELAYED_RELEASE_TABLET | Freq: Every day | ORAL | 3 refills | Status: DC
  Filled 2024-02-19: qty 90, 90d supply, fill #0

## 2024-02-19 MED ORDER — VITAMIN D3 125 MCG (5000 UT) PO CAPS
5000.0000 [IU] | ORAL_CAPSULE | Freq: Every day | ORAL | 0 refills | Status: DC
  Filled 2024-02-19: qty 100, 100d supply, fill #0

## 2024-02-19 MED ORDER — MIRTAZAPINE 7.5 MG PO TABS
7.5000 mg | ORAL_TABLET | Freq: Every day | ORAL | 0 refills | Status: AC
  Filled 2024-02-19: qty 30, 30d supply, fill #0

## 2024-02-19 MED ORDER — LIDOCAINE 5 % EX PTCH
1.0000 | MEDICATED_PATCH | CUTANEOUS | 3 refills | Status: AC
  Filled 2024-02-19: qty 5, 5d supply, fill #0

## 2024-02-19 MED ORDER — DICLOFENAC SODIUM 1 % EX GEL
4.0000 g | Freq: Four times a day (QID) | CUTANEOUS | 3 refills | Status: AC
  Filled 2024-02-19: qty 100, 7d supply, fill #0

## 2024-02-19 NOTE — Progress Notes (Signed)
 CC: Follow-up  HPI:  [name] is a 73 y.o. female living with a history stated below and presents today for follow-up. Please see problem based assessment and plan for additional details.  Past Medical History:  Diagnosis Date   Anemia of chronic disease 05/02/2022   Bell's palsy    Chronic idiopathic constipation 02/11/2023   Chronic systolic CHF (congestive heart failure) (HCC) 07/07/2022   Cirrhosis (HCC)    Coronary artery disease    H/O ETOH abuse 08/11/2022   H/O: upper GI bleed 07/05/2022   Heart failure with reduced ejection fraction (HCC)    Iron deficiency anemia due to chronic blood loss 07/07/2022   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(HCC)    Protein-calorie malnutrition, severe 05/21/2021    Current Outpatient Medications on File Prior to Visit  Medication Sig Dispense Refill   artificial tears (LACRILUBE) OINT ophthalmic ointment Place into both eyes at bedtime as needed for dry eyes. 3.5 g 0   carvedilol  (COREG ) 3.125 MG tablet Take 1 tablet (3.125 mg total) by mouth 2 (two) times daily. 90 tablet 0   furosemide  (LASIX ) 20 MG tablet Take 1 tablet (20 mg total) by mouth daily. 90 tablet 0   Lactulose  20 GM/30ML SOLN Take 15 mLs (10 g total) by mouth 2 (two) times daily as needed. Take for a goal of 2 bowel movements per day. 450 mL 11   spironolactone  (ALDACTONE ) 50 MG tablet Take 1 tablet (50 mg total) by mouth daily. 90 tablet 2   ursodiol  (ACTIGALL ) 300 MG capsule Take 2 capsules (600 mg total) by mouth 2 (two) times daily. 120 capsule 0   No current facility-administered medications on file prior to visit.    Family History  Family history unknown: Yes    Social History   Socioeconomic History   Marital status: Single    Spouse name: Not on file   Number of children: Not on file   Years of education: Not on file   Highest education level: Not on file  Occupational History   Not on file  Tobacco Use   Smoking status: Every Day    Current packs/day:  0.25    Average packs/day: 0.3 packs/day for 40.0 years (10.0 ttl pk-yrs)    Types: Cigarettes   Smokeless tobacco: Never   Tobacco comments:    3 cigs per day  Vaping Use   Vaping status: Never Used  Substance and Sexual Activity   Alcohol use: Not Currently    Comment: weekly   Drug use: Never   Sexual activity: Not on file  Other Topics Concern   Not on file  Social History Narrative   Not on file   Social Drivers of Health   Financial Resource Strain: Medium Risk (02/19/2024)   Overall Financial Resource Strain (CARDIA)    Difficulty of Paying Living Expenses: Somewhat hard  Food Insecurity: No Food Insecurity (02/19/2024)   Hunger Vital Sign    Worried About Running Out of Food in the Last Year: Never true    Ran Out of Food in the Last Year: Never true  Transportation Needs: No Transportation Needs (02/19/2024)   PRAPARE - Administrator, Civil Service (Medical): No    Lack of Transportation (Non-Medical): No  Physical Activity: Inactive (02/19/2024)   Exercise Vital Sign    Days of Exercise per Week: 0 days    Minutes of Exercise per Session: 0 min  Stress: No Stress Concern Present (02/19/2024)   [place]  [employer] - Occupational Stress Questionnaire    Feeling of Stress: Not at all  Social Connections: Socially Isolated (02/19/2024)   Social Connection and Isolation Panel    Frequency of Communication with Friends and Family: More than three times a week    Frequency of Social Gatherings with Friends and Family: More than three times a week    Attends Religious Services: Never    Database administrator or Organizations: No    Attends Banker Meetings: Never    Marital Status: Never married  Intimate Partner Violence: Not At Risk (02/19/2024)   Humiliation, Afraid, Rape, and Kick questionnaire    Fear of Current or Ex-Partner: No    Emotionally Abused: No    Physically Abused: No    Sexually Abused: No    Review of  Systems: ROS negative except for what is noted on the assessment and plan.  Vitals:   02/19/24 1005  BP: 117/74  Pulse: 67  Temp: 97.9 F (36.6 C)  TempSrc: Oral  SpO2: 97%  Weight: 94 lb 12.8 oz (43 kg)  Height: 5' 2 (1.575 m)    Physical Exam: Constitutional: frail appearing, sitting in chair, in no acute distress Cardiovascular: regular rate and rhythm, no m/r/g Pulmonary/Chest: normal work of breathing on room air, lungs clear to auscultation bilaterally Abdominal: soft, non-tender, non-distended MSK: no spinous process tenderness, straight leg negative Skin: mild jaundice Neuro: A&O X 4 Psych: normal mood and behavior  Assessment & Plan:     Patient discussed with Dr. Shawn   Decompensated hepatic cirrhosis (HCC) Currently stable. Decompensated given history of variceal hemorrhage and ascites. She does not have encephalopathy or asterixis on exam. Denies signs of bleeding/hemoptysis. Volume status is reassuring as well. Will get repeat CMP. Given lightheadedness without being on Coreg , Aldactone , and Lasix , will hold off on these until follow-up next week.   GERD (gastroesophageal reflux disease) Refilled Protonix .  Protein calorie malnutrition (HCC) Reduced appetite. Prescribed Mirtazapine  for this but had ran out. Refilled today. Current BMI is 17.3 and weight is overall stable since last office visit 11/2023.  Heart failure with reduced ejection fraction (HCC) Noted on TTE in 2022 with EF 45-50%. Does not follow with Cardiology given insurance issues. Not on full GDMT though related medications include Coreg  3.125 BID (prescribed given history of esophageal varices), Aldactone  50 mg daily, and Lasix  20 mg daily which are both prescribed primarily for her cirrhosis. Today she endorses lightheadedness primarily with positional changes. Orthostatics were negative in office today. But I am hesitant to refill Coreg  for this reason as BP was normal and she has been without  Coreg , Aldactone , and Lasix . I have ordered a repeat TTE to clarify updated cardiac function. I also encouraged increased po intake as I suspect this to be contributory as well. I do not want her too long without above medications so she is coming back for re-evaluation next week. Will check CBC today as well as she has a history of anemia.   Normocytic anemia Check CBC. Recent iron studies looked good.   Low back pain This has been a lingering issue for her. 5/10 lumbar region. Previously tried lidocaine  patch which helped some. Denies red flag symptoms and exam today not consistent with radicular pattern. Will manage conservative for now with Lidocaine  patch, Voltaren  gel.   Vitamin D  deficiency 7.9 11/2023. Today I have sent in Vitamin D  5,000 international units/daily for 12 weeks. Will plan to recheck level then.  Healthcare maintenance Patient declined DEXA and Mammogram today.   Norman Lobstein, D.O. [facility] Internal Medicine, PGY-2 Phone: [phone] Date 02/22/2024 Time 8:35 AM

## 2024-02-20 LAB — CBC
Hematocrit: 28.4 % — ABNORMAL LOW (ref 34.0–46.6)
Hemoglobin: 9.1 g/dL — ABNORMAL LOW (ref 11.1–15.9)
MCH: 29.5 pg (ref 26.6–33.0)
MCHC: 32 g/dL (ref 31.5–35.7)
MCV: 92 fL (ref 79–97)
Platelets: 155 x10E3/uL (ref 150–450)
RBC: 3.08 x10E6/uL — ABNORMAL LOW (ref 3.77–5.28)
RDW: 14.7 % (ref 11.7–15.4)
WBC: 7.1 x10E3/uL (ref 3.4–10.8)

## 2024-02-20 LAB — COMPREHENSIVE METABOLIC PANEL WITH GFR
ALT: 25 IU/L (ref 0–32)
AST: 77 IU/L — ABNORMAL HIGH (ref 0–40)
Albumin: 3.3 g/dL — ABNORMAL LOW (ref 3.8–4.8)
Alkaline Phosphatase: 591 IU/L — ABNORMAL HIGH (ref 44–121)
BUN/Creatinine Ratio: 15 (ref 12–28)
BUN: 12 mg/dL (ref 8–27)
Bilirubin Total: 2.7 mg/dL — ABNORMAL HIGH (ref 0.0–1.2)
CO2: 20 mmol/L (ref 20–29)
Calcium: 8.3 mg/dL — ABNORMAL LOW (ref 8.7–10.3)
Chloride: 106 mmol/L (ref 96–106)
Creatinine, Ser: 0.81 mg/dL (ref 0.57–1.00)
Globulin, Total: 3.9 g/dL (ref 1.5–4.5)
Glucose: 85 mg/dL (ref 70–99)
Potassium: 4.4 mmol/L (ref 3.5–5.2)
Sodium: 136 mmol/L (ref 134–144)
Total Protein: 7.2 g/dL (ref 6.0–8.5)
eGFR: 76 mL/min/1.73 (ref >59)

## 2024-02-22 ENCOUNTER — Other Ambulatory Visit (HOSPITAL_COMMUNITY): Payer: Self-pay

## 2024-02-22 ENCOUNTER — Ambulatory Visit: Payer: Self-pay | Admitting: Student

## 2024-02-22 DIAGNOSIS — E559 Vitamin D deficiency, unspecified: Secondary | ICD-10-CM | POA: Insufficient documentation

## 2024-02-22 DIAGNOSIS — M545 Low back pain, unspecified: Secondary | ICD-10-CM | POA: Insufficient documentation

## 2024-02-22 NOTE — Assessment & Plan Note (Addendum)
 Noted on TTE in 2022 with EF 45-50%. Does not follow with Cardiology given insurance issues. Not on full GDMT though related medications include Coreg  3.125 BID (prescribed given history of esophageal varices), Aldactone  50 mg daily, and Lasix  20 mg daily which are both prescribed primarily for her cirrhosis. Today she endorses lightheadedness primarily with positional changes. Orthostatics were negative in office today. But I am hesitant to refill Coreg  for this reason as BP was normal and she has been without Coreg , Aldactone , and Lasix . I have ordered a repeat TTE to clarify updated cardiac function. I also encouraged increased po intake as I suspect this to be contributory as well. I do not want her too long without above medications so she is coming back for re-evaluation next week. Will check CBC today as well as she has a history of anemia.

## 2024-02-22 NOTE — Addendum Note (Signed)
 Addended by: Telesa Jeancharles on: 02/22/2024 04:50 PM   Modules accepted: Level of Service

## 2024-02-22 NOTE — Assessment & Plan Note (Signed)
 7.9 11/2023. Today I have sent in Vitamin D  5,000 international units/daily for 12 weeks. Will plan to recheck level then.

## 2024-02-22 NOTE — Progress Notes (Signed)
 Patient ID: Hannah Blake, female   DOB: September 28, 1950, 73 y.o.   MRN: [id number]  Internal Medicine Clinic Attending  Case discussed with the resident at the time of the visit.  We reviewed the resident's history and exam and pertinent patient test results.  I agree with the assessment, diagnosis, and plan of care documented in the resident's note.

## 2024-02-22 NOTE — Assessment & Plan Note (Addendum)
 Currently stable. Decompensated given history of variceal hemorrhage and ascites. She does not have encephalopathy or asterixis on exam. Denies signs of bleeding/hemoptysis. Volume status is reassuring as well. Will get repeat CMP. Given lightheadedness without being on Coreg , Aldactone , and Lasix , will hold off on these until follow-up next week.

## 2024-02-22 NOTE — Assessment & Plan Note (Signed)
 Refilled Protonix

## 2024-02-22 NOTE — Assessment & Plan Note (Addendum)
 Reduced appetite. Prescribed Mirtazapine  for this but had ran out. Refilled today. Current BMI is 17.3 and weight is overall stable since last office visit 11/2023.

## 2024-02-22 NOTE — Assessment & Plan Note (Signed)
 This has been a lingering issue for her. 5/10 lumbar region. Previously tried lidocaine  patch which helped some. Denies red flag symptoms and exam today not consistent with radicular pattern. Will manage conservative for now with Lidocaine  patch, Voltaren  gel.

## 2024-02-22 NOTE — Assessment & Plan Note (Signed)
 Patient declined DEXA and Mammogram today.

## 2024-02-22 NOTE — Assessment & Plan Note (Addendum)
 Check CBC. Recent iron studies looked good.

## 2024-02-23 NOTE — Addendum Note (Signed)
 Addended by: SHAWN SICK on: 02/23/2024 08:27 AM   Modules accepted: Level of Service

## 2024-02-29 ENCOUNTER — Encounter: Admitting: Student

## 2024-02-29 ENCOUNTER — Telehealth: Payer: Self-pay | Admitting: Student

## 2024-02-29 ENCOUNTER — Encounter: Payer: Self-pay | Admitting: Student

## 2024-02-29 NOTE — Telephone Encounter (Signed)
 Attempted to contact patient via telephone to reschedule her appointment that was no showed today.  No answer and unable to leave message due to phone not having a voicemail.  No show letter will be mailed to current address on file.

## 2024-03-28 ENCOUNTER — Ambulatory Visit (HOSPITAL_COMMUNITY): Admission: RE | Admit: 2024-03-28 | Payer: Self-pay | Source: Ambulatory Visit

## 2024-04-07 ENCOUNTER — Telehealth: Payer: Self-pay

## 2024-04-07 NOTE — Telephone Encounter (Signed)
 Patient last seen 02/19/24 I called the patient to schedule a fu appointment. Unable to reach the patient, unable to lvm.

## 2024-05-09 ENCOUNTER — Ambulatory Visit (HOSPITAL_COMMUNITY)
Admission: RE | Admit: 2024-05-09 | Discharge: 2024-05-09 | Disposition: A | Discharge status: Home / Self Care | Payer: Self-pay | Source: Ambulatory Visit | Attending: Surgery | Admitting: Surgery

## 2024-05-09 DIAGNOSIS — R600 Localized edema: Secondary | ICD-10-CM

## 2024-05-09 DIAGNOSIS — I5020 Unspecified systolic (congestive) heart failure: Secondary | ICD-10-CM | POA: Insufficient documentation

## 2024-05-09 DIAGNOSIS — R0602 Shortness of breath: Secondary | ICD-10-CM

## 2024-05-09 LAB — ECHOCARDIOGRAM COMPLETE
AR max vel: 2.31 cm2
AV Area VTI: 2.38 cm2
AV Area mean vel: 2.5 cm2
AV Mean grad: 3 mmHg
AV Peak grad: 5.6 mmHg
Ao pk vel: 1.18 m/s
Area-P 1/2: 3 cm2
S' Lateral: 2.33 cm

## 2024-05-18 ENCOUNTER — Telehealth: Payer: Self-pay | Admitting: *Deleted

## 2024-05-18 NOTE — Telephone Encounter (Signed)
 I asked pt's grandson if pt is taking Lactulose . He stated pt has never taken any kind of liquid medication only pills. Please advise if pt needs to start Lactulose  or you will prescribe something else. Thanks

## 2024-05-18 NOTE — Telephone Encounter (Signed)
 Copied from CRM [id number]. Topic: Clinical - Medication Question >> May 18, 2024 11:42 AM Hannah Blake wrote: Reason for CRM: patient grandson calling to ask if the patient could have a prescription written  for constipation he states her stomach is starting to harden up the patient is using it but not a lot and is straining when trying to go   Hannah Blake rom [phone] (M)  [facility] [address], [address] [place] [address] Phone: [phone] Fax: [phone]

## 2024-05-19 ENCOUNTER — Other Ambulatory Visit (HOSPITAL_COMMUNITY): Payer: Self-pay

## 2024-05-19 NOTE — Telephone Encounter (Signed)
 I called [facility] pharmacy - stated no one picked up Lactulose ; it sat on their shelf for 10 days before they put it back in their stock. Stated it will be $45.00 w/o insurance. I called pt's son, [name] - informed if pt has insurance to bring the card or it will be $45.00 when he picks up the medication.

## 2024-05-20 ENCOUNTER — Ambulatory Visit: Payer: Self-pay | Admitting: Student

## 2024-05-20 ENCOUNTER — Other Ambulatory Visit (HOSPITAL_COMMUNITY): Payer: Self-pay

## 2024-05-20 ENCOUNTER — Other Ambulatory Visit: Payer: Self-pay

## 2024-05-20 VITALS — BP 132/82 | HR 87 | Temp 98.1°F | Ht 62.0 in | Wt 105.6 lb

## 2024-05-20 DIAGNOSIS — R188 Other ascites: Secondary | ICD-10-CM

## 2024-05-20 DIAGNOSIS — K7290 Hepatic failure, unspecified without coma: Secondary | ICD-10-CM

## 2024-05-20 DIAGNOSIS — F17210 Nicotine dependence, cigarettes, uncomplicated: Secondary | ICD-10-CM

## 2024-05-20 DIAGNOSIS — K7460 Unspecified cirrhosis of liver: Secondary | ICD-10-CM

## 2024-05-20 DIAGNOSIS — Z79899 Other long term (current) drug therapy: Secondary | ICD-10-CM

## 2024-05-20 MED ORDER — PANTOPRAZOLE SODIUM 40 MG PO TBEC
40.0000 mg | DELAYED_RELEASE_TABLET | Freq: Every day | ORAL | 3 refills | Status: DC
  Filled 2024-05-20: qty 90, 90d supply, fill #0

## 2024-05-20 MED ORDER — FUROSEMIDE 20 MG PO TABS
20.0000 mg | ORAL_TABLET | Freq: Every day | ORAL | 0 refills | Status: DC
  Filled 2024-05-20: qty 30, 30d supply, fill #0

## 2024-05-20 MED ORDER — SPIRONOLACTONE 50 MG PO TABS
50.0000 mg | ORAL_TABLET | Freq: Every day | ORAL | 2 refills | Status: DC
  Filled 2024-05-20: qty 30, 30d supply, fill #0

## 2024-05-20 MED ORDER — URSODIOL 300 MG PO CAPS
600.0000 mg | ORAL_CAPSULE | Freq: Two times a day (BID) | ORAL | 0 refills | Status: AC
Start: 2024-05-20 — End: ?
  Filled 2024-05-20: qty 120, 30d supply, fill #0

## 2024-05-20 MED ORDER — CARVEDILOL 3.125 MG PO TABS
3.1250 mg | ORAL_TABLET | Freq: Two times a day (BID) | ORAL | 0 refills | Status: AC
  Filled 2024-05-20: qty 60, 30d supply, fill #0

## 2024-05-20 MED ORDER — VITAMIN D3 125 MCG (5000 UT) PO CAPS
5000.0000 [IU] | ORAL_CAPSULE | Freq: Every day | ORAL | 0 refills | Status: AC
  Filled 2024-05-20: qty 100, 100d supply, fill #0

## 2024-05-20 MED ORDER — LACTULOSE 20 GM/30ML PO SOLN: 15.0000 mL | Freq: Two times a day (BID) | ORAL | 11 refills | Status: DC | PRN

## 2024-05-20 NOTE — Patient Instructions (Addendum)
 Please restart your medicines.  Lasix , Spironolactone  Lactulose   These are essential for you! If you cannot refill or run out, always call our clinic and we will do our best to help you.

## 2024-05-20 NOTE — Progress Notes (Signed)
   CC: constipation  HPI:  [name] is a 73 y.o. female with a PMH stated below who presents today for acute evaluation.  Please see problem based assessment and plan for additional details.  Past Medical History:  Diagnosis Date   Anemia of chronic disease 05/02/2022   Bell's palsy    Chronic idiopathic constipation 02/11/2023   Chronic systolic CHF (congestive heart failure) (HCC) 07/07/2022   Cirrhosis (HCC)    Coronary artery disease    H/O ETOH abuse 08/11/2022   H/O: upper GI bleed 07/05/2022   Heart failure with reduced ejection fraction (HCC)    Iron deficiency anemia due to chronic blood loss 07/07/2022   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(HCC)    Protein-calorie malnutrition, severe 05/21/2021    Review of Systems: ROS negative except for what is noted on the assessment and plan.  Vitals:   05/20/24 0922  BP: 132/82  Pulse: 87  Temp: 98.1 F (36.7 C)  TempSrc: Oral  SpO2: 96%  Weight: 105 lb 9.6 oz (47.9 kg)  Height: 5' 2 (1.575 m)    Physical Exam: Constitutional: frail-appearing woman in no acute distress HENT: normocephalic atraumatic, mucous membranes moist Eyes: scleral icterus Cardiovascular: regular rate and rhythm, no m/r/g Pulmonary/Chest: normal work of breathing on room air, lungs clear to auscultation bilaterally Abdominal: soft, non-tender, moderate ascites below umbilicus, bowel sounds present MSK: frail Neurological: alert & oriented x 3, no focal deficit Skin: warm and dry. Jaundice in face. Psych: normal mood and behavior  Assessment & Plan:   Patient discussed with Dr. CHARLENA Blake  Decompensated hepatic cirrhosis (HCC) History variceal bleeding, ascites, encephalopathy, required paracentesis in the past, but no recent procedures or hospitalizations. Also history PBC.  Today presents with chief complaint constipation and oliguria.  Small bowel movements every 3 to 4 days.  Very small voiding once or twice a day.  On exam, bowel  sounds are present, moderate volume ascites, lungs clear, moderate jaundice noticeable in the skin of the face and eyes.  As it turns out, off her medicines for about one month.  Hannah Blake is present, states that current jaundice is normal for her.  Her ascites however is progressing over the several weeks.  Her mentation is intact.  Her vitals are stable.  Will refill all of her medicines. Goal to reduce her ascites and return to bowel and bladder goals. Specifically discussed the importance of spironolactone , Lasix , lactulose .   - Lactulose  15 BID prn goal 3 bm/day - Spironolactone  50 every day - Lasix  20 every day Asked her to return to the clinic in 1 week for evaluation of ascites and return of bowel habits.  Increase in diuretics may be necessary to address ascites (both may be doubled).  Precautions for emergency eval, notably altered mental state, discussed with patient and her family.  Hannah Blake, D.O. [facility], PGY-2 Phone: [phone] Date 05/20/2024 Time 10:10 AM

## 2024-05-20 NOTE — Progress Notes (Signed)
 Internal Medicine Clinic Attending  Case discussed with the resident at the time of the visit.  We reviewed the resident's history and exam and pertinent patient test results.  I agree with the assessment, diagnosis, and plan of care documented in the resident's note.

## 2024-05-20 NOTE — Assessment & Plan Note (Addendum)
 History variceal bleeding, ascites, encephalopathy, required paracentesis in the past, but no recent procedures or hospitalizations. Also history PBC.  Today presents with chief complaint constipation and oliguria.  Small bowel movements every 3 to 4 days.  Very small voiding once or twice a day.  On exam, bowel sounds are present, moderate volume ascites, lungs clear, moderate jaundice noticeable in the skin of the face and eyes.  As it turns out, off her medicines for about one month.  Hannah Blake is present, states that current jaundice is normal for her.  Her ascites however is progressing over the several weeks.  Her mentation is intact.  Her vitals are stable.  Will refill all of her medicines. Goal to reduce her ascites and return to bowel and bladder goals. Specifically discussed the importance of spironolactone , Lasix , lactulose .   - Lactulose  15 BID prn goal 3 bm/day - Spironolactone  50 every day - Lasix  20 every day Asked her to return to the clinic in 1 week for evaluation of ascites and return of bowel habits.  Increase in diuretics may be necessary to address ascites (both may be doubled).  Precautions for emergency eval, notably altered mental state, discussed with patient and her family.

## 2024-05-31 ENCOUNTER — Other Ambulatory Visit (HOSPITAL_COMMUNITY): Payer: Self-pay

## 2024-07-29 ENCOUNTER — Telehealth: Payer: Self-pay | Admitting: *Deleted

## 2024-07-29 NOTE — Telephone Encounter (Signed)
 Call from patient's son to schedule appointment for paracentesis.  Patient abdomen is starting to swell again. Patient stated feels mother is ok to wait.  Was offered Monday appointment . Deferred until Tuesday.  Advised that if symptoms worsen to go to the ER.

## 2024-08-02 ENCOUNTER — Ambulatory Visit: Payer: Self-pay

## 2024-08-04 ENCOUNTER — Other Ambulatory Visit: Payer: Self-pay

## 2024-08-04 ENCOUNTER — Ambulatory Visit: Payer: Self-pay

## 2024-08-04 ENCOUNTER — Other Ambulatory Visit (HOSPITAL_COMMUNITY): Payer: Self-pay

## 2024-08-04 DIAGNOSIS — K7460 Unspecified cirrhosis of liver: Secondary | ICD-10-CM

## 2024-08-04 DIAGNOSIS — R188 Other ascites: Secondary | ICD-10-CM

## 2024-08-04 MED ORDER — FUROSEMIDE 20 MG PO TABS
20.0000 mg | ORAL_TABLET | Freq: Every day | ORAL | 2 refills | Status: AC
  Filled 2024-08-04: qty 30, 30d supply, fill #0
  Filled 2024-08-04: qty 90, 90d supply, fill #0

## 2024-08-04 MED ORDER — PANTOPRAZOLE SODIUM 40 MG PO TBEC
40.0000 mg | DELAYED_RELEASE_TABLET | Freq: Every day | ORAL | 3 refills | Status: AC
  Filled 2024-08-04: qty 30, 30d supply, fill #0

## 2024-08-04 MED ORDER — SPIRONOLACTONE 50 MG PO TABS
50.0000 mg | ORAL_TABLET | Freq: Every day | ORAL | 2 refills | Status: DC
  Filled 2024-08-04: qty 30, 30d supply, fill #0

## 2024-08-04 MED ORDER — LACTULOSE 20 GM/30ML PO SOLN
15.0000 mL | Freq: Two times a day (BID) | ORAL | 11 refills | Status: AC | PRN
  Filled 2024-08-04: qty 946, 32d supply, fill #0

## 2024-08-04 NOTE — Progress Notes (Signed)
 "  Established Patient Office Visit  Subjective   Patient ID: EMELINA HINCH, female    DOB: [birth year]  Age: 74 y.o. MRN: [id number]  HPI Here for decompensated cirrhosis follow-up.  Accompanied by her grandson.  Past Medical History:  Diagnosis Date   Anemia of chronic disease 05/02/2022   Bell's palsy    Chronic idiopathic constipation 02/11/2023   Chronic systolic CHF (congestive heart failure) (HCC) 07/07/2022   Cirrhosis (HCC)    Coronary artery disease    H/O ETOH abuse 08/11/2022   H/O: upper GI bleed 07/05/2022   Heart failure with reduced ejection fraction (HCC)    Iron deficiency anemia due to chronic blood loss 07/07/2022   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(HCC)    Protein-calorie malnutrition, severe 05/21/2021        Objective:     BP 129/67 (BP Location: Right Arm, Patient Position: Sitting)   Pulse 78   Temp 98.2 F (36.8 C) (Oral)   Ht 5' 2 (1.575 m)   Wt 105 lb 6.4 oz (47.8 kg)   SpO2 100%   BMI 19.28 kg/m  BP Readings from Last 3 Encounters:  08/04/24 129/67  05/20/24 132/82  02/19/24 117/74   Wt Readings from Last 3 Encounters:  08/04/24 105 lb 6.4 oz (47.8 kg)  05/20/24 105 lb 9.6 oz (47.9 kg)  02/19/24 94 lb 12.8 oz (43 kg)      Physical Exam Vitals reviewed.  Constitutional:      Comments: Jaundice, her skin and conjunctiva are yellow, per chart this is chronic  HENT:     Nose: Nose normal.     Mouth/Throat:     Mouth: Mucous membranes are moist.  Cardiovascular:     Rate and Rhythm: Normal rate and regular rhythm.     Pulses: Normal pulses.     Heart sounds: Normal heart sounds.  Pulmonary:     Effort: Pulmonary effort is normal.     Breath sounds: Normal breath sounds. No wheezing or rales.  Abdominal:     General: Bowel sounds are normal. There is distension.     Palpations: There is hepatomegaly. There is no fluid wave or mass.     Tenderness: There is no abdominal tenderness. There is no rebound.  Musculoskeletal:         General: Normal range of motion.     Cervical back: Normal range of motion.     Comments: 1+ bilateral pitting edema, mild  Skin:    Coloration: Skin is jaundiced.  Neurological:     General: No focal deficit present.     Mental Status: She is alert.     Comments: Her grandson says she is at her baseline, alert and oriented to person and place  Psychiatric:        Mood and Affect: Mood normal.      No results found for any visits on 08/04/24.     The ASCVD Risk score (Arnett DK, et al., 2019) failed to calculate for the following reasons:   Cannot find a previous HDL lab   Cannot find a previous total cholesterol lab   * - Cholesterol units were assumed    Assessment & Plan:   Assessment & Plan Cirrhosis of liver with ascites, unspecified hepatic cirrhosis type (HCC) Has a history of variceal bleeding, ascites with paracenteses, encephalopathy but has not had any recent procedures or hospitalization.  She was last seen 04/2024 and her lactulose , spironolactone  and Lasix  were refilled.  Plan at  this time was to return in 1 week for evaluation of ascites in bowel habits.  Patient did not follow-up, this is the first time being seen since then.  Today her biggest complaints are her shortness of breath on exertion and at baseline, she feels like she cannot get a good deep breath because her abdomen is so distended.  She says that she has a great appetite and that she wants to eat but she gets full very quickly.  Her diet largely consist of rice and Post[place] and bread.  She also feels like her legs are swollen.  She has very small bowel movements every few days and does not pee but very small amounts a couple times a day.  She has no pain with defecation or urination just very small frequencies in quantity.  She says that she has been taking her medications but is not able to articulate which medications that she takes, her grandson says that she takes the things that she supposed to but per  dispense history I do not think she has been taking her spironolactone  and Lasix .  We discussed the importance of adhering to her medication regimen and having close follow-up to ensure that her symptoms are improving.  Discussed that paracenteses are a last line option for managing her ascites as they increase the risk for infection.  Also discussed the importance of nutrition and specifically trying to eat things that are of smaller size than bread and rice with more nutrients such as peanut butter, avocado and eggs.  Will go ahead and repeat labs today.  There is no concern on physical exam for urgent paracentesis at this time.  Will refill all of her medications and enforced close follow-up in about 3 to 4 weeks to ensure her shortness of breath, early satiety, constipation and low urinary frequency and swelling are getting better. Plan Spironolactone  50 mg daily, lactulose  twice daily, furosemide  20 mg daily Goal 2-3 bowel movements per day CMP and CBC Follow-up 3 to 4 weeks Orders:   spironolactone  (ALDACTONE ) 50 MG tablet; Take 1 tablet (50 mg total) by mouth daily.   furosemide  (LASIX ) 20 MG tablet; Take 1 tablet (20 mg total) by mouth daily.   pantoprazole  (PROTONIX ) 40 MG tablet; Take 1 tablet (40 mg total) by mouth daily.   Comprehensive metabolic panel with GFR   CBC no Diff   Return in about 3 weeks (around 08/25/2024) for 3-4 weeks for cirrhosis fu.    Viktoria King, DO "

## 2024-08-04 NOTE — Patient Instructions (Addendum)
 It was wonderful seeing you today!   Please remember to...  1) To help you poop:  Take Lactulose  (15 mL) twice a day. The goal is to have 2 bowel movements a day.   2) For your swelling and to help you pee:  Take one furosemide  tablet and one spironolactone  tablet every morning   3) Make sure to drink plenty of water and eat nutritious foods. Because you get full quickly, try to eat calorie dense food that doesn't take up too much space in your stomach and has a lot of nutrients in it  4) We're updating your blood count, electrolyte and liver labs today. I will call you if we need to change anything about your medicines based on your labs  5) Please come to your follow up appointment in 3-4 weeks so we can make sure you are feeling better  If you have any questions please feel free to the call the clinic at anytime at [phone].  Have a blessed day,  Dr. Charmayne   Th?t tuy?t v?i khi ???c g?p b?n hm nay!  Vui lng nh? nh?ng ?i?u sau:  1) ?? gip b?n ?i tiu d? dng h?n: U?ng Lactulose  (15 mL) hai l?n m?t ngy. M?c tiu l ?i tiu hai l?n m?t ngy.  2) ?? gi?m s?ng v gip b?n ?i ti?u d? dng h?n: U?ng m?t vin furosemide  v m?t vin spironolactone  m?i sng.  3) Hy nh? u?ng nhi?u n??c v ?n nh?ng th?c ph?m b? d??ng. V b?n d? b? no nhanh, hy c? g?ng ?n nh?ng th?c ph?m giu calo nh?ng khng chi?m nhi?u di?n tch trong d? dy v ch?a nhi?u ch?t dinh d??ng.  4) Chng ti s? c?p nh?t k?t qu? xt nghi?m mu, ?i?n gi?i v ch?c n?ng gan c?a b?n hm nay. Ti s? g?i cho b?n n?u c?n thay ??i b?t k? lo?i thu?c no d?a trn k?t qu? xt nghi?m.  5) Vui lng ??n khm l?i sau 3-4 tu?n ?? chng ti c th? ??m b?o b?n c?m th?y t?t h?n.  N?u b?n c b?t k? cu h?i no, vui lng g?i cho phng khm b?t c? lc no theo s? [phone]. Chc b?n m?t ngy t?t lnh, Bc s? Renn Stille

## 2024-08-05 ENCOUNTER — Ambulatory Visit: Payer: Self-pay

## 2024-08-05 LAB — COMPREHENSIVE METABOLIC PANEL WITH GFR
ALT: 14 IU/L (ref 0–32)
AST: 57 IU/L — ABNORMAL HIGH (ref 0–40)
Albumin: 2.5 g/dL — ABNORMAL LOW (ref 3.8–4.8)
Alkaline Phosphatase: 232 IU/L — ABNORMAL HIGH (ref 49–135)
BUN/Creatinine Ratio: 14 (ref 12–28)
BUN: 23 mg/dL (ref 8–27)
Bilirubin Total: 7 mg/dL — ABNORMAL HIGH (ref 0.0–1.2)
CO2: 17 mmol/L — ABNORMAL LOW (ref 20–29)
Calcium: 8.6 mg/dL — ABNORMAL LOW (ref 8.7–10.3)
Chloride: 109 mmol/L — ABNORMAL HIGH (ref 96–106)
Creatinine, Ser: 1.64 mg/dL — ABNORMAL HIGH (ref 0.57–1.00)
Globulin, Total: 4.2 g/dL (ref 1.5–4.5)
Glucose: 93 mg/dL (ref 70–99)
Potassium: 3.6 mmol/L (ref 3.5–5.2)
Sodium: 141 mmol/L (ref 134–144)
Total Protein: 6.7 g/dL (ref 6.0–8.5)
eGFR: 33 mL/min/1.73 — ABNORMAL LOW

## 2024-08-05 LAB — CBC
Hematocrit: 21.4 % — ABNORMAL LOW (ref 34.0–46.6)
Hemoglobin: 7.7 g/dL — ABNORMAL LOW (ref 11.1–15.9)
MCH: 30.6 pg (ref 26.6–33.0)
MCHC: 36 g/dL — ABNORMAL HIGH (ref 31.5–35.7)
MCV: 85 fL (ref 79–97)
Platelets: 141 x10E3/uL — ABNORMAL LOW (ref 150–450)
RBC: 2.52 x10E6/uL — CL (ref 3.77–5.28)
RDW: 14.1 % (ref 11.7–15.4)
WBC: 6.2 x10E3/uL (ref 3.4–10.8)

## 2024-08-08 ENCOUNTER — Other Ambulatory Visit: Payer: Self-pay

## 2024-08-08 ENCOUNTER — Ambulatory Visit: Payer: Self-pay

## 2024-08-08 ENCOUNTER — Ambulatory Visit: Payer: Self-pay | Admitting: Student

## 2024-08-08 VITALS — BP 107/62 | HR 75 | Temp 97.8°F | Ht 62.0 in | Wt 104.4 lb

## 2024-08-08 DIAGNOSIS — K7460 Unspecified cirrhosis of liver: Secondary | ICD-10-CM

## 2024-08-08 DIAGNOSIS — K7290 Hepatic failure, unspecified without coma: Secondary | ICD-10-CM

## 2024-08-08 MED ORDER — SPIRONOLACTONE 50 MG PO TABS
50.0000 mg | ORAL_TABLET | Freq: Every day | ORAL | 2 refills | Status: AC
  Filled 2024-08-08: qty 90, 90d supply, fill #0

## 2024-08-08 NOTE — Assessment & Plan Note (Addendum)
 Patient with a history of decompensated liver cirrhosis secondary to primary biliary cholangitis complicated by ascites and prior variceal bleeding presents for follow up visit after she was seen 4 days ago.She was advised to return to follow up on her shortness of breath, constipation, LE edema and early satiety.  Of note, this pleasant patient  was diagnosed with cirrhosis in 12/2021 after presenting with hematemesis and was found to have two large esophageal varices, which were banded at that time. She was started on ursodiol  for PBC but has been unable to continue therapy due to cost (> $100/month). She has not yet established care with Gastroenterology due to insurance barriers.  At her visit last week, labs were notable for: AKI: Cr 1.64 (up from 0.81 five months prior), eGFR 33 (from 76) AST 57, ALT normal. Alkaline phosphatase 232, improved from 591 five months ago. She also reported constipation and abdominal discomfort at that time. Since starting lactulose , constipation has improved. She reports some improvement in abdominal discomfort and early satiety. Shortness of breath and swelling are improved and being monitored.  I suspect her symptoms were likely due to medication nonadherence, as she had been without her prescribed medications for over one month prior to her presentation last week.  Significant barriers to care include medication cost and insurance limitations. Her grandson expressed frustration with medication affordability. She is able to obtain spironolactone  through the [facility] program; however, lactulose , furosemide  , and ursodiol  are not covered.  Plan: - GI referral placed (priority) - CBC - CMP - Continue Lactulose , spironolactone  and furosemide  - No urgent indication for paracentesis or SBP prophylaxis  - Reach out to pharmacy coordinator to explore medication assistance programs for Ursodiol  ,Lactulose  and Furosemide  - Return precautions given (Worsening shortness of  breath, abdominal distension, confusion, GI bleeding, or decreased urine output

## 2024-08-08 NOTE — Telephone Encounter (Signed)
Patient is scheduled to come in this afternoon.

## 2024-08-08 NOTE — Patient Instructions (Addendum)
 Thank you, Ms.Chlora T Schwimmer for allowing us  to provide your care today. Today we discussed your liver disease.  I am glad your shortness of breath, lower legs  swelling and your constipation is getting better.   Please pick your spironolactone  from our pharmacy in the meantime while we work on getting some assistance with your lactulose , ursodiol  and lasix  although this is not guaranteed.   Return or call us  for worsening shortness of breath, abdominal distension, confusion, GI bleeding, or decreased urine output  I have ordered the following labs for you:  Lab Orders         Comprehensive metabolic panel with GFR         CBC with Diff       Tests ordered today:    Referrals ordered today:   Referral Orders         Ambulatory referral to Gastroenterology       I have ordered the following medication/changed the following medications:   Stop the following medications: Medications Discontinued During This Encounter  Medication Reason   spironolactone  (ALDACTONE ) 50 MG tablet Reorder     Start the following medications: Meds ordered this encounter  Medications   spironolactone  (ALDACTONE ) 50 MG tablet    Sig: Take 1 tablet (50 mg total) by mouth daily.    Dispense:  90 tablet    Refill:  2    DOH     Follow up: 2 months   Remember:   Should you have any questions or concerns please call the internal medicine clinic at [phone].   Drue Lisa Grow MD 08/08/2024, 8:43 PM   [facility]

## 2024-08-08 NOTE — Progress Notes (Unsigned)
 "  CC: Labs follow up  HPI:  Hannah Blake is a 74 y.o. female living with a history stated below and presents today for follow up visit. Please see problem based assessment and plan for additional details.  Past Medical History:  Diagnosis Date   Anemia of chronic disease 05/02/2022   Bell's palsy    Chronic idiopathic constipation 02/11/2023   Chronic systolic CHF (congestive heart failure) (HCC) 07/07/2022   Cirrhosis (HCC)    Coronary artery disease    H/O ETOH abuse 08/11/2022   H/O: upper GI bleed 07/05/2022   Heart failure with reduced ejection fraction (HCC)    Iron deficiency anemia due to chronic blood loss 07/07/2022   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(HCC)    Protein-calorie malnutrition, severe 05/21/2021    Medications Ordered Prior to Encounter[1]  Family History  Family history unknown: Yes    Social History   Socioeconomic History   Marital status: Single    Spouse name: Not on file   Number of children: Not on file   Years of education: Not on file   Highest education level: Not on file  Occupational History   Not on file  Tobacco Use   Smoking status: Every Day    Current packs/day: 0.25    Average packs/day: 0.3 packs/day for 40.0 years (10.0 ttl pk-yrs)    Types: Cigarettes   Smokeless tobacco: Never   Tobacco comments:    3 cigs per day  Vaping Use   Vaping status: Never Used  Substance and Sexual Activity   Alcohol use: Not Currently    Comment: weekly   Drug use: Never   Sexual activity: Not on file  Other Topics Concern   Not on file  Social History Narrative   Not on file   Social Drivers of Health   Tobacco Use: High Risk (11/25/2023)   Patient History    Smoking Tobacco Use: Every Day    Smokeless Tobacco Use: Never    Passive Exposure: Not on file  Financial Resource Strain: Medium Risk (02/19/2024)   Overall Financial Resource Strain (CARDIA)    Difficulty of Paying Living Expenses: Somewhat hard  Food Insecurity: No Food  Insecurity (08/08/2024)   Epic    Worried About Programme Researcher, Broadcasting/film/video in the Last Year: Never true    Ran Out of Food in the Last Year: Never true  Transportation Needs: No Transportation Needs (08/08/2024)   Epic    Lack of Transportation (Medical): No    Lack of Transportation (Non-Medical): No  Physical Activity: Inactive (08/08/2024)   Exercise Vital Sign    Days of Exercise per Week: 0 days    Minutes of Exercise per Session: 0 min  Stress: No Stress Concern Present (08/08/2024)   [employer] of Occupational Health - Occupational Stress Questionnaire    Feeling of Stress: Not at all  Social Connections: Socially Isolated (08/08/2024)   Social Connection and Isolation Panel    Frequency of Communication with Friends and Family: More than three times a week    Frequency of Social Gatherings with Friends and Family: Three times a week    Attends Religious Services: Never    Active Member of Clubs or Organizations: No    Attends Banker Meetings: Never    Marital Status: Never married  Intimate Partner Violence: Not At Risk (08/08/2024)   Epic    Fear of Current or Ex-Partner: No    Emotionally Abused: No    Physically  Abused: No    Sexually Abused: No  Depression (PHQ2-9): Low Risk (08/08/2024)   Depression (PHQ2-9)    PHQ-2 Score: 0  Alcohol Screen: Low Risk (08/08/2024)   Alcohol Screen    Last Alcohol Screening Score (AUDIT): 0  Housing: Unknown (08/08/2024)   Epic    Unable to Pay for Housing in the Last Year: No    Number of Times Moved in the Last Year: Not on file    Homeless in the Last Year: No  Utilities: Not At Risk (08/08/2024)   Epic    Threatened with loss of utilities: No  Health Literacy: Inadequate Health Literacy (08/08/2024)   B1300 Health Literacy    Frequency of need for help with medical instructions: Always    Review of Systems: ROS negative except for what is noted on the assessment and plan.  Vitals:   08/08/24 1540  BP: 107/62   Pulse: 75  Temp: 97.8 F (36.6 C)  TempSrc: Oral  SpO2: 94%  Weight: 104 lb 6.4 oz (47.4 kg)  Height: 5' 2 (1.575 m)    Physical Exam: Constitutional: well-appearing woman,  sitting in wheel chair, in no acute distress HENT: normocephalic atraumatic, mucous membranes moist Eyes: Jaundiced  Cardiovascular: regular rate and rhythm. No S3 appreciated  Pulmonary/Chest: normal work of breathing on room air, lungs clear to auscultation bilaterally Abdominal: soft, non-tender, non-distended,no shifting dullness or frank ascites  MSK: +1 LE edema bilaterally   Neurological: alert & oriented x 3, not confused. Skin: warm and dry,good pedal pulses  Psych: normal mood and behavior  Assessment & Plan:   Decompensated liver cirrhosis from Primary Biliary Cholangitis Patient with a history of decompensated liver cirrhosis secondary to primary biliary cholangitis complicated by ascites and prior variceal bleeding presents for follow up visit after she was seen 4 days ago.She was advised to return to follow up on her shortness of breath, constipation, LE edema and early satiety.  Of note, this pleasant patient  was diagnosed with cirrhosis in 12/2021 after presenting with hematemesis and was found to have two large esophageal varices, which were banded at that time. She was started on ursodiol  for PBC but has been unable to continue therapy due to cost (> $100/month). She has not yet established care with Gastroenterology due to insurance barriers.  At her visit last week, labs were notable for: AKI: Cr 1.64 (up from 0.81 five months prior), eGFR 33 (from 76) AST 57, ALT normal. Alkaline phosphatase 232, improved from 591 five months ago. She also reported constipation and abdominal discomfort at that time. Since starting lactulose , constipation has improved. She reports some improvement in abdominal discomfort and early satiety. Shortness of breath and swelling are improved and being  monitored.  I suspect her symptoms were likely due to medication nonadherence, as she had been without her prescribed medications for over one month prior to her presentation last week.  Significant barriers to care include medication cost and insurance limitations. Her grandson expressed frustration with medication affordability. She is able to obtain spironolactone  through the [facility] program; however, lactulose , furosemide  , and ursodiol  are not covered.  Plan: - GI referral placed (priority) - CBC - CMP - Continue Lactulose , spironolactone  and furosemide  - Reach out to pharmacy coordinator to explore medication assistance programs for Ursodiol  ,Lactulose  and Furosemide  - Return precautions given (Worsening shortness of breath, abdominal distension, confusion, GI bleeding, or decreased urine output  NB:A goals-of-care discussion was initiated previously, though the issue prompting it has since been  resolved. It remains unclear whether the prior decisions reflected the patients preferences. Given her overall health status and comorbidities, it would be appropriate to revisit goals of care at a thoughtful and appropriate time. This discussion was not initiated today due to time constraints.I recommend using a qualified vietnamese interpretor for it. It is worth knowing that, the patient has legally chosen her grandson [name] Newer as her healthcare power of attorney and documentation is scanned into the chart  Patient discussed with Dr. Lovie Drue Grow, M.D [facility] Phone: [phone] Date 08/08/2024 Time 8:46 PM      [1]  Current Outpatient Medications on File Prior to Visit  Medication Sig Dispense Refill   artificial tears (LACRILUBE) OINT ophthalmic ointment Place into both eyes at bedtime as needed for dry eyes. 3.5 g 0   carvedilol  (COREG ) 3.125 MG tablet Take 1 tablet (3.125 mg total) by mouth 2 (two) times daily. 90 tablet 0   Cholecalciferol   (VITAMIN D3) 125 MCG (5000 UT) CAPS Take 1 capsule (5,000 Units total) by mouth daily. 100 capsule 0   diclofenac  Sodium (VOLTAREN  ARTHRITIS PAIN) 1 % GEL Apply 4 grams topically 4 (four) times daily. 100 g 3   furosemide  (LASIX ) 20 MG tablet Take 1 tablet (20 mg total) by mouth daily. 90 tablet 2   Lactulose  20 GM/30ML SOLN Take 15 mLs (10 g total) by mouth 2 (two) times daily as needed. Take for a goal of 2 bowel movements per day. 900 mL 11   lidocaine  (LIDODERM ) 5 % Place 1 patch onto the skin daily. Remove & Discard patch within 12 hours or as directed by MD 5 patch 3   mirtazapine  (REMERON ) 7.5 MG tablet Take 1 tablet (7.5 mg total) by mouth at bedtime. 30 tablet 0   pantoprazole  (PROTONIX ) 40 MG tablet Take 1 tablet (40 mg total) by mouth daily. 90 tablet 3   ursodiol  (ACTIGALL ) 300 MG capsule Take 2 capsules (600 mg total) by mouth 2 (two) times daily. 120 capsule 0   No current facility-administered medications on file prior to visit.   "

## 2024-08-09 ENCOUNTER — Other Ambulatory Visit: Payer: Self-pay

## 2024-08-09 ENCOUNTER — Other Ambulatory Visit (HOSPITAL_COMMUNITY): Payer: Self-pay

## 2024-08-09 ENCOUNTER — Ambulatory Visit: Payer: Self-pay | Admitting: Student

## 2024-08-09 LAB — COMPREHENSIVE METABOLIC PANEL WITH GFR
ALT: 20 IU/L (ref 0–32)
AST: 54 IU/L — ABNORMAL HIGH (ref 0–40)
Albumin: 2.5 g/dL — ABNORMAL LOW (ref 3.8–4.8)
Alkaline Phosphatase: 232 IU/L — ABNORMAL HIGH (ref 49–135)
BUN/Creatinine Ratio: 13 (ref 12–28)
BUN: 19 mg/dL (ref 8–27)
Bilirubin Total: 7.6 mg/dL — ABNORMAL HIGH (ref 0.0–1.2)
CO2: 20 mmol/L (ref 20–29)
Calcium: 8.7 mg/dL (ref 8.7–10.3)
Chloride: 105 mmol/L (ref 96–106)
Creatinine, Ser: 1.42 mg/dL — ABNORMAL HIGH (ref 0.57–1.00)
Globulin, Total: 4.2 g/dL (ref 1.5–4.5)
Glucose: 134 mg/dL — ABNORMAL HIGH (ref 70–99)
Potassium: 3 mmol/L — ABNORMAL LOW (ref 3.5–5.2)
Sodium: 140 mmol/L (ref 134–144)
Total Protein: 6.7 g/dL (ref 6.0–8.5)
eGFR: 39 mL/min/1.73 — ABNORMAL LOW

## 2024-08-09 LAB — CBC WITH DIFFERENTIAL/PLATELET
Basophils Absolute: 0 x10E3/uL (ref 0.0–0.2)
Basos: 0 %
EOS (ABSOLUTE): 0.2 x10E3/uL (ref 0.0–0.4)
Eos: 3 %
Hematocrit: 21.9 % — ABNORMAL LOW (ref 34.0–46.6)
Hemoglobin: 7.7 g/dL — ABNORMAL LOW (ref 11.1–15.9)
Immature Grans (Abs): 0 x10E3/uL (ref 0.0–0.1)
Immature Granulocytes: 0 %
Lymphocytes Absolute: 1.7 x10E3/uL (ref 0.7–3.1)
Lymphs: 22 %
MCH: 30.3 pg (ref 26.6–33.0)
MCHC: 35.2 g/dL (ref 31.5–35.7)
MCV: 86 fL (ref 79–97)
Monocytes Absolute: 0.6 x10E3/uL (ref 0.1–0.9)
Monocytes: 8 %
Neutrophils Absolute: 5.2 x10E3/uL (ref 1.4–7.0)
Neutrophils: 67 %
Platelets: 138 x10E3/uL — ABNORMAL LOW (ref 150–450)
RBC: 2.54 x10E6/uL — CL (ref 3.77–5.28)
RDW: 14.9 % (ref 11.7–15.4)
WBC: 7.8 x10E3/uL (ref 3.4–10.8)

## 2024-08-09 MED ORDER — POTASSIUM CHLORIDE CRYS ER 20 MEQ PO TBCR
20.0000 meq | EXTENDED_RELEASE_TABLET | Freq: Two times a day (BID) | ORAL | 0 refills | Status: AC
  Filled 2024-08-09: qty 10, 5d supply, fill #0

## 2024-08-09 NOTE — Progress Notes (Signed)
 Internal Medicine Clinic Attending  Case discussed with the resident at the time of the visit.  We reviewed the residents history and exam and pertinent patient test results.  I agree with the assessment, diagnosis, and plan of care documented in the residents note.    I'm worried about this sick patient.  She has been feeling better since restarting her lasix  & spironolactone  last week.  Lower extremity swelling has improved. Full set of labs are pending, but GFR is improving some from last week after starting these medicines.   I agree with urgent GI referral We will work with our pharmacy team in the [facility] to get her affordable medicines, including ursodiol 

## 2024-08-09 NOTE — Progress Notes (Signed)
 I spoke with Hannah Blake grandson, who is also her healthcare power of attorney, to review her recent laboratory results. Laboratory Findings: CBC: Compared to 5 days ago, values are relatively stable: hemoglobin 7.7 g/dL, hematocrit 78.0%, RBC 2.54 (previous 2.52). Platelets slightly decreased from 141 ? 138 . There are no signs of active bleeding at this time. Renal function: Creatinine improved slightly from 1.64 ? 1.42 mg/dL. Electrolytes: Potassium decreased from 3.6 ? 3.0 , likely related to recent initiation of Lasix  although on spironolactone  Liver function / albumin : Albumin  remains low at 2.5 g/dL. AST slightly improved from 57 ? 54 U/L. Alkaline phosphatase remains elevated at 2.32. Medication Assistance  I contacted [facility] regarding potential assistance for Lasix , lactulose , and ursodiol . No support is currently available, as previously documented. Lasix  can be obtained at a cost of $9 if filled for a 30-day supply.  Plan: - It has been a challenging few years for this patient. She requires evaluation by a GI specialist as soon as possible; however, financial constraints may continue to be a barrier to care. I have provided financial assistance resources.Overall prognosis remains guarded. - Initiate a short course of oral potassium chloride  with repeat laboratory monitoring.Will give potassium chloride  20 mEq twice daily for 3-5 days (this is available through [facility]) - Educated patient and caregiver on signs of severe hypokalemia that warrant immediate evaluation, including severe muscle weakness or cramps, hyporeflexia, or paralysis.

## 2024-08-09 NOTE — Progress Notes (Signed)
 Internal Medicine Clinic Attending  Case discussed with the resident at the time of the visit.  We reviewed the resident's history and exam and pertinent patient test results.  I agree with the assessment, diagnosis, and plan of care documented in the resident's note.

## 2024-08-10 NOTE — Progress Notes (Signed)
 It appears she could get Ursodiol  for $25 a month at [facility] or [place] with a goodrx coupon

## 2024-08-22 ENCOUNTER — Other Ambulatory Visit: Payer: Self-pay
